# Patient Record
Sex: Male | Born: 1940 | Race: White | Hispanic: No | Smoking: Former smoker
Health system: Southern US, Community
[De-identification: ages and names within clinical notes are randomized; demographics above are authoritative.]

## PROBLEM LIST (undated history)

## (undated) DIAGNOSIS — D369 Benign neoplasm, unspecified site: Secondary | ICD-10-CM

## (undated) DIAGNOSIS — K635 Polyp of colon: Secondary | ICD-10-CM

## (undated) DIAGNOSIS — M1990 Unspecified osteoarthritis, unspecified site: Secondary | ICD-10-CM

## (undated) DIAGNOSIS — C20 Malignant neoplasm of rectum: Secondary | ICD-10-CM

## (undated) DIAGNOSIS — K469 Unspecified abdominal hernia without obstruction or gangrene: Secondary | ICD-10-CM

## (undated) DIAGNOSIS — M25512 Pain in left shoulder: Secondary | ICD-10-CM

## (undated) DIAGNOSIS — J449 Chronic obstructive pulmonary disease, unspecified: Secondary | ICD-10-CM

## (undated) DIAGNOSIS — H269 Unspecified cataract: Secondary | ICD-10-CM

## (undated) DIAGNOSIS — R319 Hematuria, unspecified: Secondary | ICD-10-CM

## (undated) DIAGNOSIS — R251 Tremor, unspecified: Secondary | ICD-10-CM

## (undated) DIAGNOSIS — H3530 Unspecified macular degeneration: Secondary | ICD-10-CM

## (undated) DIAGNOSIS — K5790 Diverticulosis of intestine, part unspecified, without perforation or abscess without bleeding: Secondary | ICD-10-CM

## (undated) DIAGNOSIS — I10 Essential (primary) hypertension: Secondary | ICD-10-CM

## (undated) DIAGNOSIS — Z8619 Personal history of other infectious and parasitic diseases: Secondary | ICD-10-CM

## (undated) DIAGNOSIS — Z8719 Personal history of other diseases of the digestive system: Secondary | ICD-10-CM

## (undated) HISTORY — DX: Chronic obstructive pulmonary disease, unspecified: J44.9

## (undated) HISTORY — PX: HERNIA REPAIR: SHX51

## (undated) HISTORY — DX: Polyp of colon: K63.5

## (undated) HISTORY — DX: Tremor, unspecified: R25.1

## (undated) HISTORY — PX: COLON SURGERY: SHX602

## (undated) HISTORY — PX: EYE SURGERY: SHX253

## (undated) HISTORY — DX: Unspecified osteoarthritis, unspecified site: M19.90

## (undated) HISTORY — DX: Pain in left shoulder: M25.512

## (undated) HISTORY — DX: Personal history of other infectious and parasitic diseases: Z86.19

## (undated) HISTORY — DX: Unspecified macular degeneration: H35.30

## (undated) HISTORY — DX: Unspecified abdominal hernia without obstruction or gangrene: K46.9

## (undated) HISTORY — DX: Unspecified cataract: H26.9

## (undated) HISTORY — PX: OTHER SURGICAL HISTORY: SHX169

## (undated) HISTORY — PX: APPENDECTOMY: SHX54

## (undated) HISTORY — DX: Benign neoplasm, unspecified site: D36.9

## (undated) HISTORY — DX: Malignant neoplasm of rectum: C20

## (undated) HISTORY — DX: Diverticulosis of intestine, part unspecified, without perforation or abscess without bleeding: K57.90

## (undated) HISTORY — PX: TONSILLECTOMY: SUR1361

## (undated) HISTORY — DX: Essential (primary) hypertension: I10

## (undated) HISTORY — DX: Personal history of other diseases of the digestive system: Z87.19

## (undated) HISTORY — DX: Hematuria, unspecified: R31.9

---

## 1992-09-29 DIAGNOSIS — C20 Malignant neoplasm of rectum: Secondary | ICD-10-CM

## 1992-09-29 HISTORY — DX: Malignant neoplasm of rectum: C20

## 2013-12-20 ENCOUNTER — Telehealth: Payer: Self-pay | Admitting: Gastroenterology

## 2013-12-20 NOTE — Telephone Encounter (Signed)
Received 6 pages from [facility], sent to Dr. Deatra Ina on 12/20/13/ss.

## 2014-01-10 ENCOUNTER — Encounter: Payer: Self-pay | Admitting: Gastroenterology

## 2014-01-27 DIAGNOSIS — Z8619 Personal history of other infectious and parasitic diseases: Secondary | ICD-10-CM

## 2014-01-27 HISTORY — DX: Personal history of other infectious and parasitic diseases: Z86.19

## 2014-03-01 ENCOUNTER — Ambulatory Visit: Payer: Self-pay | Admitting: Gastroenterology

## 2014-04-13 ENCOUNTER — Ambulatory Visit: Payer: Self-pay | Admitting: Family Medicine

## 2014-04-18 ENCOUNTER — Telehealth: Payer: Self-pay

## 2014-04-18 NOTE — Telephone Encounter (Signed)
Medication List and allergies:  Reviewed and updated  90 day supply/mail order:  [facility] - [place], [address] Local prescriptions: [id number] [place],  - [place]. AT [place]  Immunization due:  See below  A/P: Personal, family and PSH: Reviewed and updated Tdap- unsure of last vaccine PNA- 2009 Shingles- 2009 CCS- 01/03/13- multiple polyps and diverticular disease; repeat colonoscopy in 1 year--OVERDUE; Dr. Deatra Ina  PSA- not on file    To discuss with provider: Nothing at this time.

## 2014-04-18 NOTE — Telephone Encounter (Signed)
Pt called, call back [phone]

## 2014-04-18 NOTE — Telephone Encounter (Signed)
Left message for call back Non-identifiable  Td-DUE PNA- DUE Z- DUE CCS- 01/03/13- multiple polyps and diverticular disease; repeat colonoscopy in 1 year--OVERDUE; Dr. Deatra Ina PSA- not on file

## 2014-04-19 ENCOUNTER — Ambulatory Visit (INDEPENDENT_AMBULATORY_CARE_PROVIDER_SITE_OTHER): Payer: Medicare HMO | Admitting: Family Medicine

## 2014-04-19 ENCOUNTER — Encounter: Payer: Self-pay | Admitting: Family Medicine

## 2014-04-19 VITALS — BP 128/80 | HR 83 | Temp 98.2°F | Resp 16 | Ht 73.0 in | Wt 226.0 lb

## 2014-04-19 DIAGNOSIS — F32A Depression, unspecified: Secondary | ICD-10-CM

## 2014-04-19 DIAGNOSIS — F329 Major depressive disorder, single episode, unspecified: Secondary | ICD-10-CM | POA: Insufficient documentation

## 2014-04-19 DIAGNOSIS — F3289 Other specified depressive episodes: Secondary | ICD-10-CM

## 2014-04-19 DIAGNOSIS — F419 Anxiety disorder, unspecified: Secondary | ICD-10-CM

## 2014-04-19 MED ORDER — FLUOXETINE HCL 20 MG PO TABS: 20.0000 mg | ORAL_TABLET | Freq: Every day | ORAL | Status: DC

## 2014-04-19 NOTE — Progress Notes (Signed)
Pre visit review using our clinic review tool, if applicable. No additional management support is needed unless otherwise documented below in the visit note. 

## 2014-04-19 NOTE — Progress Notes (Signed)
   Subjective:    Patient ID: Devin [name], male    DOB: 03-12-1941, 73 y.o.   MRN: [id number]  HPI New to establish.  Previous MD- Nancy Fetter Romelle Starcher)  Depression- pt's son died suddenly and wife decided to move to [place].  Pt misses [place] and his close group of friends.  On Lexapro but not taking daily 'i felt weird', Nortriptyline.  Taking hydroxyzine as needed.  Pt was a bell ringer and 'I miss that'.  Admits to being short w/ wife.  Macular degeneration- chronic problem, following w/ ophtho regularly.  ([place])   Review of Systems For ROS see HPI     Objective:   Physical Exam  Vitals reviewed. Constitutional: He is oriented to person, place, and time. He appears well-developed and well-nourished. No distress.  HENT:  Head: Normocephalic and atraumatic.  Neck: Normal range of motion. Neck supple. No thyromegaly present.  Cardiovascular: Normal rate, regular rhythm, normal heart sounds and intact distal pulses.   Pulmonary/Chest: Effort normal and breath sounds normal. No respiratory distress. He has no wheezes. He has no rales.  Musculoskeletal: He exhibits no edema.  Lymphadenopathy:    He has no cervical adenopathy.  Neurological: He is alert and oriented to person, place, and time.  Skin: Skin is warm and dry. No erythema.  Psychiatric: He has a normal mood and affect. His behavior is normal. Thought content normal.          Assessment & Plan:

## 2014-04-19 NOTE — Patient Instructions (Signed)
Follow up in 4-6 weeks to recheck mood STOP the Lexapro (escitalopram) START the Fluoxetine daily in the AM w/ food Please call if you have any side effects, questions or concerns Welcome!  We're glad to have you- it was delightful meeting you! Enjoy the rest of your summer!

## 2014-04-23 NOTE — Assessment & Plan Note (Signed)
New to provider, ongoing for pt.  Recently lost son suddenly and unexpectedly and then left everything familiar to him and moved from [place] to [place].  Pt did not do well on Lexapro.  Will attempt to switch to Prozac and monitor for improvement in sxs.  Will follow closely.

## 2014-05-15 ENCOUNTER — Ambulatory Visit (INDEPENDENT_AMBULATORY_CARE_PROVIDER_SITE_OTHER): Payer: Medicare HMO | Admitting: Gastroenterology

## 2014-05-15 ENCOUNTER — Encounter: Payer: Self-pay | Admitting: Gastroenterology

## 2014-05-15 VITALS — BP 146/98 | HR 84 | Ht 73.0 in | Wt 225.4 lb

## 2014-05-15 DIAGNOSIS — Z860100 Personal history of colon polyps, unspecified: Secondary | ICD-10-CM | POA: Insufficient documentation

## 2014-05-15 DIAGNOSIS — Z85048 Personal history of other malignant neoplasm of rectum, rectosigmoid junction, and anus: Secondary | ICD-10-CM

## 2014-05-15 DIAGNOSIS — K5730 Diverticulosis of large intestine without perforation or abscess without bleeding: Secondary | ICD-10-CM | POA: Insufficient documentation

## 2014-05-15 DIAGNOSIS — Z800 Family history of malignant neoplasm of digestive organs: Secondary | ICD-10-CM | POA: Insufficient documentation

## 2014-05-15 MED ORDER — PEG-KCL-NACL-NASULF-NA ASC-C 100 G PO SOLR: 1.0000 | Freq: Once | ORAL | Status: DC

## 2014-05-15 NOTE — Assessment & Plan Note (Signed)
Last year his colonoscopy described in in adequate prep.  Plan to repeat colonoscopy with prep modification.

## 2014-05-15 NOTE — Progress Notes (Signed)
_                                                                                                                History of Present Illness:  Devin [name] is a pleasant 73 year old white male with history of colon cancer and colon polyps referred for followup colonoscopy.  Colon cancer was resected about 15 years ago.  Colonoscopy last year demonstrated at least 7 polyps.  The 7 mm cecal polyp was adenomatous.  The other polyps were hyperplastic. Prep was described as inadequate and one year followup was recommended.  Diverticulosis was seen as well.  The patient has no GI complaints  Past Medical History  Diagnosis Date  . Tubular adenoma     01/05/2013  . Hyperplastic colon polyp   . Rectal cancer   . Diverticulosis   . Macular degeneration   . Hernia of abdominal cavity   . History of rectal polyps   . Hypertension   . COPD (chronic obstructive pulmonary disease)    Past Surgical History  Procedure Laterality Date  . Mastoid gland removal      left ear  . Appendectomy    . Hernia repair    . Colon surgery     family history includes Aneurysm in his father and son; Breast cancer in his mother; Cancer in his maternal grandmother, mother, and paternal grandmother; Heart attack in his father; Stroke in his paternal grandfather. Current Outpatient Prescriptions  Medication Sig Dispense Refill  . aspirin 81 MG tablet Take 81 mg by mouth daily.      . B Complex-C (B-COMPLEX WITH VITAMIN C) tablet Take 1 tablet by mouth daily.      . calcium carbonate (OS-CAL) 600 MG TABS tablet Take 600 mg by mouth daily with breakfast. + vitamin D      . Cyanocobalamin (VITAMIN B-12) 2000 MCG TBCR Take 1 tablet by mouth daily.      Marland Kitchen FLUoxetine (PROZAC) 20 MG tablet Take 1 tablet (20 mg total) by mouth daily.  30 tablet  3  . hydrOXYzine (ATARAX/VISTARIL) 25 MG tablet Take 25 mg by mouth as needed.      . Lutein 6 MG CAPS Take 1 capsule by mouth daily.      . nortriptyline (PAMELOR) 50 MG  capsule Take 1 capsule by mouth at bedtime.       No current facility-administered medications for this visit.   Allergies as of 05/15/2014  . (No Known Allergies)    reports that he quit smoking about 25 years ago. He has never used smokeless tobacco. He reports that he drinks alcohol. He reports that he does not use illicit drugs.   Review of Systems: Pertinent positive and negative review of systems were noted in the above HPI section. All other review of systems were otherwise negative.  Vital signs were reviewed in today's medical record Physical Exam: General: Well developed , well nourished, no acute distress Skin: anicteric Head: Normocephalic and atraumatic Eyes:  sclerae anicteric, EOMI  Ears: Normal auditory acuity Mouth: No deformity or lesions Neck: Supple, no masses or thyromegaly Lungs: Clear throughout to auscultation Heart: Regular rate and rhythm; no murmurs, rubs or bruits Abdomen: Soft, non tender and non distended. No masses, hepatosplenomegaly or hernias noted. Normal Bowel sounds Rectal:deferred Musculoskeletal: Symmetrical with no gross deformities  Skin: No lesions on visible extremities Pulses:  Normal pulses noted Extremities: No clubbing, cyanosis, edema or deformities noted Neurological: Alert oriented x 4, grossly nonfocal Cervical Nodes:  No significant cervical adenopathy Inguinal Nodes: No significant inguinal adenopathy Psychological:  Alert and cooperative. Normal mood and affect  See Assessment and Plan under Problem List

## 2014-05-15 NOTE — Patient Instructions (Signed)

## 2014-05-18 ENCOUNTER — Encounter: Payer: Self-pay | Admitting: Gastroenterology

## 2014-05-18 ENCOUNTER — Ambulatory Visit (AMBULATORY_SURGERY_CENTER): Payer: Medicare HMO | Admitting: Gastroenterology

## 2014-05-18 VITALS — BP 148/85 | HR 81 | Temp 98.6°F | Resp 16 | Ht 73.0 in | Wt 225.0 lb

## 2014-05-18 DIAGNOSIS — Z85048 Personal history of other malignant neoplasm of rectum, rectosigmoid junction, and anus: Secondary | ICD-10-CM

## 2014-05-18 DIAGNOSIS — K5730 Diverticulosis of large intestine without perforation or abscess without bleeding: Secondary | ICD-10-CM

## 2014-05-18 DIAGNOSIS — D126 Benign neoplasm of colon, unspecified: Secondary | ICD-10-CM

## 2014-05-18 MED ORDER — SODIUM CHLORIDE 0.9 % IV SOLN: 500.0000 mL | INTRAVENOUS | Status: DC

## 2014-05-18 NOTE — Progress Notes (Signed)
A/ox3 pleased with MAC, report to Celia RN 

## 2014-05-18 NOTE — Op Note (Addendum)
[place]  [address]. [place], [address]   COLONOSCOPY PROCEDURE REPORT  PATIENT: Devin [name], Devin [name]  MR#: [id number] BIRTHDATE: 11/23/1940 , 36  yrs. old GENDER: Male ENDOSCOPIST: Inda Castle, MD REFERRED [id number] Wadie Lessen, M.D. PROCEDURE DATE:  05/18/2014 PROCEDURE:   Colonoscopy with snare polypectomy and Submucosal injection, any substance First Screening Colonoscopy - Avg.  risk and is 50 yrs.  old or older - No.  Prior Negative Screening - Now for repeat screening. N/A  History of Adenoma - Now for follow-up colonoscopy & has been > or = to 3 yrs.  N/A History of Adenoma - Now for follow-up colonoscopy & has been > or = to 3 yrs.  No.  It has been less than 3 yrs since last colonoscopy.  Medical reason.  Polyps Removed Today? Yes. ASA CLASS:   Class II INDICATIONS:Patient's personal history of adenomatous colon polyps,  MEDICATIONS: MAC sedation, administered by CRNA and propofol (Diprivan) 300mg  IV  DESCRIPTION OF PROCEDURE:   After the risks benefits and alternatives of the procedure were thoroughly explained, informed consent was obtained.  A digital rectal exam revealed no abnormalities of the rectum.   The LB [id number]  endoscope was introduced through the anus and advanced to the cecum, which was identified by both the appendix and ileocecal valve. No adverse events experienced.   The quality of the prep was excellent using Suprep  The instrument was then slowly withdrawn as the colon was fully examined.      COLON FINDINGS: A sessile polyp measuring 15 mm in size was found in the proximal transverse colon.  A saline Injection was given to lift the mucosal wall.  A polypectomy was performed using snare cautery.  The resection was complete and the polyp tissue was completely retrieved.   A sessile polyp measuring 4 mm in size was found in the distal transverse colon.  A polypectomy was performed with a cold snare.  The resection  was complete and the polyp tissue was completely retrieved.   There was severe diverticulosis noted in the sigmoid with associated muscular hypertrophy and colonic narrowing.   There was moderate scattered diverticulosis noted in the descending colon, transverse colon, ascending colon, and at the cecum.   The colon was otherwise normal.  There was no diverticulosis, inflammation, polyps or cancers unless previously stated.  Retroflexed views revealed no abnormalities. The time to cecum=5 minutes 12 seconds.  Withdrawal time=14 minutes 38 seconds. The scope was withdrawn and the procedure completed. COMPLICATIONS: There were no complications.  ENDOSCOPIC IMPRESSION: 1.   Sessile polyp measuring 15 mm in size was found in the proximal transverse colon; saline was given to lift the mucosal wall.; polypectomy was performed using snare cautery 2.   Sessile polyp measuring 4 mm in size was found in the distal transverse colon; polypectomy was performed with a cold snare 3.   There was severe diverticulosis noted in the sigmoid 4.   There was moderate diverticulosis noted in the descending colon, transverse colon, ascending colon, and at the cecum 5.   The colon was otherwise normal  RECOMMENDATIONS: If the polyp(s) removed today are proven to be adenomatous (pre-cancerous) polyps, you will need a colonoscopy in 3 years. Otherwise you should continue to follow colorectal cancer screening guidelines for "routine risk" patients with a colonoscopy in 10 years.  You will receive a letter within 1-2 weeks with the results of your biopsy as well as final recommendations.  Please call my  office if you have not received a letter after 3 weeks.  eSigned:  Inda Castle, MD 05/24/2014 4:39 PM Revised: 05/24/2014 4:39 PM  cc:   PATIENT NAME:  Misha, Antonini MR#: [id number]

## 2014-05-18 NOTE — Patient Instructions (Signed)
Discharge instructions given with verbal understanding. Handouts on polyps,diverticulosis and hemorrhoids. Resume previous medications. YOU HAD AN ENDOSCOPIC PROCEDURE TODAY AT THE [facility]: Refer to the procedure report that was given to you for any specific questions about what was found during the examination.  If the procedure report does not answer your questions, please call your gastroenterologist to clarify.  If you requested that your care partner not be given the details of your procedure findings, then the procedure report has been included in a sealed envelope for you to review at your convenience later.  YOU SHOULD EXPECT: Some feelings of bloating in the abdomen. Passage of more gas than usual.  Walking can help get rid of the air that was put into your GI tract during the procedure and reduce the bloating. If you had a lower endoscopy (such as a colonoscopy or flexible sigmoidoscopy) you may notice spotting of blood in your stool or on the toilet paper. If you underwent a bowel prep for your procedure, then you may not have a normal bowel movement for a few days.  DIET: Your first meal following the procedure should be a light meal and then it is ok to progress to your normal diet.  A half-sandwich or bowl of soup is an example of a good first meal.  Heavy or fried foods are harder to digest and may make you feel nauseous or bloated.  Likewise meals heavy in dairy and vegetables can cause extra gas to form and this can also increase the bloating.  Drink plenty of fluids but you should avoid alcoholic beverages for 24 hours.  ACTIVITY: Your care partner should take you home directly after the procedure.  You should plan to take it easy, moving slowly for the rest of the day.  You can resume normal activity the day after the procedure however you should NOT DRIVE or use heavy machinery for 24 hours (because of the sedation medicines used during the test).    SYMPTOMS TO REPORT  IMMEDIATELY: A gastroenterologist can be reached at any hour.  During normal business hours, 8:30 AM to 5:00 PM Monday through Friday, call [phone].  After hours and on weekends, please call the GI answering service at [phone] who will take a message and have the physician on call contact you.   Following lower endoscopy (colonoscopy or flexible sigmoidoscopy):  Excessive amounts of blood in the stool  Significant tenderness or worsening of abdominal pains  Swelling of the abdomen that is new, acute  Fever of 100F or higher  FOLLOW UP: If any biopsies were taken you will be contacted by phone or by letter within the next 1-3 weeks.  Call your gastroenterologist if you have not heard about the biopsies in 3 weeks.  Our staff will call the home number listed on your records the next business day following your procedure to check on you and address any questions or concerns that you may have at that time regarding the information given to you following your procedure. This is a courtesy call and so if there is no answer at the home number and we have not heard from you through the emergency physician on call, we will assume that you have returned to your regular daily activities without incident.  SIGNATURES/CONFIDENTIALITY: You and/or your care partner have signed paperwork which will be entered into your electronic medical record.  These signatures attest to the fact that that the information above on your After Visit Summary   has been reviewed and is understood.  Full responsibility of the confidentiality of this discharge information lies with you and/or your care-partner. 

## 2014-05-18 NOTE — Progress Notes (Signed)
Called to room to assist during endoscopic procedure.  Patient ID and intended procedure confirmed with present staff. Received instructions for my participation in the procedure from the performing physician.  

## 2014-05-19 ENCOUNTER — Telehealth: Payer: Self-pay

## 2014-05-19 NOTE — Telephone Encounter (Signed)
  Follow up Call-  Call back number 05/18/2014  Post procedure Call Back phone  # [phone]  Permission to leave phone message Yes     Patient questions:  Do you have a fever, pain , or abdominal swelling? No. Pain Score  0 *  Have you tolerated food without any problems? Yes.    Have you been able to return to your normal activities? Yes.    Do you have any questions about your discharge instructions: Diet   No. Medications  No. Follow up visit  No.  Do you have questions or concerns about your Care? No.  Actions: * If pain score is 4 or above: No action needed, pain <4.  Per the pt. "everything was splendid". maw

## 2014-05-24 ENCOUNTER — Encounter: Payer: Self-pay | Admitting: Gastroenterology

## 2014-05-25 ENCOUNTER — Ambulatory Visit: Payer: Medicare HMO | Admitting: Family Medicine

## 2014-05-25 ENCOUNTER — Encounter: Payer: Self-pay | Admitting: Family Medicine

## 2014-05-25 ENCOUNTER — Ambulatory Visit (INDEPENDENT_AMBULATORY_CARE_PROVIDER_SITE_OTHER): Payer: Medicare HMO | Admitting: Family Medicine

## 2014-05-25 VITALS — BP 140/76 | HR 81 | Temp 98.0°F | Resp 16 | Wt 221.4 lb

## 2014-05-25 DIAGNOSIS — F329 Major depressive disorder, single episode, unspecified: Secondary | ICD-10-CM

## 2014-05-25 DIAGNOSIS — R079 Chest pain, unspecified: Secondary | ICD-10-CM

## 2014-05-25 DIAGNOSIS — F32A Depression, unspecified: Secondary | ICD-10-CM

## 2014-05-25 DIAGNOSIS — R030 Elevated blood-pressure reading, without diagnosis of hypertension: Secondary | ICD-10-CM

## 2014-05-25 DIAGNOSIS — K219 Gastro-esophageal reflux disease without esophagitis: Secondary | ICD-10-CM | POA: Insufficient documentation

## 2014-05-25 DIAGNOSIS — F3289 Other specified depressive episodes: Secondary | ICD-10-CM

## 2014-05-25 DIAGNOSIS — R072 Precordial pain: Secondary | ICD-10-CM

## 2014-05-25 DIAGNOSIS — IMO0001 Reserved for inherently not codable concepts without codable children: Secondary | ICD-10-CM

## 2014-05-25 MED ORDER — OMEPRAZOLE 20 MG PO CPDR: 20.0000 mg | DELAYED_RELEASE_CAPSULE | Freq: Every day | ORAL | Status: DC

## 2014-05-25 MED ORDER — NORTRIPTYLINE HCL 50 MG PO CAPS: 50.0000 mg | ORAL_CAPSULE | Freq: Every day | ORAL | Status: DC

## 2014-05-25 NOTE — Assessment & Plan Note (Signed)
New.  Atypical for cardiac chest pain but due to inverted T waves in V1-3 and no previous to compare, will refer to cards.  Suspect this is due to GERD.  Will start PPI.  Reviewed supportive care and red flags that should prompt return.  Pt expressed understanding and is in agreement w/ plan.

## 2014-05-25 NOTE — Progress Notes (Signed)
   Subjective:    Patient ID: Devin [name], male    DOB: [birth year], 73 y.o.   MRN: [id number]  HPI Depression- pt was started on Prozac at last visit and pt feels this is working to improve mood.  Pt would like to continue.  Pt has been taking Nortriptyline 50mg  for Migraine prophylaxis- pharmacy mistakenly dropped script to 25mg  at last refill.  Elevated BP- pt reports BPs are labile.  Denies SOB, HAs, edema.  CP- pt reports pain improves w/ belching.  Occuring overnight.  Taking Zantac w/ some relief.  Eating Raddishes and seltzer water.  Sleeping w/ head of bed elevated.  No pattern to chest pain.  Pain extends across anterior chest.  Pt doesn't feel this is heart pain- not radiating to neck or shoulder.  'worst kind of indigestion you can have'.   Review of Systems For ROS see HPI     Objective:   Physical Exam  Vitals reviewed. Constitutional: He is oriented to person, place, and time. He appears well-developed and well-nourished. No distress.  HENT:  Head: Normocephalic and atraumatic.  Eyes: Conjunctivae and EOM are normal. Pupils are equal, round, and reactive to light.  Neck: Normal range of motion. Neck supple. No thyromegaly present.  Cardiovascular: Normal rate, regular rhythm, normal heart sounds and intact distal pulses.   No murmur heard. Pulmonary/Chest: Effort normal and breath sounds normal. No respiratory distress.  Abdominal: Soft. Bowel sounds are normal. He exhibits no distension.  Musculoskeletal: He exhibits no edema.  Lymphadenopathy:    He has no cervical adenopathy.  Neurological: He is alert and oriented to person, place, and time. No cranial nerve deficit.  Skin: Skin is warm and dry.  Psychiatric: He has a normal mood and affect. His behavior is normal.          Assessment & Plan:

## 2014-05-25 NOTE — Assessment & Plan Note (Signed)
Improved since starting Prozac.  Will continue at this time.

## 2014-05-25 NOTE — Progress Notes (Signed)
Pre visit review using our clinic review tool, if applicable. No additional management support is needed unless otherwise documented below in the visit note. 

## 2014-05-25 NOTE — Assessment & Plan Note (Signed)
Deteriorated.  Will stop pt's Zantac and start PPI.  Suspect this is cause for pt's CP.  Will monitor closely.

## 2014-05-25 NOTE — Patient Instructions (Signed)
Schedule your complete physical in 6 months We'll call you with your cardiology appt Continue the Prozac daily Start the Omeprazole daily to reduce acid production and decrease indigestion Call with any questions or concerns Happy Labor Day!

## 2014-05-25 NOTE — Assessment & Plan Note (Signed)
New.  Only mildly elevated today.  No need to start meds but will monitor closely for changes.

## 2014-06-21 ENCOUNTER — Encounter: Payer: Self-pay | Admitting: Family Medicine

## 2014-06-21 ENCOUNTER — Ambulatory Visit (INDEPENDENT_AMBULATORY_CARE_PROVIDER_SITE_OTHER): Payer: Medicare HMO | Admitting: Family Medicine

## 2014-06-21 VITALS — BP 130/82 | HR 84 | Temp 98.1°F | Resp 16 | Wt 225.0 lb

## 2014-06-21 DIAGNOSIS — J209 Acute bronchitis, unspecified: Secondary | ICD-10-CM | POA: Insufficient documentation

## 2014-06-21 DIAGNOSIS — J208 Acute bronchitis due to other specified organisms: Secondary | ICD-10-CM

## 2014-06-21 MED ORDER — GUAIFENESIN-CODEINE 100-10 MG/5ML PO SYRP: 10.0000 mL | ORAL_SOLUTION | Freq: Three times a day (TID) | ORAL | Status: DC | PRN

## 2014-06-21 MED ORDER — AMOXICILLIN 875 MG PO TABS: 875.0000 mg | ORAL_TABLET | Freq: Two times a day (BID) | ORAL | Status: DC

## 2014-06-21 MED ORDER — BENZONATATE 200 MG PO CAPS: 200.0000 mg | ORAL_CAPSULE | Freq: Three times a day (TID) | ORAL | Status: DC | PRN

## 2014-06-21 NOTE — Progress Notes (Signed)
   Subjective:    Patient ID: Devin [name], male    DOB: 1941/09/07, 73 y.o.   MRN: [id number]  HPI URI- sxs started 5 days ago w/ 'hacking cough'.  Pt has hx of 'walking PNA'.  No fevers.  + maxillary sinus pain.  No ear pain.  Cough is productive during the day and dry at night.  + sick contacts on cruise.   Review of Systems For ROS see HPI     Objective:   Physical Exam  Vitals reviewed. Constitutional: He appears well-developed and well-nourished. No distress.  HENT:  Head: Normocephalic and atraumatic.  Right Ear: Tympanic membrane normal.  Left Ear: Tympanic membrane normal.  Nose: No mucosal edema or rhinorrhea. Right sinus exhibits no maxillary sinus tenderness and no frontal sinus tenderness. Left sinus exhibits no maxillary sinus tenderness and no frontal sinus tenderness.  Mouth/Throat: Mucous membranes are normal. No oropharyngeal exudate, posterior oropharyngeal edema or posterior oropharyngeal erythema.  Eyes: Conjunctivae and EOM are normal. Pupils are equal, round, and reactive to light.  Neck: Normal range of motion. Neck supple.  Cardiovascular: Normal rate, regular rhythm and normal heart sounds.   Pulmonary/Chest: Effort normal. No respiratory distress. He has no wheezes.  + hacking cough w/ questionable crackles of RLL  Lymphadenopathy:    He has no cervical adenopathy.  Skin: Skin is warm and dry.          Assessment & Plan:

## 2014-06-21 NOTE — Progress Notes (Signed)
Pre visit review using our clinic review tool, if applicable. No additional management support is needed unless otherwise documented below in the visit note. 

## 2014-06-21 NOTE — Assessment & Plan Note (Signed)
New.  Pt's cough is severe and he has faint crackles in RLL.  Due to hx of walking PNA and recent travel, will treat w/ abx.  Cough meds prn.  Reviewed supportive care and red flags that should prompt return.  Pt expressed understanding and is in agreement w/ plan.

## 2014-06-21 NOTE — Patient Instructions (Signed)
Follow up as needed Start the Amoxicillin twice daily x10 days Drink plenty of fluids Use the cough syrup for nights- will cause drowsiness Use the cough pills for daytime- combine w/ Mucinex DM for better relief Call with any questions or concerns Hang in there!

## 2014-07-12 ENCOUNTER — Institutional Professional Consult (permissible substitution): Payer: Medicare HMO | Admitting: Cardiology

## 2014-07-24 ENCOUNTER — Telehealth: Payer: Self-pay | Admitting: Family Medicine

## 2014-07-24 MED ORDER — NORTRIPTYLINE HCL 50 MG PO CAPS: 50.0000 mg | ORAL_CAPSULE | Freq: Every day | ORAL | Status: DC

## 2014-07-24 MED ORDER — FLUOXETINE HCL 20 MG PO TABS: 20.0000 mg | ORAL_TABLET | Freq: Every day | ORAL | Status: DC

## 2014-07-24 MED ORDER — OMEPRAZOLE 20 MG PO CPDR: 20.0000 mg | DELAYED_RELEASE_CAPSULE | Freq: Every day | ORAL | Status: DC

## 2014-07-24 NOTE — Telephone Encounter (Signed)
Caller name: Chayim Relation to pt: Call back number:[phone] Pharmacy: [facility]  Reason for call:  Pt wants the list of his medications with refill dates to be sent to [facility] order.

## 2014-07-24 NOTE — Telephone Encounter (Signed)
meds filled

## 2014-07-27 ENCOUNTER — Ambulatory Visit (INDEPENDENT_AMBULATORY_CARE_PROVIDER_SITE_OTHER): Payer: Medicare HMO | Admitting: Family Medicine

## 2014-07-27 ENCOUNTER — Encounter: Payer: Self-pay | Admitting: Family Medicine

## 2014-07-27 VITALS — BP 138/84 | HR 83 | Temp 98.1°F | Resp 16 | Wt 231.4 lb

## 2014-07-27 DIAGNOSIS — M25561 Pain in right knee: Secondary | ICD-10-CM

## 2014-07-27 DIAGNOSIS — M25562 Pain in left knee: Secondary | ICD-10-CM

## 2014-07-27 DIAGNOSIS — Z79899 Other long term (current) drug therapy: Secondary | ICD-10-CM

## 2014-07-27 DIAGNOSIS — Z23 Encounter for immunization: Secondary | ICD-10-CM

## 2014-07-27 MED ORDER — NORTRIPTYLINE HCL 50 MG PO CAPS
50.0000 mg | ORAL_CAPSULE | Freq: Every day | ORAL | Status: DC
Start: 2014-07-27 — End: 2015-02-08

## 2014-07-27 MED ORDER — OMEPRAZOLE 20 MG PO CPDR: 20.0000 mg | DELAYED_RELEASE_CAPSULE | Freq: Every day | ORAL | Status: DC

## 2014-07-27 MED ORDER — FLUOXETINE HCL 20 MG PO TABS: 20.0000 mg | ORAL_TABLET | Freq: Every day | ORAL | Status: DC

## 2014-07-27 NOTE — Assessment & Plan Note (Signed)
New to provider, ongoing for pt.  Was getting injxns w/ Dr Abner Greenspan in [facility] but would like local ortho office.  Referral placed.

## 2014-07-27 NOTE — Patient Instructions (Signed)
Follow up as scheduled Continue the meds on your list as directed w/ exception of Fluoxetine.  Take Fluoxetine every other day until you run out and then stop Start a multivitamin daily- any multivitamin will do- buy over the counter I sent the [facility] (for reflux[facility] so you can pick this up while waiting on mail order Call with any questions or concerns Hang in there!!!

## 2014-07-27 NOTE — Progress Notes (Signed)
Pre visit review using our clinic review tool, if applicable. No additional management support is needed unless otherwise documented below in the visit note. 

## 2014-07-27 NOTE — Addendum Note (Signed)
Addended by: Kris Hartmann on: 07/27/2014 04:48 PM   Modules accepted: Orders

## 2014-07-27 NOTE — Progress Notes (Signed)
   Subjective:    Patient ID: Devin [name], male    DOB: Jun 01, 1941, 73 y.o.   MRN: [id number]  HPI Med management- pt is very confused by his prescriptions and which one is he supposed to take vs which ones are done and which ones are optional.  Bilateral hip and knee pain- hx of arthritis and bursitis.  Was getting injxns w/ Dr Abner Greenspan but would like somewhere closer.   Review of Systems For ROS see HPI     Objective:   Physical Exam  Vitals reviewed. Constitutional: He is oriented to person, place, and time. He appears well-developed and well-nourished. No distress.  HENT:  Head: Normocephalic and atraumatic.  Neurological: He is alert and oriented to person, place, and time.  Skin: Skin is warm and dry.  Psychiatric: He has a normal mood and affect. His behavior is normal. Judgment and thought content normal.          Assessment & Plan:

## 2014-07-27 NOTE — Assessment & Plan Note (Signed)
New.  Reviewed med list w/ pt and removed all completed or expired meds.  Went through pt's many vitamins and supplements and discussed which were necessary (Ca+Vit D, Lutein, and MVI) and that he could stop all others.  Pt interested in weaning off Prozac- instructions given on how to do this.  Pt appreciative of help.

## 2014-08-09 ENCOUNTER — Ambulatory Visit (INDEPENDENT_AMBULATORY_CARE_PROVIDER_SITE_OTHER): Payer: Medicare HMO | Admitting: Cardiology

## 2014-08-09 ENCOUNTER — Encounter: Payer: Self-pay | Admitting: Cardiology

## 2014-08-09 VITALS — BP 140/90 | HR 91 | Ht 74.0 in | Wt 227.1 lb

## 2014-08-09 DIAGNOSIS — Z8249 Family history of ischemic heart disease and other diseases of the circulatory system: Secondary | ICD-10-CM

## 2014-08-09 DIAGNOSIS — R0989 Other specified symptoms and signs involving the circulatory and respiratory systems: Secondary | ICD-10-CM | POA: Insufficient documentation

## 2014-08-09 DIAGNOSIS — R072 Precordial pain: Secondary | ICD-10-CM

## 2014-08-09 DIAGNOSIS — Z01812 Encounter for preprocedural laboratory examination: Secondary | ICD-10-CM

## 2014-08-09 NOTE — Assessment & Plan Note (Signed)
As outlined under chest pain CTA will include abdominal aorta.

## 2014-08-09 NOTE — Assessment & Plan Note (Signed)
Symptoms are atypical. Plan exercise treadmill for risk stratification. He has an extremely strong family history of thoracic aortic dissection in both his father and son. We will arrange a CTA to evaluate his thoracic aorta. Given abdominal bruit the abdominal aorta will be included.

## 2014-08-09 NOTE — Progress Notes (Signed)
HPI: 73 year old male for evaluation of chest pain. Patient has no prior cardiac history. He does note that his father and son both died of thoracic aortic aneurysms and dissections. The patient has had intermittent chest pain for several years. It is diffuse and described as a sharp pain. No radiation. Typically occurs with stress and "eating poorly". Symptoms are not pleuritic or positional. Some relief with belching. Typically lasts several hours and resolved spontaneously. He does have dyspnea on exertion that he attributes to COPD. No orthopnea, PND, pedal edema. No exertional chest pain.  Current Outpatient Prescriptions  Medication Sig Dispense Refill  . aspirin 81 MG tablet Take 81 mg by mouth daily.    . calcium carbonate (OS-CAL) 600 MG TABS tablet Take 600 mg by mouth daily with breakfast. + vitamin D    . FLUoxetine (PROZAC) 20 MG tablet Take 1 tablet (20 mg total) by mouth daily. 90 tablet 1  . Lutein 6 MG CAPS Take 1 capsule by mouth daily.    . meloxicam (MOBIC) 7.5 MG tablet Take one tablet once or twice daily as needed for joint pain.    . Multiple Vitamins-Minerals (MULTIVITAMIN WITH MINERALS) tablet Take 1 tablet by mouth daily.    . nortriptyline (PAMELOR) 50 MG capsule Take 1 capsule (50 mg total) by mouth at bedtime. For sleep/anxiety 90 capsule 1  . Omega-3 Fatty Acids (FISH OIL) 1000 MG CAPS Take by mouth.    Marland Kitchen omeprazole (PRILOSEC) 20 MG capsule Take 1 capsule (20 mg total) by mouth daily. For reflux 30 capsule 1   No current facility-administered medications for this visit.    No Known Allergies   Past Medical History  Diagnosis Date  . Tubular adenoma     01/05/2013  . Hyperplastic colon polyp   . Rectal cancer   . Diverticulosis   . Macular degeneration   . Hernia of abdominal cavity   . History of rectal polyps   . Hypertension   . COPD (chronic obstructive pulmonary disease)   . H/O [facility] spotted fever 01/27/14    Past Surgical History    Procedure Laterality Date  . Mastoid gland removal      left ear  . Appendectomy    . Hernia repair    . Colon surgery    . Tonsillectomy      History   Social History  . Marital Status: Married    Spouse Name: N/A    Number of Children: 2  . Years of Education: N/A   Occupational History  . Retired    Social History Main Topics  . Smoking status: Former Smoker    Quit date: 04/19/1989  . Smokeless tobacco: Never Used  . Alcohol Use: 8.4 oz/week    14 Glasses of wine per week  . Drug Use: No  . Sexual Activity: Not on file   Other Topics Concern  . Not on file   Social History Narrative    Family History  Problem Relation Age of Onset  . Breast cancer Mother   . Aneurysm Father   . Heart attack Father   . Aneurysm Son   . Stroke Paternal Grandfather   . Cancer Paternal Grandmother   . Cancer Maternal Grandmother   . Cancer Mother     Colon cancer    ROS: no fevers or chills, productive cough, hemoptysis, dysphasia, odynophagia, melena, hematochezia, dysuria, hematuria, rash, seizure activity, orthopnea, PND, pedal edema, claudication. Remaining systems are negative.  Physical Exam:   Blood pressure 140/90, pulse 91, height 6\' 2"  (1.88 m), weight 227 lb 1.9 oz (103.021 kg).  General:  Well developed/well nourished in NAD Skin warm/dry Patient not depressed No peripheral clubbing Back-normal HEENT-normal/normal eyelids Neck supple/normal carotid upstroke bilaterally; no bruits; no JVD; no thyromegaly chest - CTA/ normal expansion CV - RRR/normal S1 and S2; no murmurs, rubs or gallops;  PMI nondisplaced Abdomen -NT/ND, no HSM, no mass, + bowel sounds, no bruit 2+ femoral pulses, positive bruit Ext-no edema, chords, 2+ DP Neuro-grossly nonfocal  ECG 05/25/2014-sinus rhythm, RV conduction delay. No ST changes.  Sinus rhythm at a rate of 91. No ST changes. RV conduction delay.

## 2014-08-09 NOTE — Assessment & Plan Note (Signed)
I have asked the patient to follow his blood pressure at home. Given his family history of thoracic aortic aneurysm goal systolic blood pressure should be less than 461 and diastolic less than 80. He will track this and medications can be initiated by primary care as indicated.

## 2014-08-09 NOTE — Patient Instructions (Addendum)
Your physician recommends that you schedule a follow-up appointment in: AS NEEDED PENDING TEST RESULTS  Your physician has requested that you have an exercise tolerance test. For further information please visit [email or web address]. Please also follow instruction sheet, as given.   Non-Cardiac CT Angiography (CTA), is a special type of CT scan that uses a computer to produce multi-dimensional views of major blood vessels throughout the body. In CT angiography, a contrast material is injected through an IV to help visualize the blood vessels CHEST, ABDOMEN AND PELVIS      Exercise Stress Electrocardiogram An exercise stress electrocardiogram is a test to check how blood flows to your heart. It is done to find areas of poor blood flow. You will need to walk on a treadmill for this test. The electrocardiogram will record your heartbeat when you are at rest and when you are exercising. BEFORE THE PROCEDURE  Do not have drinks with caffeine or foods with caffeine for 24 hours before the test, or as told by your doctor. This includes coffee, tea (even decaf tea), sodas, chocolate, and cocoa.  Follow your doctor's instructions about eating and drinking before the test.  Ask your doctor what medicines you should or should not take before the test. Take your medicines with water unless told by your doctor not to.  If you use an inhaler, bring it with you to the test.  Bring a snack to eat after the test.  Do not  smoke for 4 hours before the test.  Do not put lotions, powders, creams, or oils on your chest before the test.  Wear comfortable shoes and clothing. PROCEDURE  You will have patches put on your chest. Small areas of your chest may need to be shaved. Wires will be connected to the patches.  Your heart rate will be watched while you are resting and while you are exercising.  You will walk on the treadmill. The treadmill will slowly get faster to raise your heart rate.  The test will  take about 1-2 hours. AFTER THE PROCEDURE  Your heart rate and blood pressure will be watched after the test.  You may return to your normal diet, activities, and medicines or as told by your doctor. Document Released: 03/03/2008 Document Revised: 01/30/2014 Document Reviewed: 05/23/2013 [facility] Patient Information 2015 [place], [place]. This information is not intended to replace advice given to you by your health care provider. Make sure you discuss any questions you have with your health care provider.

## 2014-08-16 ENCOUNTER — Other Ambulatory Visit: Payer: Commercial Managed Care - HMO

## 2014-08-28 ENCOUNTER — Telehealth: Payer: Self-pay | Admitting: *Deleted

## 2014-08-28 NOTE — Telephone Encounter (Signed)
Received medical records from [facility]. Records forwarded to Dr. Birdie Riddle. JG//CMA

## 2014-10-02 ENCOUNTER — Ambulatory Visit (INDEPENDENT_AMBULATORY_CARE_PROVIDER_SITE_OTHER): Payer: Medicare HMO | Admitting: Physician Assistant

## 2014-10-02 ENCOUNTER — Encounter: Payer: Self-pay | Admitting: Physician Assistant

## 2014-10-02 VITALS — BP 165/97 | HR 83 | Temp 98.5°F | Resp 16 | Ht 74.0 in | Wt 235.2 lb

## 2014-10-02 DIAGNOSIS — F1010 Alcohol abuse, uncomplicated: Secondary | ICD-10-CM | POA: Insufficient documentation

## 2014-10-02 DIAGNOSIS — F329 Major depressive disorder, single episode, unspecified: Secondary | ICD-10-CM

## 2014-10-02 DIAGNOSIS — I10 Essential (primary) hypertension: Secondary | ICD-10-CM

## 2014-10-02 DIAGNOSIS — F32A Depression, unspecified: Secondary | ICD-10-CM

## 2014-10-02 LAB — BASIC METABOLIC PANEL WITH GFR
BUN: 14 mg/dL (ref 6–23)
CO2: 27 mEq/L (ref 19–32)
Calcium: 9.3 mg/dL (ref 8.4–10.5)
Chloride: 102 mEq/L (ref 96–112)
Creat: 0.8 mg/dL (ref 0.50–1.35)
GFR, Est African American: >89 mL/min
GFR, Est Non African American: 89 mL/min
GLUCOSE: 95 mg/dL (ref 70–99)
POTASSIUM: 4.6 meq/L (ref 3.5–5.3)
SODIUM: 141 meq/L (ref 135–145)

## 2014-10-02 MED ORDER — LISINOPRIL 10 MG PO TABS: 10.0000 mg | ORAL_TABLET | Freq: Every day | ORAL | Status: DC

## 2014-10-02 MED ORDER — FLUOXETINE HCL 20 MG PO TABS: 40.0000 mg | ORAL_TABLET | Freq: Every day | ORAL | Status: DC

## 2014-10-02 NOTE — Patient Instructions (Signed)
Please start taking 40 mg Prozac (2 tablets) daily.  Continue the Pamelor at bedtime.  Attempt to decrease alcohol consumption as it is causing the flare-up of your reflux and increasing your BP.  It can also cause adverse reactions when taken with your current medications.  Please start the lisinopril daily.  Read the DASh diet below.  Follow-up with Dr. Birdie Riddle in 3-4 weeks.  DASH Eating Plan DASH stands for "Dietary Approaches to Stop Hypertension." The DASH eating plan is a healthy eating plan that has been shown to reduce high blood pressure (hypertension). Additional health benefits may include reducing the risk of type 2 diabetes mellitus, heart disease, and stroke. The DASH eating plan may also help with weight loss. WHAT DO I NEED TO KNOW ABOUT THE DASH EATING PLAN? For the DASH eating plan, you will follow these general guidelines:  Choose foods with a percent daily value for sodium of less than 5% (as listed on the food label).  Use salt-free seasonings or herbs instead of table salt or sea salt.  Check with your health care provider or pharmacist before using salt substitutes.  Eat lower-sodium products, often labeled as "lower sodium" or "no salt added."  Eat fresh foods.  Eat more vegetables, fruits, and low-fat dairy products.  Choose whole grains. Look for the word "whole" as the first word in the ingredient list.  Choose fish and skinless chicken or [place] more often than red meat. Limit fish, poultry, and meat to 6 oz (170 g) each day.  Limit sweets, desserts, sugars, and sugary drinks.  Choose heart-healthy fats.  Limit cheese to 1 oz (28 g) per day.  Eat more home-cooked food and less restaurant, buffet, and fast food.  Limit fried foods.  Cook foods using methods other than frying.  Limit canned vegetables. If you do use them, rinse them well to decrease the sodium.  When eating at a restaurant, ask that your food be prepared with less salt, or no salt if  possible. WHAT FOODS CAN I EAT? Seek help from a dietitian for individual calorie needs. Grains Whole grain or whole wheat bread. Brown rice. Whole grain or whole wheat pasta. Quinoa, bulgur, and whole grain cereals. Low-sodium cereals. Corn or whole wheat flour tortillas. Whole grain cornbread. Whole grain crackers. Low-sodium crackers. Vegetables Fresh or frozen vegetables (raw, steamed, roasted, or grilled). Low-sodium or reduced-sodium tomato and vegetable juices. Low-sodium or reduced-sodium tomato sauce and paste. Low-sodium or reduced-sodium canned vegetables.  Fruits All fresh, canned (in natural juice), or frozen fruits. Meat and Other Protein Products Ground beef (85% or leaner), grass-fed beef, or beef trimmed of fat. Skinless chicken or [place]. Ground chicken or [place]. Pork trimmed of fat. All fish and seafood. Eggs. Dried beans, peas, or lentils. Unsalted nuts and seeds. Unsalted canned beans. Dairy Low-fat dairy products, such as skim or 1% milk, 2% or reduced-fat cheeses, low-fat ricotta or cottage cheese, or plain low-fat yogurt. Low-sodium or reduced-sodium cheeses. Fats and Oils Tub margarines without trans fats. Light or reduced-fat mayonnaise and salad dressings (reduced sodium). Avocado. Safflower, olive, or canola oils. Natural peanut or almond butter. Other Unsalted popcorn and pretzels. The items listed above may not be a complete list of recommended foods or beverages. Contact your dietitian for more options. WHAT FOODS ARE NOT RECOMMENDED? Grains White bread. White pasta. White rice. Refined cornbread. Bagels and croissants. Crackers that contain trans fat. Vegetables Creamed or fried vegetables. Vegetables in a cheese sauce. Regular canned vegetables. Regular canned tomato  sauce and paste. Regular tomato and vegetable juices. Fruits Dried fruits. Canned fruit in light or heavy syrup. Fruit juice. Meat and Other Protein Products Fatty cuts of meat. Ribs, chicken  wings, bacon, sausage, bologna, salami, chitterlings, fatback, hot dogs, bratwurst, and packaged luncheon meats. Salted nuts and seeds. Canned beans with salt. Dairy Whole or 2% milk, cream, half-and-half, and cream cheese. Whole-fat or sweetened yogurt. Full-fat cheeses or blue cheese. Nondairy creamers and whipped toppings. Processed cheese, cheese spreads, or cheese curds. Condiments Onion and garlic salt, seasoned salt, table salt, and sea salt. Canned and packaged gravies. Worcestershire sauce. Tartar sauce. Barbecue sauce. Teriyaki sauce. Soy sauce, including reduced sodium. Steak sauce. Fish sauce. Oyster sauce. Cocktail sauce. Horseradish. Ketchup and mustard. Meat flavorings and tenderizers. Bouillon cubes. Hot sauce. Tabasco sauce. Marinades. Taco seasonings. Relishes. Fats and Oils Butter, stick margarine, lard, shortening, ghee, and bacon fat. Coconut, palm kernel, or palm oils. Regular salad dressings. Other Pickles and olives. Salted popcorn and pretzels. The items listed above may not be a complete list of foods and beverages to avoid. Contact your dietitian for more information. WHERE CAN I FIND MORE INFORMATION? National Heart, Lung, and Blood Institute: travelstabloid.com Document Released: 09/04/2011 Document Revised: 01/30/2014 Document Reviewed: 07/20/2013 [facility] Patient Information 2015 [place], [place]. This information is not intended to replace advice given to you by your health care provider. Make sure you discuss any questions you have with your health care provider.

## 2014-10-02 NOTE — Assessment & Plan Note (Signed)
Likely culprit of GERD exacerbation and worsening of hypertension. Will begin to wean down to 2 glasses of wine per night and whiskey only on special occasions.  Patient feels he will be able to successfully do this with the support of his wife.  Has follow-up in 4 weeks.

## 2014-10-02 NOTE — Progress Notes (Signed)
Patient presents to clinic today with his wife c/o markedly elevated BP readings at home and at the gym.  Patient with history of hypertension (mild), previously controlled with diet and exercise.  Is followed by Cardiology as patient has significant family history of aortic dissection.  Cardiology recommended BP be kept < 130/80.  BP in clinic today at 165/97.  Endorses BP readings at home and gym averaging 140-180/90-110.   Denies chest pain, palpitations, lightheadedness or dizziness.  Does endorse intermittent blurring of vision. Patient also with history of GERD and anxiety/depression that he states has worsened over the past few weeks despite taking medications as directed.  Wife endorses that the patient has increased alcohol consumption.  Endorses patient has began drinking 4 glasses of wine and 2-3 glasses of whiskey most nights of the week.  Patient endorses this time of year is hard for him ever since he lost his son a few years ago.  Endorses depressed mood and increased anxiety.  Endorses some anhedonia.  Denies panic attack or SI/HI.  Is currently seeing a friend who is a Social worker.  Past Medical History  Diagnosis Date  . Tubular adenoma     01/05/2013  . Hyperplastic colon polyp   . Rectal cancer   . Diverticulosis   . Macular degeneration   . Hernia of abdominal cavity   . History of rectal polyps   . Hypertension   . COPD (chronic obstructive pulmonary disease)   . H/O [facility] spotted fever 01/27/14    Current Outpatient Prescriptions on File Prior to Visit  Medication Sig Dispense Refill  . aspirin 81 MG tablet Take 81 mg by mouth daily.    . calcium carbonate (OS-CAL) 600 MG TABS tablet Take 600 mg by mouth daily with breakfast. + vitamin D    . Lutein 6 MG CAPS Take 1 capsule by mouth daily.    . meloxicam (MOBIC) 7.5 MG tablet Take one tablet once or twice daily as needed for joint pain.    . Multiple Vitamins-Minerals (MULTIVITAMIN WITH MINERALS) tablet Take 1  tablet by mouth daily.    . nortriptyline (PAMELOR) 50 MG capsule Take 1 capsule (50 mg total) by mouth at bedtime. For sleep/anxiety 90 capsule 1  . Omega-3 Fatty Acids (FISH OIL) 1000 MG CAPS Take by mouth.    Marland Kitchen omeprazole (PRILOSEC) 20 MG capsule Take 1 capsule (20 mg total) by mouth daily. For reflux 30 capsule 1   No current facility-administered medications on file prior to visit.    No Known Allergies  Family History  Problem Relation Age of Onset  . Breast cancer Mother   . Aneurysm Father   . Heart attack Father   . Aneurysm Son   . Stroke Paternal Grandfather   . Cancer Paternal Grandmother   . Cancer Maternal Grandmother   . Cancer Mother     Colon cancer    History   Social History  . Marital Status: Married    Spouse Name: N/A    Number of Children: 2  . Years of Education: N/A   Occupational History  . Retired    Social History Main Topics  . Smoking status: Former Smoker    Quit date: 04/19/1989  . Smokeless tobacco: Never Used  . Alcohol Use: 8.4 oz/week    14 Glasses of wine per week  . Drug Use: No  . Sexual Activity: None   Other Topics Concern  . None   Social History Narrative  Review of Systems - See HPI.  All other ROS are negative.  BP 165/97 mmHg  Pulse 83  Temp(Src) 98.5 F (36.9 C) (Oral)  Resp 16  Ht 6\' 2"  (1.88 m)  Wt 235 lb 4 oz (106.709 kg)  BMI 30.19 kg/m2  SpO2 96%  Physical Exam  Constitutional: He is oriented to person, place, and time and well-developed, well-nourished, and in no distress.  HENT:  Head: Normocephalic and atraumatic.  Eyes: Conjunctivae are normal. Pupils are equal, round, and reactive to light.  Neck: Neck supple.  Cardiovascular: Normal rate, regular rhythm, normal heart sounds and intact distal pulses.   Pulmonary/Chest: Breath sounds normal. No respiratory distress. He has no wheezes. He has no rales. He exhibits no tenderness.  Neurological: He is alert and oriented to person, place, and  time.  Skin: Skin is warm and dry. No rash noted.  Psychiatric: Affect normal.  Vitals reviewed.  Assessment/Plan: Essential hypertension Will begin Lisinopril 10 mg daily.  DASH diet encouraged. Handout given. Patient also encouraged to begin weaning off of significant alcohol intake as this affects vasculature and therefore BP.  Follow-up in 3-4 weeks with PCP for reassessment.  Depression Increase Prozac to 40 mg daily. Continue Pamelor. Will not give benzodiazepines due to patient's alcohol use.  Iterated to wife it is inappropriate to give her Klonopin to patient for use as it is not prescribed to him and has adverse effects when taken with alcohol. Follow-up 3-4 weeks.  Alcohol use disorder, mild, abuse Likely culprit of GERD exacerbation and worsening of hypertension. Will begin to wean down to 2 glasses of wine per night and whiskey only on special occasions.  Patient feels he will be able to successfully do this with the support of his wife.  Has follow-up in 4 weeks.

## 2014-10-02 NOTE — Assessment & Plan Note (Signed)
Increase Prozac to 40 mg daily. Continue Pamelor. Will not give benzodiazepines due to patient's alcohol use.  Iterated to wife it is inappropriate to give her Klonopin to patient for use as it is not prescribed to him and has adverse effects when taken with alcohol. Follow-up 3-4 weeks.

## 2014-10-02 NOTE — Assessment & Plan Note (Signed)
Will begin Lisinopril 10 mg daily.  DASH diet encouraged. Handout given. Patient also encouraged to begin weaning off of significant alcohol intake as this affects vasculature and therefore BP.  Follow-up in 3-4 weeks with PCP for reassessment.

## 2014-10-02 NOTE — Progress Notes (Signed)
Pre visit review using our clinic review tool, if applicable. No additional management support is needed unless otherwise documented below in the visit note/SLS  

## 2014-10-05 ENCOUNTER — Ambulatory Visit (INDEPENDENT_AMBULATORY_CARE_PROVIDER_SITE_OTHER): Payer: Medicare HMO | Admitting: Physician Assistant

## 2014-10-05 ENCOUNTER — Ambulatory Visit (INDEPENDENT_AMBULATORY_CARE_PROVIDER_SITE_OTHER)
Admission: RE | Admit: 2014-10-05 | Discharge: 2014-10-05 | Disposition: A | Discharge status: Home / Self Care | Payer: Medicare HMO | Source: Ambulatory Visit | Attending: Cardiology | Admitting: Cardiology

## 2014-10-05 DIAGNOSIS — Z8249 Family history of ischemic heart disease and other diseases of the circulatory system: Secondary | ICD-10-CM

## 2014-10-05 DIAGNOSIS — R072 Precordial pain: Secondary | ICD-10-CM

## 2014-10-05 MED ORDER — IOHEXOL 350 MG/ML SOLN
100.0000 mL | Freq: Once | INTRAVENOUS | Status: AC | PRN
  Administered 2014-10-05: 100 mL via INTRAVENOUS

## 2014-10-05 NOTE — Progress Notes (Signed)
Exercise Treadmill Test  Pre-Exercise Testing Evaluation Rhythm: normal sinus  Rate: 74 bpm     Test  Exercise Tolerance Test Ordering MD: Kirk Ruths, MD  Interpreting MD: Richardson Dopp, PA-C  Unique Test No: 1  Treadmill:  1  Indication for ETT: chest pain - rule out ischemia/family hx  Contraindication to ETT: No   Stress Modality: exercise - treadmill  Cardiac Imaging Performed: non   Protocol: standard Bruce - maximal  Max BP:  188/85  Max MPHR (bpm):  148 85% MPR (bpm):  126  MPHR obtained (bpm):  131 % MPHR obtained:  88  Reached 85% MPHR (min:sec):  4:25 Total Exercise Time (min-sec):  6:00  Workload in METS:  7.0 Borg Scale: 14  Reason ETT Terminated:  desired heart rate attained    ST Segment Analysis At Rest: normal ST segments - no evidence of significant ST depression With Exercise: non-specific ST changes  Other Information Arrhythmia:  No Angina during ETT:  absent (0) Quality of ETT:  diagnostic  ETT Interpretation:  normal - no evidence of ischemia by ST analysis  Comments: Fair exercise capacity. No chest pain. Normal BP response to exercise. Non-specific ST changes at peak exercise.  No changes to suggest ischemia.   Recommendations: FU with Dr. Kirk Ruths as planned. Signed,  Richardson Dopp, PA-C   10/05/2014 9:54 AM

## 2014-10-11 ENCOUNTER — Encounter: Payer: Self-pay | Admitting: Family Medicine

## 2014-10-11 ENCOUNTER — Ambulatory Visit (INDEPENDENT_AMBULATORY_CARE_PROVIDER_SITE_OTHER): Payer: Medicare HMO | Admitting: Family Medicine

## 2014-10-11 VITALS — BP 136/90 | HR 78 | Temp 97.9°F | Resp 16 | Wt 229.1 lb

## 2014-10-11 DIAGNOSIS — F329 Major depressive disorder, single episode, unspecified: Secondary | ICD-10-CM

## 2014-10-11 DIAGNOSIS — I10 Essential (primary) hypertension: Secondary | ICD-10-CM

## 2014-10-11 DIAGNOSIS — F32A Depression, unspecified: Secondary | ICD-10-CM

## 2014-10-11 DIAGNOSIS — F1010 Alcohol abuse, uncomplicated: Secondary | ICD-10-CM

## 2014-10-11 LAB — BASIC METABOLIC PANEL
BUN: 16 mg/dL (ref 6–23)
CALCIUM: 9.5 mg/dL (ref 8.4–10.5)
CO2: 25 mEq/L (ref 19–32)
CREATININE: 0.77 mg/dL (ref 0.40–1.50)
Chloride: 103 mEq/L (ref 96–112)
GFR: 105.35 mL/min (ref >60.00)
Glucose, Bld: 100 mg/dL — ABNORMAL HIGH (ref 70–99)
Potassium: 4.1 mEq/L (ref 3.5–5.1)
Sodium: 134 mEq/L — ABNORMAL LOW (ref 135–145)

## 2014-10-11 NOTE — Progress Notes (Signed)
   Subjective:    Patient ID: Devin [name], male    DOB: 1941/07/01, 74 y.o.   MRN: [id number]  HPI HTN- pt was started on Lisinopril at last visit due to elevated BP.  BP is better controlled today.  No CP, SOB, HAs, visual changes, edema.  Depression- pt's Prozac was increased to 40mg  daily at last visit.  'In 2008 I lost my job, in 2009 I lost my house, and then Devin [name] (son) died'  'i'm angry as well as down'.  Pt reports feeling better since increasing meds.  Pt is heading to [place] next week for 3 weeks.  Alcohol abuse- pt disclosed at last visit that he was drinking heavily and nightly.  Pt has cut back considerably since last visit.   Review of Systems For ROS see HPI     Objective:   Physical Exam  Constitutional: He is oriented to person, place, and time. He appears well-developed and well-nourished. No distress.  HENT:  Head: Normocephalic and atraumatic.  Eyes: Conjunctivae and EOM are normal. Pupils are equal, round, and reactive to light.  Neck: Normal range of motion. Neck supple. No thyromegaly present.  Cardiovascular: Normal rate, regular rhythm, normal heart sounds and intact distal pulses.   No murmur heard. Pulmonary/Chest: Effort normal and breath sounds normal. No respiratory distress.  Abdominal: Soft. Bowel sounds are normal. He exhibits no distension.  Musculoskeletal: He exhibits no edema.  Lymphadenopathy:    He has no cervical adenopathy.  Neurological: He is alert and oriented to person, place, and time. No cranial nerve deficit.  Skin: Skin is warm and dry.  Psychiatric: He has a normal mood and affect. His behavior is normal.  Vitals reviewed.         Assessment & Plan:

## 2014-10-11 NOTE — Assessment & Plan Note (Signed)
New to provider but pt disclosed his heavy drinking at last visit w/ PA.  Pt reports he has cut back dramatically and is feeling better about this.  Applauded his efforts.  Will continue to follow.

## 2014-10-11 NOTE — Progress Notes (Signed)
Pre visit review using our clinic review tool, if applicable. No additional management support is needed unless otherwise documented below in the visit note. 

## 2014-10-11 NOTE — Assessment & Plan Note (Signed)
Improved since increasing Prozac to 40mg  and decreasing his ETOH intake.  Pt has vacation upcoming where he will spend 3 weeks w/ wife and friends in [place].  This will be good for him and he is excited about.  Will continue to follow.

## 2014-10-11 NOTE — Patient Instructions (Signed)
Follow up in 3 months to recheck BP and mood Continue the Lisinopril daily Continue the Prozac at 40mg  daily I'm proud of you for cutting back on alcohol! We'll notify you of your lab results and make any changes if needed Call with any questions or concerns Happy New Year!!

## 2014-10-11 NOTE — Assessment & Plan Note (Signed)
dx'd at last visit and started on Lisinopril.  BP has improved.  Pt is asymptomatic.  Check BMP to ensure normal Cr and K+ since starting ACE.

## 2014-10-16 ENCOUNTER — Ambulatory Visit: Payer: Commercial Managed Care - HMO | Admitting: Family Medicine

## 2014-11-10 ENCOUNTER — Encounter: Payer: Self-pay | Admitting: Internal Medicine

## 2014-11-10 ENCOUNTER — Ambulatory Visit (INDEPENDENT_AMBULATORY_CARE_PROVIDER_SITE_OTHER): Payer: Medicare HMO | Admitting: Internal Medicine

## 2014-11-10 VITALS — BP 124/88 | HR 84 | Temp 97.7°F | Ht 74.0 in | Wt 230.1 lb

## 2014-11-10 DIAGNOSIS — J310 Chronic rhinitis: Secondary | ICD-10-CM

## 2014-11-10 DIAGNOSIS — J209 Acute bronchitis, unspecified: Secondary | ICD-10-CM

## 2014-11-10 MED ORDER — AMOXICILLIN 500 MG PO CAPS: 500.0000 mg | ORAL_CAPSULE | Freq: Three times a day (TID) | ORAL | Status: DC

## 2014-11-10 MED ORDER — HYDROCODONE-HOMATROPINE 5-1.5 MG/5ML PO SYRP: 5.0000 mL | ORAL_SOLUTION | Freq: Four times a day (QID) | ORAL | Status: DC | PRN

## 2014-11-10 NOTE — Progress Notes (Signed)
Pre visit review using our clinic review tool, if applicable. No additional management support is needed unless otherwise documented below in the visit note. 

## 2014-11-10 NOTE — Patient Instructions (Signed)

## 2014-11-10 NOTE — Progress Notes (Signed)
   Subjective:    Patient ID: Devin [name], male    DOB: March 10, 1941, 73 y.o.   MRN: [id number]  HPI His symptoms began  2 days ago as paroxysmal cough lasting four-5 minutes productive of yellow/gray sputum. He describes it as the consistency of Jell-O. The cough is worse with activity.  He describes itchy watery eyes. He also has associated shortness of breath. His head is congested but discharge has been clear. He has developed some sore throat today.  He has no history of asthma. He quit smoking 1994  Review of Systems He denies frontal headache, facial pain, nasal purulence, or otic pain. 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  The cough is not associated with wheezing.  He denies fever, chills, or sweats.  Intrinsic symptoms do not include sneezing    Objective:   Physical Exam  Pertinent or positive findings include: The left tympanic membrane is scarred and distorted. The nasal septum is deviated to the right;it is markedly  Erythematous.  General appearance:Adequately nourished; no acute distress or increased work of breathing is present.  No  lymphadenopathy about the head, neck, or axilla noted.   Eyes: No conjunctival inflammation or lid edema is present. There is no scleral icterus.  Ears:  External ear exam shows no significant lesions or deformities.    Nose:  External nasal examination shows no deformity or inflammation.    Oral exam: Dental hygiene is good; lips and gums are healthy appearing.There is no oropharyngeal erythema or exudate noted.   Neck:  No deformities, thyromegaly, masses, or tenderness noted.   Supple with full range of motion without pain.   Heart:  Normal rate and regular rhythm. S1 and S2 normal without gallop, murmur, click, rub or other extra sounds.   Lungs:Chest clear to auscultation; no wheezes, rhonchi,rales ,or rubs present.  Extremities:  No cyanosis, edema, or clubbing  noted    Skin: Warm  & dry w/o jaundice or tenting.       Assessment & Plan:  #1 acute bronchitis w/o bronchospasm #2 rhinitis Plan: See orders and recommendations

## 2014-11-27 ENCOUNTER — Ambulatory Visit (INDEPENDENT_AMBULATORY_CARE_PROVIDER_SITE_OTHER): Payer: Medicare HMO | Admitting: Family Medicine

## 2014-11-27 ENCOUNTER — Encounter: Payer: Self-pay | Admitting: Family Medicine

## 2014-11-27 VITALS — BP 122/84 | HR 92 | Temp 98.1°F | Resp 17 | Ht 74.0 in | Wt 230.1 lb

## 2014-11-27 DIAGNOSIS — F32A Depression, unspecified: Secondary | ICD-10-CM

## 2014-11-27 DIAGNOSIS — F329 Major depressive disorder, single episode, unspecified: Secondary | ICD-10-CM

## 2014-11-27 DIAGNOSIS — I10 Essential (primary) hypertension: Secondary | ICD-10-CM | POA: Diagnosis not present

## 2014-11-27 DIAGNOSIS — J209 Acute bronchitis, unspecified: Secondary | ICD-10-CM | POA: Diagnosis not present

## 2014-11-27 DIAGNOSIS — Z0000 Encounter for general adult medical examination without abnormal findings: Secondary | ICD-10-CM | POA: Diagnosis not present

## 2014-11-27 LAB — HEPATIC FUNCTION PANEL
ALBUMIN: 4.3 g/dL (ref 3.5–5.2)
ALK PHOS: 70 U/L (ref 39–117)
ALT: 28 U/L (ref 0–53)
AST: 23 U/L (ref 0–37)
Bilirubin, Direct: 0 mg/dL (ref 0.0–0.3)
Total Bilirubin: 0.4 mg/dL (ref 0.2–1.2)
Total Protein: 7.2 g/dL (ref 6.0–8.3)

## 2014-11-27 LAB — LIPID PANEL
CHOL/HDL RATIO: 3
Cholesterol: 197 mg/dL (ref 0–200)
HDL: 59.6 mg/dL (ref >39.00)
LDL CALC: 113 mg/dL — AB (ref 0–99)
NONHDL: 137.4
Triglycerides: 124 mg/dL (ref 0.0–149.0)
VLDL: 24.8 mg/dL (ref 0.0–40.0)

## 2014-11-27 LAB — BASIC METABOLIC PANEL
BUN: 21 mg/dL (ref 6–23)
CALCIUM: 9.6 mg/dL (ref 8.4–10.5)
CO2: 26 meq/L (ref 19–32)
CREATININE: 0.76 mg/dL (ref 0.40–1.50)
Chloride: 103 mEq/L (ref 96–112)
GFR: 106.92 mL/min (ref >60.00)
Glucose, Bld: 99 mg/dL (ref 70–99)
Potassium: 4.1 mEq/L (ref 3.5–5.1)
Sodium: 137 mEq/L (ref 135–145)

## 2014-11-27 LAB — CBC WITH DIFFERENTIAL/PLATELET
Basophils Absolute: 0 10*3/uL (ref 0.0–0.1)
Basophils Relative: 0.5 % (ref 0.0–3.0)
EOS PCT: 3.8 % (ref 0.0–5.0)
Eosinophils Absolute: 0.2 10*3/uL (ref 0.0–0.7)
HCT: 41.9 % (ref 39.0–52.0)
HEMOGLOBIN: 14.5 g/dL (ref 13.0–17.0)
Lymphocytes Relative: 34.7 % (ref 12.0–46.0)
Lymphs Abs: 2.2 10*3/uL (ref 0.7–4.0)
MCHC: 34.5 g/dL (ref 30.0–36.0)
MCV: 90.2 fl (ref 78.0–100.0)
Monocytes Absolute: 0.6 10*3/uL (ref 0.1–1.0)
Monocytes Relative: 9 % (ref 3.0–12.0)
NEUTROS PCT: 52 % (ref 43.0–77.0)
Neutro Abs: 3.4 10*3/uL (ref 1.4–7.7)
Platelets: 165 10*3/uL (ref 150.0–400.0)
RBC: 4.65 Mil/uL (ref 4.22–5.81)
RDW: 12.9 % (ref 11.5–15.5)
WBC: 6.4 10*3/uL (ref 4.0–10.5)

## 2014-11-27 LAB — TSH: TSH: 1.67 u[IU]/mL (ref 0.35–4.50)

## 2014-11-27 MED ORDER — ALBUTEROL SULFATE HFA 108 (90 BASE) MCG/ACT IN AERS: 2.0000 | INHALATION_SPRAY | Freq: Four times a day (QID) | RESPIRATORY_TRACT | Status: DC | PRN

## 2014-11-27 MED ORDER — ALBUTEROL SULFATE (2.5 MG/3ML) 0.083% IN NEBU
2.5000 mg | INHALATION_SOLUTION | Freq: Once | RESPIRATORY_TRACT | Status: AC
Start: 2014-11-27 — End: 2014-11-27
  Administered 2014-11-27: 2.5 mg via RESPIRATORY_TRACT

## 2014-11-27 MED ORDER — GUAIFENESIN-CODEINE 100-10 MG/5ML PO SYRP: 10.0000 mL | ORAL_SOLUTION | Freq: Three times a day (TID) | ORAL | Status: DC | PRN

## 2014-11-27 NOTE — Progress Notes (Signed)
   Subjective:    Patient ID: Devin [name], male    DOB: Feb 21, 1941, 74 y.o.   MRN: [id number]  HPI Here today for CPE.  Risk Factors: HTN- chronic problem, well controlled on Lisinopril.  Denies CP, SOB, HAs, visual changes, edema. Bronchitis- pt seen by Hopp on 2/12.  Was treated w/ Amox and Hydrocodone cough syrup.  Pt reports hx of similar.  No recent fevers.  Continues to have wet, productive cough.  Unable to lie down due to cough- 'i have to sleep sitting up'.  Denies current sinus pain/pressure.  No HAs.  L ear ache.  No N/V  Physical Activity: exercising regularly w/ exception of recently due to URI Fall Risk: low Depression: chronic problem, on Prozac.  Pt was self medicating w/ ETOH earlier this year- stopped. Hearing: normal to conversational tones, whispered voice at 6 ft ADL's: independent Cognitive: normal linear thought process, memory and attention intact Home Safety: safe at home Height, Weight, BMI, Visual Acuity: see vitals, vision corrected to 20/20 w/ glasses Counseling: UTD on colonoscopy, seeing urology (Puchinsky)  UTD all vaccines. Labs Ordered: See A&P Care Plan: See A&P    Review of Systems Patient reports no vision/hearing changes, anorexia, fever ,adenopathy, persistant/recurrent hoarseness, swallowing issues, chest pain, palpitations, edema, persistant/recurrent cough, hemoptysis, dyspnea (rest,exertional, paroxysmal nocturnal), gastrointestinal  bleeding (melena, rectal bleeding), abdominal pain, excessive heart burn, GU symptoms (dysuria, hematuria, voiding/incontinence issues) syncope, focal weakness, memory loss, numbness & tingling, skin/hair/nail changes, depression, anxiety, abnormal bruising/bleeding, musculoskeletal symptoms/signs.   Reviewed meds, allergies, problem list, and PMH in chart     Objective:   Physical Exam General Appearance:    Alert, cooperative, no distress, appears stated age  Head:    Normocephalic, without obvious abnormality,  atraumatic  Eyes:    PERRL, conjunctiva/corneas clear, EOM's intact, fundi    benign, both eyes       Ears:    Normal TM's and external ear canals, both ears  Nose:   Nares normal, septum midline, mucosa normal, no drainage   or sinus tenderness  Throat:   Lips, mucosa, and tongue normal; teeth and gums normal  Neck:   Supple, symmetrical, trachea midline, no adenopathy;       thyroid:  No enlargement/tenderness/nodules  Back:     Symmetric, no curvature, ROM normal, no CVA tenderness  Lungs:     Clear to auscultation bilaterally, respirations unlabored  Chest wall:    No tenderness or deformity  Heart:    Regular rate and rhythm, S1 and S2 normal, no murmur, rub   or gallop  Abdomen:     Soft, non-tender, bowel sounds active all four quadrants,    no masses, no organomegaly  Genitalia:    Deferred to urology  Rectal:    Extremities:   Extremities normal, atraumatic, no cyanosis or edema  Pulses:   2+ and symmetric all extremities  Skin:   Skin color, texture, turgor normal, no rashes or lesions  Lymph nodes:   Cervical, supraclavicular, and axillary nodes normal  Neurologic:   CNII-XII intact. Normal strength, sensation and reflexes      throughout          Assessment & Plan:

## 2014-11-27 NOTE — Patient Instructions (Signed)
Follow up in 6 months to recheck BP We'll notify you of your lab results and make any changes if needed Continue the Mucinex DM for daytime cough- use the prescription syrup for nights/weekends (will cause drowsiness) Use the inhaler-2 puffs every 4-6 hrs as needed for cough, shortness of breath, wheezing Drink plenty of fluids REST! Once you are feeling better, resume exercise Call with any questions or concerns Hang in there!!!

## 2014-11-27 NOTE — Progress Notes (Signed)
Pre visit review using our clinic review tool, if applicable. No additional management support is needed unless otherwise documented below in the visit note. 

## 2014-11-29 DIAGNOSIS — M1711 Unilateral primary osteoarthritis, right knee: Secondary | ICD-10-CM | POA: Diagnosis not present

## 2014-11-30 ENCOUNTER — Telehealth: Payer: Self-pay | Admitting: *Deleted

## 2014-11-30 NOTE — Telephone Encounter (Signed)
Received medical record request via fax from [facility] for lab reports from 11/27/2014. Requested records faxed. [id number]//[id number]

## 2014-12-13 NOTE — Assessment & Plan Note (Signed)
Improving since pt stopped self medicating w/ alcohol.  Continue prozac.  No changes at this time.  Will follow.

## 2014-12-13 NOTE — Assessment & Plan Note (Signed)
Pt's PE WNL.  UTD on colonoscopy, urology (Dr Burnell Blanks), all vaccines.  Written screening schedule updated and given to pt.  Check labs.  Anticipatory guidance provided.

## 2014-12-13 NOTE — Assessment & Plan Note (Signed)
New to provider, ongoing for pt.  tx'd w/ abx.  Cough and air movement improved s/p neb tx in office.  Prescription for inhaler given.  Start cough meds prn.  Reviewed supportive care and red flags that should prompt return.  Pt expressed understanding and is in agreement w/ plan.

## 2014-12-13 NOTE — Assessment & Plan Note (Signed)
Chronic problem.  Well controlled.  Asymptomatic.  Check labs to assess BMP and risk stratify.  No anticipated med changes.

## 2014-12-19 ENCOUNTER — Other Ambulatory Visit: Payer: Self-pay | Admitting: Family Medicine

## 2014-12-19 MED ORDER — ASPIRIN 81 MG PO TABS: 81.0000 mg | ORAL_TABLET | Freq: Every day | ORAL | Status: DC

## 2014-12-19 MED ORDER — MULTI-VITAMIN/MINERALS PO TABS: 1.0000 | ORAL_TABLET | Freq: Every day | ORAL | Status: DC

## 2014-12-19 MED ORDER — CALCIUM CARBONATE 600 MG PO TABS: 600.0000 mg | ORAL_TABLET | Freq: Every day | ORAL | Status: DC

## 2014-12-19 MED ORDER — FISH OIL 1000 MG PO CAPS: 1000.0000 mg | ORAL_CAPSULE | Freq: Every day | ORAL | Status: DC

## 2014-12-19 NOTE — Telephone Encounter (Signed)
Caller name:[name], Devin Relation to NO:IBBC Call back number:[phone] Pharmacy:[facility]  Reason for call: pt states [facility] informed him to contact his pcp and request that they fax over a copy of his current med list so that all of his prescriptions can be sent from the [place] to his home.

## 2014-12-19 NOTE — Telephone Encounter (Signed)
Spoke with patient to verify medication list, updated and faxed to [facility]

## 2015-01-08 ENCOUNTER — Ambulatory Visit: Payer: Commercial Managed Care - HMO | Admitting: Family Medicine

## 2015-01-22 DIAGNOSIS — H35051 Retinal neovascularization, unspecified, right eye: Secondary | ICD-10-CM | POA: Diagnosis not present

## 2015-01-22 DIAGNOSIS — H3532 Exudative age-related macular degeneration: Secondary | ICD-10-CM | POA: Diagnosis not present

## 2015-01-25 ENCOUNTER — Telehealth: Payer: Self-pay | Admitting: Family Medicine

## 2015-01-25 DIAGNOSIS — Z125 Encounter for screening for malignant neoplasm of prostate: Secondary | ICD-10-CM

## 2015-01-25 NOTE — Telephone Encounter (Signed)
Referral placed.

## 2015-01-25 NOTE — Telephone Encounter (Signed)
Caller name: [place] Relation to pt: self Call back number:  [phone] Pharmacy:  Reason for call:   Needs referral to proctologist. Has an appt on 02/06/15. This is for his yearly.  Dr. Orland Mustard  Has [employer]

## 2015-01-25 NOTE — Telephone Encounter (Signed)
auth # [id number], pt aware

## 2015-01-30 ENCOUNTER — Telehealth: Payer: Self-pay | Admitting: Family Medicine

## 2015-01-30 DIAGNOSIS — I10 Essential (primary) hypertension: Secondary | ICD-10-CM

## 2015-01-30 MED ORDER — LISINOPRIL 10 MG PO TABS: 10.0000 mg | ORAL_TABLET | Freq: Every day | ORAL | Status: DC

## 2015-01-30 NOTE — Telephone Encounter (Signed)
Caller name: [place] Relation to pt: self Call back number: [phone] Pharmacy: [place]  Reason for call:   Requesting lisinopril refill

## 2015-01-30 NOTE — Telephone Encounter (Signed)
Med filled.  

## 2015-02-06 DIAGNOSIS — N401 Enlarged prostate with lower urinary tract symptoms: Secondary | ICD-10-CM | POA: Diagnosis not present

## 2015-02-06 DIAGNOSIS — N138 Other obstructive and reflux uropathy: Secondary | ICD-10-CM | POA: Diagnosis not present

## 2015-02-06 DIAGNOSIS — R358 Other polyuria: Secondary | ICD-10-CM | POA: Diagnosis not present

## 2015-02-06 LAB — HEPATIC FUNCTION PANEL
ALT: 29 U/L (ref 10–40)
AST: 26 U/L (ref 14–40)
Alkaline Phosphatase: 67 U/L (ref 25–125)
Bilirubin, Total: 0.2 mg/dL

## 2015-02-06 LAB — BASIC METABOLIC PANEL
BUN: 13 mg/dL (ref 4–21)
Creatinine: 0.8 mg/dL (ref <=1.3)
GLUCOSE: 95 mg/dL
POTASSIUM: 4.4 mmol/L (ref 3.4–5.3)
SODIUM: 140 mmol/L (ref 137–147)

## 2015-02-06 LAB — PSA: PSA: 3.4

## 2015-02-08 ENCOUNTER — Other Ambulatory Visit: Payer: Self-pay | Admitting: Family Medicine

## 2015-02-08 NOTE — Telephone Encounter (Signed)
Med filled.  

## 2015-02-13 ENCOUNTER — Encounter: Payer: Self-pay | Admitting: Family Medicine

## 2015-02-13 ENCOUNTER — Encounter: Payer: Self-pay | Admitting: General Practice

## 2015-02-14 NOTE — Telephone Encounter (Signed)
Please review pt's MyChart message and resubmit his claim/charges for Feb 29th if possible.  Thanks!

## 2015-02-23 ENCOUNTER — Encounter: Payer: Self-pay | Admitting: Family Medicine

## 2015-02-23 ENCOUNTER — Telehealth: Payer: Self-pay | Admitting: Family Medicine

## 2015-02-23 ENCOUNTER — Ambulatory Visit (INDEPENDENT_AMBULATORY_CARE_PROVIDER_SITE_OTHER): Payer: Commercial Managed Care - HMO | Admitting: Family Medicine

## 2015-02-23 VITALS — BP 130/82 | HR 94 | Temp 98.0°F | Resp 16 | Wt 232.4 lb

## 2015-02-23 DIAGNOSIS — B369 Superficial mycosis, unspecified: Secondary | ICD-10-CM

## 2015-02-23 MED ORDER — CLOTRIMAZOLE-BETAMETHASONE 1-0.05 % EX CREA: 1.0000 "application " | TOPICAL_CREAM | Freq: Two times a day (BID) | CUTANEOUS | Status: DC

## 2015-02-23 MED ORDER — NYSTATIN-TRIAMCINOLONE 100000-0.1 UNIT/GM-% EX OINT: 1.0000 "application " | TOPICAL_OINTMENT | Freq: Two times a day (BID) | CUTANEOUS | Status: DC

## 2015-02-23 MED ORDER — FLUCONAZOLE 100 MG PO TABS: 100.0000 mg | ORAL_TABLET | Freq: Every day | ORAL | Status: DC

## 2015-02-23 NOTE — Assessment & Plan Note (Signed)
New.  Severe.  No relief w/ previous Clotrimazole/Hydrocortisone cream.  No evidence of superimposed infection.  Will start topical Mycolog twice daily but due to severity will also treat w/ oral antifungal medication.  Reviewed supportive care and red flags that should prompt return.  Pt expressed understanding and is in agreement w/ plan.

## 2015-02-23 NOTE — Telephone Encounter (Signed)
Ok to change

## 2015-02-23 NOTE — Progress Notes (Signed)
Pre visit review using our clinic review tool, if applicable. No additional management support is needed unless otherwise documented below in the visit note. 

## 2015-02-23 NOTE — Telephone Encounter (Signed)
Ok to change prescription to the Lotrisone cream

## 2015-02-23 NOTE — Progress Notes (Signed)
   Subjective:    Patient ID: Devin [name], male    DOB: 10-14-1940, 74 y.o.   MRN: [id number]  HPI Fungal dermatitis- 'this jock itch thing flared up'.  Using Clotrimazole/Hydrocortisone cream prescribed in [place] w/o relief.  Using OTC antifungal foot powder w/o relief.  Pt reports it is now painful to exercise.  Pt reports strong, foul odor.   Review of Systems For ROS see HPI     Objective:   Physical Exam  Constitutional: He appears well-developed and well-nourished. No distress.  Skin: Skin is warm and dry. There is erythema (beefy red, excoriated, w/ satellite lesions in the groin bilaterally).  Psychiatric: He has a normal mood and affect. His behavior is normal. Thought content normal.  Vitals reviewed.         Assessment & Plan:

## 2015-02-23 NOTE — Telephone Encounter (Signed)
Caller name:  [id number] [place], [address] [phone] (Phone) [phone] (Fax)       Reason for call:  As per pt nystatin-triamcinolone ointment (MYCOLOG) is to expensive insurance suggest clotrimazole betamethasone cream. Pharmacy requesting RX.

## 2015-02-23 NOTE — Telephone Encounter (Signed)
Med filled and chart updated.

## 2015-02-23 NOTE — Patient Instructions (Signed)
Follow up as needed Start the Mycolog cream twice daily on the affected areas Start the Fluconazole daily x7 days to improve the rash Try and keep area clean and dry Call with any questions or concerns Hang in there!!!

## 2015-03-02 DIAGNOSIS — N401 Enlarged prostate with lower urinary tract symptoms: Secondary | ICD-10-CM | POA: Diagnosis not present

## 2015-03-02 DIAGNOSIS — N138 Other obstructive and reflux uropathy: Secondary | ICD-10-CM | POA: Diagnosis not present

## 2015-03-14 ENCOUNTER — Encounter: Payer: Self-pay | Admitting: Family Medicine

## 2015-03-15 DIAGNOSIS — H524 Presbyopia: Secondary | ICD-10-CM | POA: Diagnosis not present

## 2015-03-15 DIAGNOSIS — H5210 Myopia, unspecified eye: Secondary | ICD-10-CM | POA: Diagnosis not present

## 2015-03-19 DIAGNOSIS — H3532 Exudative age-related macular degeneration: Secondary | ICD-10-CM | POA: Diagnosis not present

## 2015-04-23 ENCOUNTER — Telehealth: Payer: Self-pay | Admitting: Family Medicine

## 2015-04-23 DIAGNOSIS — N401 Enlarged prostate with lower urinary tract symptoms: Secondary | ICD-10-CM | POA: Diagnosis not present

## 2015-04-23 DIAGNOSIS — R339 Retention of urine, unspecified: Secondary | ICD-10-CM | POA: Diagnosis not present

## 2015-04-23 DIAGNOSIS — N138 Other obstructive and reflux uropathy: Secondary | ICD-10-CM | POA: Diagnosis not present

## 2015-04-23 NOTE — Telephone Encounter (Signed)
Caller name: Tyrick Dunagan Relationship to patient: self Can be reached: [phone]   Reason for call: Pt has appt with Dr. Harlow Asa, proctologist, on 05/04/15. He states he needs [place] for [employer] Please f/u as needed.

## 2015-04-25 ENCOUNTER — Other Ambulatory Visit: Payer: Self-pay | Admitting: Family Medicine

## 2015-05-04 DIAGNOSIS — N138 Other obstructive and reflux uropathy: Secondary | ICD-10-CM | POA: Diagnosis not present

## 2015-05-04 DIAGNOSIS — N401 Enlarged prostate with lower urinary tract symptoms: Secondary | ICD-10-CM | POA: Diagnosis not present

## 2015-05-07 NOTE — Telephone Encounter (Signed)
Pt had insurance auth placed on 01/25/15, referral is still good through 07/24/15. Auth # [id number]

## 2015-05-17 DIAGNOSIS — N138 Other obstructive and reflux uropathy: Secondary | ICD-10-CM | POA: Diagnosis not present

## 2015-05-17 DIAGNOSIS — N401 Enlarged prostate with lower urinary tract symptoms: Secondary | ICD-10-CM | POA: Diagnosis not present

## 2015-05-21 DIAGNOSIS — N401 Enlarged prostate with lower urinary tract symptoms: Secondary | ICD-10-CM | POA: Diagnosis not present

## 2015-05-23 DIAGNOSIS — N411 Chronic prostatitis: Secondary | ICD-10-CM | POA: Diagnosis not present

## 2015-05-23 DIAGNOSIS — N410 Acute prostatitis: Secondary | ICD-10-CM | POA: Diagnosis not present

## 2015-05-23 DIAGNOSIS — Z79899 Other long term (current) drug therapy: Secondary | ICD-10-CM | POA: Diagnosis not present

## 2015-05-23 DIAGNOSIS — N134 Hydroureter: Secondary | ICD-10-CM | POA: Diagnosis not present

## 2015-05-23 DIAGNOSIS — N401 Enlarged prostate with lower urinary tract symptoms: Secondary | ICD-10-CM | POA: Diagnosis not present

## 2015-05-23 DIAGNOSIS — N1330 Unspecified hydronephrosis: Secondary | ICD-10-CM | POA: Diagnosis not present

## 2015-05-23 DIAGNOSIS — N138 Other obstructive and reflux uropathy: Secondary | ICD-10-CM | POA: Diagnosis not present

## 2015-05-23 DIAGNOSIS — K219 Gastro-esophageal reflux disease without esophagitis: Secondary | ICD-10-CM | POA: Diagnosis not present

## 2015-05-23 DIAGNOSIS — N400 Enlarged prostate without lower urinary tract symptoms: Secondary | ICD-10-CM | POA: Diagnosis not present

## 2015-05-24 DIAGNOSIS — N410 Acute prostatitis: Secondary | ICD-10-CM | POA: Diagnosis not present

## 2015-05-24 DIAGNOSIS — N1330 Unspecified hydronephrosis: Secondary | ICD-10-CM | POA: Diagnosis not present

## 2015-05-24 DIAGNOSIS — N138 Other obstructive and reflux uropathy: Secondary | ICD-10-CM | POA: Diagnosis not present

## 2015-05-24 DIAGNOSIS — N411 Chronic prostatitis: Secondary | ICD-10-CM | POA: Diagnosis not present

## 2015-05-24 DIAGNOSIS — Z79899 Other long term (current) drug therapy: Secondary | ICD-10-CM | POA: Diagnosis not present

## 2015-05-24 DIAGNOSIS — K219 Gastro-esophageal reflux disease without esophagitis: Secondary | ICD-10-CM | POA: Diagnosis not present

## 2015-05-24 DIAGNOSIS — N401 Enlarged prostate with lower urinary tract symptoms: Secondary | ICD-10-CM | POA: Diagnosis not present

## 2015-05-29 DIAGNOSIS — N138 Other obstructive and reflux uropathy: Secondary | ICD-10-CM | POA: Diagnosis not present

## 2015-05-29 DIAGNOSIS — N401 Enlarged prostate with lower urinary tract symptoms: Secondary | ICD-10-CM | POA: Diagnosis not present

## 2015-05-30 ENCOUNTER — Ambulatory Visit (INDEPENDENT_AMBULATORY_CARE_PROVIDER_SITE_OTHER): Payer: Commercial Managed Care - HMO | Admitting: Family Medicine

## 2015-05-30 ENCOUNTER — Encounter: Payer: Self-pay | Admitting: Family Medicine

## 2015-05-30 ENCOUNTER — Telehealth: Payer: Self-pay | Admitting: Family Medicine

## 2015-05-30 VITALS — BP 126/82 | HR 93 | Temp 98.1°F | Resp 16 | Ht 74.0 in | Wt 230.0 lb

## 2015-05-30 DIAGNOSIS — I10 Essential (primary) hypertension: Secondary | ICD-10-CM | POA: Diagnosis not present

## 2015-05-30 DIAGNOSIS — N401 Enlarged prostate with lower urinary tract symptoms: Secondary | ICD-10-CM

## 2015-05-30 DIAGNOSIS — M25561 Pain in right knee: Secondary | ICD-10-CM

## 2015-05-30 DIAGNOSIS — M25562 Pain in left knee: Secondary | ICD-10-CM | POA: Diagnosis not present

## 2015-05-30 DIAGNOSIS — Z23 Encounter for immunization: Secondary | ICD-10-CM

## 2015-05-30 DIAGNOSIS — N138 Other obstructive and reflux uropathy: Secondary | ICD-10-CM | POA: Insufficient documentation

## 2015-05-30 NOTE — Assessment & Plan Note (Signed)
Improved since starting Mobic.  Pt is very happy w/ results.

## 2015-05-30 NOTE — Progress Notes (Signed)
   Subjective:    Patient ID: Azaan Leask, male    DOB: [birth year], 74 y.o.   MRN: [id number]  HPI HTN- chronic problem, on Lisinopril daily.  Well controlled.  No CP, SOB, HAs, visual changes, edema.  Fewer HAs than previous.  Arthritis- pt reports sxs are much improved on Mobic.  'i've got 80% of my mobility back'.   Review of Systems For ROS see HPI     Objective:   Physical Exam  Constitutional: He is oriented to person, place, and time. He appears well-developed and well-nourished. No distress.  HENT:  Head: Normocephalic and atraumatic.  Eyes: Conjunctivae and EOM are normal. Pupils are equal, round, and reactive to light.  Neck: Normal range of motion. Neck supple. No thyromegaly present.  Cardiovascular: Normal rate, regular rhythm, normal heart sounds and intact distal pulses.   No murmur heard. Pulmonary/Chest: Effort normal and breath sounds normal. No respiratory distress.  Abdominal: Soft. Bowel sounds are normal. He exhibits no distension.  Musculoskeletal: He exhibits no edema.  Lymphadenopathy:    He has no cervical adenopathy.  Neurological: He is alert and oriented to person, place, and time. No cranial nerve deficit.  Skin: Skin is warm and dry.  Psychiatric: He has a normal mood and affect. His behavior is normal.  Vitals reviewed.         Assessment & Plan:

## 2015-05-30 NOTE — Progress Notes (Signed)
Pre visit review using our clinic review tool, if applicable. No additional management support is needed unless otherwise documented below in the visit note. 

## 2015-05-30 NOTE — Patient Instructions (Signed)
Schedule your complete physical in 6 months We'll notify you of your lab results and make any changes if needed Keep up the good work!  You look great! Call with any questions or concerns Have an AMAZING trip!!!

## 2015-05-30 NOTE — Assessment & Plan Note (Signed)
Chronic problem.  Well controlled.  Asymptomatic.  Check labs.  No anticipated med changes.  Will follow. 

## 2015-05-30 NOTE — Telephone Encounter (Signed)
Pt would like to transfer  from [place] to you.

## 2015-05-30 NOTE — Telephone Encounter (Signed)
Dr. Larose Kells is not accepting new Medicare or replacement Medicare Pts. Thank you.

## 2015-05-31 HISTORY — PX: PROSTATE SURGERY: SHX751

## 2015-06-15 ENCOUNTER — Ambulatory Visit: Payer: Commercial Managed Care - HMO | Admitting: Family Medicine

## 2015-06-19 ENCOUNTER — Ambulatory Visit (INDEPENDENT_AMBULATORY_CARE_PROVIDER_SITE_OTHER): Payer: Commercial Managed Care - HMO | Admitting: Family Medicine

## 2015-06-19 ENCOUNTER — Encounter: Payer: Self-pay | Admitting: Family Medicine

## 2015-06-19 VITALS — BP 126/80 | HR 76 | Temp 98.0°F | Resp 17 | Ht 74.0 in | Wt 229.2 lb

## 2015-06-19 DIAGNOSIS — J309 Allergic rhinitis, unspecified: Secondary | ICD-10-CM | POA: Insufficient documentation

## 2015-06-19 DIAGNOSIS — J301 Allergic rhinitis due to pollen: Secondary | ICD-10-CM | POA: Diagnosis not present

## 2015-06-19 MED ORDER — FLUTICASONE PROPIONATE 50 MCG/ACT NA SUSP: 2.0000 | Freq: Every day | NASAL | Status: DC

## 2015-06-19 MED ORDER — CLOTRIMAZOLE-BETAMETHASONE 1-0.05 % EX CREA: 1.0000 "application " | TOPICAL_CREAM | Freq: Two times a day (BID) | CUTANEOUS | Status: DC

## 2015-06-19 NOTE — Progress Notes (Signed)
   Subjective:    Patient ID: Devin [name], male    DOB: 1941-04-06, 74 y.o.   MRN: [id number]  HPI Cough- 'i've got this dry cough at night.  i can't shake it'.  Improves w/ elevation of head.  + PND.  Not currently on allergy medication, denies previous allergies but notes an increase in congestion since relocating to this area.  No fevers/chills.  No known sick contacts.     Review of Systems For ROS see HPI     Objective:   Physical Exam  Constitutional: He appears well-developed and well-nourished. No distress.  HENT:  Head: Normocephalic and atraumatic.  No TTP over sinuses + turbinate edema + PND TMs normal bilaterally  Eyes: Conjunctivae and EOM are normal. Pupils are equal, round, and reactive to light.  Neck: Normal range of motion. Neck supple.  Cardiovascular: Normal rate, regular rhythm and normal heart sounds.   Pulmonary/Chest: Effort normal and breath sounds normal. No respiratory distress. He has no wheezes.  Lymphadenopathy:    He has no cervical adenopathy.  Skin: Skin is warm and dry.  Vitals reviewed.         Assessment & Plan:

## 2015-06-19 NOTE — Progress Notes (Signed)
Pre visit review using our clinic review tool, if applicable. No additional management support is needed unless otherwise documented below in the visit note. 

## 2015-06-19 NOTE — Assessment & Plan Note (Signed)
New.  Pt's sxs and nocturnal cough consistent w/ untreated allergic rhinitis and PND.  Start daily antihistamine and nasal steroid.  Reviewed supportive care and red flags that should prompt return.  Pt expressed understanding and is in agreement w/ plan.

## 2015-06-19 NOTE — Patient Instructions (Signed)
Follow up as scheduled Start daily Claritin or Zyrtec to decrease your post-nasal drip which is causing your cough Drink plenty of fluids Start the Flonase-2 sprays each nostril daily Use Mucinex DM- available over the counter- to decrease cough/congestion Call with any questions or concerns Have a great trip!!!

## 2015-06-20 ENCOUNTER — Telehealth: Payer: Self-pay | Admitting: Family Medicine

## 2015-06-20 MED ORDER — CLOTRIMAZOLE-BETAMETHASONE 1-0.05 % EX CREA: 1.0000 "application " | TOPICAL_CREAM | Freq: Two times a day (BID) | CUTANEOUS | Status: DC

## 2015-06-20 MED ORDER — FLUTICASONE PROPIONATE 50 MCG/ACT NA SUSP: 2.0000 | Freq: Every day | NASAL | Status: DC

## 2015-06-20 NOTE — Telephone Encounter (Signed)
Medication filled to pharmacy as requested.   

## 2015-06-20 NOTE — Telephone Encounter (Signed)
Pt asked that we send refills for 3 month supply of Lotrisone and flonase to [facility] as soon as possible. He picked up the flonase at the pharmacy but took back the cream bc it was expensive there.

## 2015-06-21 DIAGNOSIS — H3532 Exudative age-related macular degeneration: Secondary | ICD-10-CM | POA: Diagnosis not present

## 2015-07-06 NOTE — Telephone Encounter (Signed)
LVM informing pt. Request a call back if need be.

## 2015-08-09 ENCOUNTER — Other Ambulatory Visit: Payer: Self-pay | Admitting: Family Medicine

## 2015-08-09 NOTE — Telephone Encounter (Signed)
Medication filled to pharmacy as requested.   

## 2015-08-14 DIAGNOSIS — H353211 Exudative age-related macular degeneration, right eye, with active choroidal neovascularization: Secondary | ICD-10-CM | POA: Diagnosis not present

## 2015-09-12 ENCOUNTER — Telehealth: Payer: Self-pay | Admitting: Family Medicine

## 2015-09-12 DIAGNOSIS — D229 Melanocytic nevi, unspecified: Secondary | ICD-10-CM

## 2015-09-12 NOTE — Telephone Encounter (Signed)
Pt would like referral to dermatologist in [facility]. He said that he has a mole on his neck that he would like removed because it is difficult to shave. He has been to [facility] in the past.

## 2015-09-12 NOTE — Telephone Encounter (Signed)
Referral placed.

## 2015-10-09 DIAGNOSIS — H353211 Exudative age-related macular degeneration, right eye, with active choroidal neovascularization: Secondary | ICD-10-CM | POA: Diagnosis not present

## 2015-10-11 DIAGNOSIS — N401 Enlarged prostate with lower urinary tract symptoms: Secondary | ICD-10-CM | POA: Diagnosis not present

## 2015-10-11 DIAGNOSIS — L821 Other seborrheic keratosis: Secondary | ICD-10-CM | POA: Diagnosis not present

## 2015-10-11 DIAGNOSIS — N138 Other obstructive and reflux uropathy: Secondary | ICD-10-CM | POA: Diagnosis not present

## 2015-10-11 DIAGNOSIS — L570 Actinic keratosis: Secondary | ICD-10-CM | POA: Diagnosis not present

## 2015-11-26 ENCOUNTER — Telehealth: Payer: Self-pay | Admitting: *Deleted

## 2015-11-26 ENCOUNTER — Encounter: Payer: Self-pay | Admitting: *Deleted

## 2015-11-26 NOTE — Telephone Encounter (Signed)
Pre-Visit Call completed with patient and chart updated.   Pre-Visit Info documented in Specialty Comments under [employer].    

## 2015-11-27 ENCOUNTER — Encounter: Payer: Self-pay | Admitting: Internal Medicine

## 2015-11-27 ENCOUNTER — Ambulatory Visit (INDEPENDENT_AMBULATORY_CARE_PROVIDER_SITE_OTHER): Payer: Commercial Managed Care - HMO | Admitting: Internal Medicine

## 2015-11-27 VITALS — BP 124/68 | HR 67 | Temp 98.2°F | Ht 74.0 in | Wt 229.2 lb

## 2015-11-27 DIAGNOSIS — I10 Essential (primary) hypertension: Secondary | ICD-10-CM

## 2015-11-27 DIAGNOSIS — F329 Major depressive disorder, single episode, unspecified: Secondary | ICD-10-CM | POA: Diagnosis not present

## 2015-11-27 DIAGNOSIS — M159 Polyosteoarthritis, unspecified: Secondary | ICD-10-CM

## 2015-11-27 DIAGNOSIS — M150 Primary generalized (osteo)arthritis: Secondary | ICD-10-CM

## 2015-11-27 DIAGNOSIS — Z09 Encounter for follow-up examination after completed treatment for conditions other than malignant neoplasm: Secondary | ICD-10-CM

## 2015-11-27 DIAGNOSIS — N138 Other obstructive and reflux uropathy: Secondary | ICD-10-CM

## 2015-11-27 DIAGNOSIS — N401 Enlarged prostate with lower urinary tract symptoms: Secondary | ICD-10-CM

## 2015-11-27 DIAGNOSIS — F32A Depression, unspecified: Secondary | ICD-10-CM

## 2015-11-27 LAB — COMPLETE METABOLIC PANEL WITH GFR
ALT: 20 U/L (ref 9–46)
AST: 21 U/L (ref 10–35)
Albumin: 4.1 g/dL (ref 3.6–5.1)
Alkaline Phosphatase: 70 U/L (ref 40–115)
BUN: 19 mg/dL (ref 7–25)
CALCIUM: 8.9 mg/dL (ref 8.6–10.3)
CHLORIDE: 106 mmol/L (ref 98–110)
CO2: 24 mmol/L (ref 20–31)
CREATININE: 0.79 mg/dL (ref 0.70–1.18)
GFR, Est African American: >89 mL/min (ref >=60)
GFR, Est Non African American: 89 mL/min (ref >=60)
Glucose, Bld: 95 mg/dL (ref 65–99)
POTASSIUM: 4.3 mmol/L (ref 3.5–5.3)
Sodium: 139 mmol/L (ref 135–146)
Total Bilirubin: 0.3 mg/dL (ref 0.2–1.2)
Total Protein: 6.5 g/dL (ref 6.1–8.1)

## 2015-11-27 LAB — CBC WITH DIFFERENTIAL/PLATELET
BASOS PCT: 0.4 % (ref 0.0–3.0)
Basophils Absolute: 0 10*3/uL (ref 0.0–0.1)
EOS ABS: 0.1 10*3/uL (ref 0.0–0.7)
EOS PCT: 1.7 % (ref 0.0–5.0)
HEMATOCRIT: 39.6 % (ref 39.0–52.0)
HEMOGLOBIN: 13.2 g/dL (ref 13.0–17.0)
LYMPHS PCT: 27.2 % (ref 12.0–46.0)
Lymphs Abs: 1.7 10*3/uL (ref 0.7–4.0)
MCHC: 33.4 g/dL (ref 30.0–36.0)
MCV: 91.5 fl (ref 78.0–100.0)
Monocytes Absolute: 0.6 10*3/uL (ref 0.1–1.0)
Monocytes Relative: 9.4 % (ref 3.0–12.0)
NEUTROS ABS: 3.8 10*3/uL (ref 1.4–7.7)
Neutrophils Relative %: 61.3 % (ref 43.0–77.0)
PLATELETS: 152 10*3/uL (ref 150.0–400.0)
RBC: 4.33 Mil/uL (ref 4.22–5.81)
RDW: 14.4 % (ref 11.5–15.5)
WBC: 6.3 10*3/uL (ref 4.0–10.5)

## 2015-11-27 NOTE — Patient Instructions (Signed)
GO TO THE LAB : Get the blood work    GO TO THE FRONT DESK Schedule a routine office visit or check up to be done in  4 to 6 months  Please be fasting

## 2015-11-27 NOTE — Progress Notes (Signed)
Pre visit review using our clinic review tool, if applicable. No additional management support is needed unless otherwise documented below in the visit note. 

## 2015-11-27 NOTE — Progress Notes (Signed)
Subjective:    Patient ID: Devin [name], male    DOB: [birth year], 75 y.o.   MRN: [id number]  DOS:  11/27/2015 Type of visit - description : New patient, to get established Interval history: HTN: Good compliance with medication, he check his BP from time to time and is within normal DJD: Taking meloxicam with good results. Depression, insomnia: Good compliance with Prozac, trying to "wean off" nortriptyline. Sleeping well, on average 6 hours at night BPH: Had a procedure last year, doing well, urology would like a copy of his labs   Review of Systems Denies nausea, vomiting, blood in the stools. Occasional cough mostly associated with postnasal dripping. No sputum production or wheezing  Past Medical History  Diagnosis Date  . Tubular adenoma     01/05/2013  . Hyperplastic colon polyp   . Rectal cancer ([place])   . Diverticulosis   . Macular degeneration   . Hernia of abdominal cavity   . History of rectal polyps   . Hypertension   . COPD (chronic obstructive pulmonary disease) ([place])   . H/O [facility] spotted fever 01/27/14  . Hematuria     Past Surgical History  Procedure Laterality Date  . Mastoid gland removal      left ear  . Appendectomy    . Hernia repair    . Colon surgery      rectal cancer ~ 1999  . Tonsillectomy    . Prostate surgery  05/2015    Social History   Social History  . Marital Status: Married    Spouse Name: N/A  . Number of Children: 2  . Years of Education: N/A   Occupational History  . RetiredSocial research officer, government    Social History Main Topics  . Smoking status: Former Smoker    Quit date: 04/19/1989  . Smokeless tobacco: Never Used  . Alcohol Use: 8.4 oz/week    14 Glasses of wine per week     Comment: most days  , amounts varies , sometimes > 2 servings   . Drug Use: No  . Sexual Activity: Not on file   Other Topics Concern  . Not on file   Social History Narrative   Household-- pt, wife   Lost a son ~ 2011   Moved from [place] ~  2011        Medication List       This list is accurate as of: 11/27/15 11:59 PM.  Always use your most recent med list.               aspirin 81 MG tablet  Take 1 tablet (81 mg total) by mouth daily.     calcium carbonate 600 MG Tabs tablet  Commonly known as:  OS-CAL  Take 1 tablet (600 mg total) by mouth daily with breakfast. + vitamin D     clotrimazole-betamethasone cream  Commonly known as:  LOTRISONE  Apply 1 application topically 2 (two) times daily.     FLUoxetine 20 MG tablet  Commonly known as:  PROZAC  Take 1 tablet (20 mg total) by mouth daily.     fluticasone 50 MCG/ACT nasal spray  Commonly known as:  FLONASE  Place 2 sprays into both nostrils daily.     lisinopril 10 MG tablet  Commonly known as:  PRINIVIL,ZESTRIL  TAKE 1 TABLET (10 MG TOTAL) BY MOUTH DAILY.     meloxicam 7.5 MG tablet  Commonly known as:  MOBIC  Take one tablet once  or twice daily as needed for joint pain.     multivitamin with minerals tablet  Take 1 tablet by mouth daily.     nortriptyline 50 MG capsule  Commonly known as:  PAMELOR  TAKE 1 CAPSULE AT BEDTIME     omeprazole 20 MG capsule  Commonly known as:  PRILOSEC  TAKE 1 CAPSULE EVERY DAY           Objective:   Physical Exam BP 124/68 mmHg  Pulse 67  Temp(Src) 98.2 F (36.8 C) (Oral)  Ht 6\' 2"  (1.88 m)  Wt 229 lb 4 oz (103.987 kg)  BMI 29.42 kg/m2  SpO2 97% General:   Well developed, well nourished . NAD.  HEENT:  Normocephalic . Face symmetric, atraumatic Lungs:  CTA B Normal respiratory effort, no intercostal retractions, no accessory muscle use. Heart: RRR,  no murmur.  No pretibial edema bilaterally  Skin: Not pale. Not jaundice Neurologic:  alert & oriented X3.  Speech normal, gait appropriate for age and unassisted Psych--  Cognition and judgment appear intact.  Cooperative with normal attention span and concentration.  Behavior appropriate. No anxious or depressed appearing.        Assessment & Plan:   Assessment HTN COPD  (?) Depression-insomnia, lost a son ~2011 DJD BPH --s/p surgery 2016 , Dr Hazle Nordmann H/o etoh, mild  Rectal cancer-- s/p surgery ~ 1999, no chemo-XRT FH CAD, F MI age 28  PLAN HTN: Continue lisinopril, check a CMP and CBC Depression insomnia: Continue with SSRIs, sxs controlled , sleeping okay and trying to wean off nortriptyline  at night. Reassess on RTC DJD: Taking meloxicam every day, recommend that is okay if he needs it every day however he could skip NSAIDs if sx not severe. Also he can take Tylenol as needed. BPH: We'll send lab results to Dr. Hazle Nordmann EtOH: We discusses that two etoh servings qd are medically safe. RTC 4-6 months

## 2015-11-28 DIAGNOSIS — Z09 Encounter for follow-up examination after completed treatment for conditions other than malignant neoplasm: Secondary | ICD-10-CM | POA: Insufficient documentation

## 2015-11-28 NOTE — Assessment & Plan Note (Signed)
HTN: Continue lisinopril, check a CMP and CBC Depression insomnia: Continue with SSRIs, sxs controlled , sleeping okay and trying to wean off nortriptyline  at night. Reassess on RTC DJD: Taking meloxicam every day, recommend that is okay if he needs it every day however he could skip NSAIDs if sx not severe. Also he can take Tylenol as needed. BPH: We'll send lab results to Dr. Hazle Nordmann EtOH: We discusses that two etoh servings qd are medically safe. RTC 4-6 months

## 2015-12-04 DIAGNOSIS — H353211 Exudative age-related macular degeneration, right eye, with active choroidal neovascularization: Secondary | ICD-10-CM | POA: Diagnosis not present

## 2016-01-28 DIAGNOSIS — H353211 Exudative age-related macular degeneration, right eye, with active choroidal neovascularization: Secondary | ICD-10-CM | POA: Diagnosis not present

## 2016-02-07 DIAGNOSIS — H2512 Age-related nuclear cataract, left eye: Secondary | ICD-10-CM | POA: Diagnosis not present

## 2016-02-07 DIAGNOSIS — H2513 Age-related nuclear cataract, bilateral: Secondary | ICD-10-CM | POA: Diagnosis not present

## 2016-02-07 DIAGNOSIS — H35363 Drusen (degenerative) of macula, bilateral: Secondary | ICD-10-CM | POA: Diagnosis not present

## 2016-02-07 DIAGNOSIS — H353232 Exudative age-related macular degeneration, bilateral, with inactive choroidal neovascularization: Secondary | ICD-10-CM | POA: Diagnosis not present

## 2016-03-12 DIAGNOSIS — H25812 Combined forms of age-related cataract, left eye: Secondary | ICD-10-CM | POA: Diagnosis not present

## 2016-03-12 DIAGNOSIS — H2512 Age-related nuclear cataract, left eye: Secondary | ICD-10-CM | POA: Diagnosis not present

## 2016-03-18 DIAGNOSIS — H25011 Cortical age-related cataract, right eye: Secondary | ICD-10-CM | POA: Diagnosis not present

## 2016-03-18 DIAGNOSIS — H2511 Age-related nuclear cataract, right eye: Secondary | ICD-10-CM | POA: Diagnosis not present

## 2016-03-26 DIAGNOSIS — H353211 Exudative age-related macular degeneration, right eye, with active choroidal neovascularization: Secondary | ICD-10-CM | POA: Diagnosis not present

## 2016-04-02 DIAGNOSIS — H268 Other specified cataract: Secondary | ICD-10-CM | POA: Diagnosis not present

## 2016-04-02 DIAGNOSIS — H2511 Age-related nuclear cataract, right eye: Secondary | ICD-10-CM | POA: Diagnosis not present

## 2016-04-10 DIAGNOSIS — N401 Enlarged prostate with lower urinary tract symptoms: Secondary | ICD-10-CM | POA: Diagnosis not present

## 2016-04-10 DIAGNOSIS — R358 Other polyuria: Secondary | ICD-10-CM | POA: Diagnosis not present

## 2016-04-10 DIAGNOSIS — N138 Other obstructive and reflux uropathy: Secondary | ICD-10-CM | POA: Diagnosis not present

## 2016-04-13 ENCOUNTER — Other Ambulatory Visit: Payer: Self-pay | Admitting: Family Medicine

## 2016-04-24 DIAGNOSIS — N1330 Unspecified hydronephrosis: Secondary | ICD-10-CM | POA: Diagnosis not present

## 2016-04-24 DIAGNOSIS — K5730 Diverticulosis of large intestine without perforation or abscess without bleeding: Secondary | ICD-10-CM | POA: Diagnosis not present

## 2016-04-24 DIAGNOSIS — N320 Bladder-neck obstruction: Secondary | ICD-10-CM | POA: Diagnosis not present

## 2016-04-24 DIAGNOSIS — R1084 Generalized abdominal pain: Secondary | ICD-10-CM | POA: Diagnosis not present

## 2016-04-29 ENCOUNTER — Ambulatory Visit: Payer: Commercial Managed Care - HMO | Admitting: Internal Medicine

## 2016-05-06 DIAGNOSIS — N320 Bladder-neck obstruction: Secondary | ICD-10-CM | POA: Diagnosis not present

## 2016-05-12 DIAGNOSIS — Z01818 Encounter for other preprocedural examination: Secondary | ICD-10-CM | POA: Diagnosis not present

## 2016-05-12 DIAGNOSIS — N320 Bladder-neck obstruction: Secondary | ICD-10-CM | POA: Diagnosis not present

## 2016-05-14 DIAGNOSIS — R109 Unspecified abdominal pain: Secondary | ICD-10-CM | POA: Diagnosis not present

## 2016-05-14 DIAGNOSIS — J449 Chronic obstructive pulmonary disease, unspecified: Secondary | ICD-10-CM | POA: Diagnosis not present

## 2016-05-14 DIAGNOSIS — K219 Gastro-esophageal reflux disease without esophagitis: Secondary | ICD-10-CM | POA: Diagnosis not present

## 2016-05-14 DIAGNOSIS — I10 Essential (primary) hypertension: Secondary | ICD-10-CM | POA: Diagnosis not present

## 2016-05-14 DIAGNOSIS — N323 Diverticulum of bladder: Secondary | ICD-10-CM | POA: Diagnosis not present

## 2016-05-14 DIAGNOSIS — Z79899 Other long term (current) drug therapy: Secondary | ICD-10-CM | POA: Diagnosis not present

## 2016-05-14 DIAGNOSIS — Z87891 Personal history of nicotine dependence: Secondary | ICD-10-CM | POA: Diagnosis not present

## 2016-05-14 DIAGNOSIS — N135 Crossing vessel and stricture of ureter without hydronephrosis: Secondary | ICD-10-CM | POA: Diagnosis not present

## 2016-05-14 DIAGNOSIS — Z466 Encounter for fitting and adjustment of urinary device: Secondary | ICD-10-CM | POA: Diagnosis not present

## 2016-05-14 DIAGNOSIS — N320 Bladder-neck obstruction: Secondary | ICD-10-CM | POA: Diagnosis not present

## 2016-05-14 DIAGNOSIS — Z7982 Long term (current) use of aspirin: Secondary | ICD-10-CM | POA: Diagnosis not present

## 2016-05-14 DIAGNOSIS — N3289 Other specified disorders of bladder: Secondary | ICD-10-CM | POA: Diagnosis not present

## 2016-05-15 DIAGNOSIS — Z87891 Personal history of nicotine dependence: Secondary | ICD-10-CM | POA: Diagnosis not present

## 2016-05-15 DIAGNOSIS — N135 Crossing vessel and stricture of ureter without hydronephrosis: Secondary | ICD-10-CM | POA: Diagnosis not present

## 2016-05-15 DIAGNOSIS — I10 Essential (primary) hypertension: Secondary | ICD-10-CM | POA: Diagnosis not present

## 2016-05-15 DIAGNOSIS — Z79899 Other long term (current) drug therapy: Secondary | ICD-10-CM | POA: Diagnosis not present

## 2016-05-15 DIAGNOSIS — Z7982 Long term (current) use of aspirin: Secondary | ICD-10-CM | POA: Diagnosis not present

## 2016-05-15 DIAGNOSIS — N320 Bladder-neck obstruction: Secondary | ICD-10-CM | POA: Diagnosis not present

## 2016-05-15 DIAGNOSIS — K219 Gastro-esophageal reflux disease without esophagitis: Secondary | ICD-10-CM | POA: Diagnosis not present

## 2016-05-15 DIAGNOSIS — N323 Diverticulum of bladder: Secondary | ICD-10-CM | POA: Diagnosis not present

## 2016-05-15 DIAGNOSIS — J449 Chronic obstructive pulmonary disease, unspecified: Secondary | ICD-10-CM | POA: Diagnosis not present

## 2016-05-17 DIAGNOSIS — Z87891 Personal history of nicotine dependence: Secondary | ICD-10-CM | POA: Diagnosis not present

## 2016-05-17 DIAGNOSIS — R34 Anuria and oliguria: Secondary | ICD-10-CM | POA: Diagnosis not present

## 2016-05-17 DIAGNOSIS — R339 Retention of urine, unspecified: Secondary | ICD-10-CM | POA: Diagnosis not present

## 2016-05-17 DIAGNOSIS — Z09 Encounter for follow-up examination after completed treatment for conditions other than malignant neoplasm: Secondary | ICD-10-CM | POA: Diagnosis not present

## 2016-05-19 DIAGNOSIS — R339 Retention of urine, unspecified: Secondary | ICD-10-CM | POA: Diagnosis not present

## 2016-05-26 DIAGNOSIS — H353211 Exudative age-related macular degeneration, right eye, with active choroidal neovascularization: Secondary | ICD-10-CM | POA: Diagnosis not present

## 2016-05-29 DIAGNOSIS — T191XXA Foreign body in bladder, initial encounter: Secondary | ICD-10-CM | POA: Diagnosis not present

## 2016-05-29 DIAGNOSIS — R310 Gross hematuria: Secondary | ICD-10-CM | POA: Diagnosis not present

## 2016-06-13 ENCOUNTER — Ambulatory Visit (INDEPENDENT_AMBULATORY_CARE_PROVIDER_SITE_OTHER): Payer: Commercial Managed Care - HMO | Admitting: Family Medicine

## 2016-06-13 ENCOUNTER — Telehealth: Payer: Self-pay | Admitting: Internal Medicine

## 2016-06-13 ENCOUNTER — Ambulatory Visit (HOSPITAL_BASED_OUTPATIENT_CLINIC_OR_DEPARTMENT_OTHER)
Admission: RE | Admit: 2016-06-13 | Discharge: 2016-06-13 | Disposition: A | Discharge status: Home / Self Care | Payer: Commercial Managed Care - HMO | Source: Ambulatory Visit | Attending: Family Medicine | Admitting: Family Medicine

## 2016-06-13 ENCOUNTER — Ambulatory Visit: Payer: Self-pay | Admitting: Internal Medicine

## 2016-06-13 ENCOUNTER — Encounter: Payer: Self-pay | Admitting: Family Medicine

## 2016-06-13 VITALS — BP 126/60 | HR 89 | Temp 97.9°F | Ht 74.0 in | Wt 225.4 lb

## 2016-06-13 DIAGNOSIS — M899 Disorder of bone, unspecified: Secondary | ICD-10-CM | POA: Diagnosis not present

## 2016-06-13 DIAGNOSIS — M25512 Pain in left shoulder: Secondary | ICD-10-CM | POA: Diagnosis not present

## 2016-06-13 DIAGNOSIS — M7062 Trochanteric bursitis, left hip: Secondary | ICD-10-CM

## 2016-06-13 DIAGNOSIS — M7502 Adhesive capsulitis of left shoulder: Secondary | ICD-10-CM

## 2016-06-13 DIAGNOSIS — Z23 Encounter for immunization: Secondary | ICD-10-CM | POA: Diagnosis not present

## 2016-06-13 DIAGNOSIS — M19012 Primary osteoarthritis, left shoulder: Secondary | ICD-10-CM | POA: Diagnosis not present

## 2016-06-13 MED ORDER — LIDOCAINE HCL 1 % IJ SOLN
2.0000 mL | Freq: Once | INTRAMUSCULAR | Status: AC
  Administered 2016-06-13: 2 mL

## 2016-06-13 MED ORDER — METHYLPREDNISOLONE ACETATE 40 MG/ML IJ SUSP
40.0000 mg | Freq: Once | INTRAMUSCULAR | Status: AC
Start: 2016-06-13 — End: 2016-06-13
  Administered 2016-06-13: 40 mg via INTRAMUSCULAR

## 2016-06-13 NOTE — Progress Notes (Signed)
Musculoskeletal Exam  Patient: Devin [name] DOB: [birth year]  DOS: 06/13/2016  SUBJECTIVE:  Chief Complaint:   Chief Complaint  Patient presents with  . Arthritis    all over    [name] is a 75 y.o.  male for evaluation and treatment of pain.   Onset:  2 years ago. Sudden.  Location: Shoulder and neck region in addition to his left outer hip Character:  sharp  Progression of issue:  Significantly worsened - he is unable to garden and do daily activities that he enjoys due to the pain Associated symptoms: Decreased range of motion in the shoulder Treatment: to date has been prescription NSAIDS. Helped at first, but not any longer.   Neurovascular symptoms: no  ROS: Musculoskeletal/Extremities: +Shoulder and hip pain Neurologic: no numbness, tingling no weakness   Past Medical History:  Diagnosis Date  . COPD (chronic obstructive pulmonary disease) ([place])   . Diverticulosis   . H/O [facility] spotted fever 01/27/14  . Hematuria   . Hernia of abdominal cavity   . History of rectal polyps   . Hyperplastic colon polyp   . Hypertension   . Macular degeneration   . Rectal cancer ([place])   . Tubular adenoma    01/05/2013   Past Surgical History:  Procedure Laterality Date  . APPENDECTOMY    . COLON SURGERY     rectal cancer ~ 1999  . HERNIA REPAIR    . mastoid gland removal     left ear  . PROSTATE SURGERY  05/2015  . TONSILLECTOMY     Family History  Problem Relation Age of Onset  . Breast cancer Mother   . Aneurysm Father   . Heart attack Father     76  . Aneurysm Son   . Stroke Paternal Grandfather   . Cancer Paternal Grandmother   . Cancer Maternal Grandmother   . Colon cancer Mother     Colon cancer  . Prostate cancer Neg Hx    Current Outpatient Prescriptions  Medication Sig Dispense Refill  . aspirin 81 MG tablet Take 1 tablet (81 mg total) by mouth daily. 30 tablet   . calcium carbonate (OS-CAL) 600 MG TABS tablet Take 1 tablet (600 mg total) by  mouth daily with breakfast. + vitamin D 60 tablet   . clotrimazole-betamethasone (LOTRISONE) cream Apply 1 application topically 2 (two) times daily. 90 g 1  . fluticasone (FLONASE) 50 MCG/ACT nasal spray Place 2 sprays into both nostrils daily. (Patient taking differently: Place 2 sprays into both nostrils daily as needed. ) 48 g 1  . lisinopril (PRINIVIL,ZESTRIL) 10 MG tablet Take 1 tablet (10 mg total) by mouth daily. 90 tablet 0  . meloxicam (MOBIC) 7.5 MG tablet Take one tablet once or twice daily as needed for joint pain.    . Multiple Vitamins-Minerals (MULTIVITAMIN WITH MINERALS) tablet Take 1 tablet by mouth daily.    . nortriptyline (PAMELOR) 50 MG capsule Take 1 capsule (50 mg total) by mouth at bedtime. 90 capsule 0  . omeprazole (PRILOSEC) 20 MG capsule TAKE 1 CAPSULE EVERY DAY 90 capsule 1   No Known Allergies Social History   Social History  . Marital status: Married  . Number of children: 2   Occupational History  . RetiredSocial research officer, government    Social History Main Topics  . Smoking status: Former Smoker    Quit date: 04/19/1989  . Smokeless tobacco: Never Used  . Alcohol use 8.4 oz/week    14  Glasses of wine per week     Comment: most days  , amounts varies , sometimes > 2 servings   . Drug use: No   Social History Narrative   Household-- pt, wife   Lost a son ~ 2011   Moved from [place] ~ 2011    Objective:  VITAL SIGNS: BP 126/60 (BP Location: Left Arm, Patient Position: Sitting, Cuff Size: Large)   Pulse 89   Temp 97.9 F (36.6 C) (Oral)   Ht 6\' 2"  (1.88 m)   Wt 225 lb 6.4 oz (102.2 kg)   SpO2 95%   BMI 28.94 kg/m  Constitutional: Well formed, well developed. No acute distress. HENT: Normocephalic, atraumatic.  Moist mucous membranes.  Eyes:  EOM grossly intact. Conjunctiva normal.  Neck:  Full range of motion.   Cardiovascular: RRR, Brisk capillary refill Thorax & Lungs:  Normal effort, no accessory muscle use Extremities: No clubbing. No cyanosis. No  edema.  Skin: Warm. Dry. No erythema. No rash.  Musculoskeletal: Shoulder.   Normal active range of motion: no, decreased.   Normal passive range of motion: no Tenderness to palpation: no Deformity: no Ecchymosis: no Tests positive: +empty can, Neer's, Hawkins,  Tests negative: Obriens, crossover, liftoff  Left hip Normal range of motion Tender to palpation over the trochanter Negative logroll, Stinchfield, Patrick's, and FADDIR Neurologic: Normal sensory function. No focal deficits noted. DTR's equal and symmetry in LE's. No clonus. Psychiatric: Normal mood. Age appropriate judgment and insight. Alert & oriented x 3.    Procedure note; bursa injection Informed consent obtained. The areas of interest were demarcated with an otoscope speculum tip. There were then cleaned with alcohol. The area was injected with 40 mg of Depomedrol and 2 mL of 1% lidocaine. The area was then bandaged. There were no complications noted. The patient tolerated the procedure well.   Assessment:  Left shoulder pain - Plan: DG Shoulder Left, Ambulatory referral to Physical Therapy  Greater trochanteric bursitis, left - Plan: PR DRAIN/INJECT LARGE JOINT/BURSA, methylPREDNISolone acetate (DEPO-MEDROL) injection 40 mg, lidocaine (XYLOCAINE) 1 % (with pres) injection 2 mL  Adhesive capsulitis, left - Plan: Ambulatory referral to Physical Therapy  Encounter for immunization - Plan: DG Shoulder Left, Ambulatory referral to Physical Therapy, Flu Vaccine QUAD 36+ mos IM  Plan: Orders as above. The x-ray shows mild degenerative changes of the glenohumeral joint. I do not appreciate significant impingement or loss of subacromial space. I await the final read. Gen. stretching handout was provided for shoulder. As this has been a chronic issue, I do not feel we need to rush him to orthopedic surgery or obtain an MRI given his equivocal examination. If he is not improving, we could inject or subsequently get the  MRI. For his bursitis, we did inject. I hope it will last at least 3 months. If he fails to have any improvement, will consider physical therapy for gluteus medius tendinopathy. Follow-up as needed. The patient voiced understanding and agreement to the plan.  Allen, DO 06/13/16  5:11 PM

## 2016-06-13 NOTE — Progress Notes (Signed)
Pre visit review using our clinic review tool, if applicable. No additional management support is needed unless otherwise documented below in the visit note. 

## 2016-06-13 NOTE — Patient Instructions (Addendum)
Shoulder Range of Motion Exercises Shoulder range of motion (ROM) exercises are designed to keep the shoulder moving freely. They are often recommended for people who have shoulder pain. MOVEMENT EXERCISE When you are able, do this exercise 5-6 days per week, or as told by your health care provider. Work toward doing 2 sets of 10 swings. Pendulum Exercise How To Do This Exercise Lying Down 1. Lie face-down on a bed with your abdomen close to the side of the bed. 2. Let your arm hang over the side of the bed. 3. Relax your shoulder, arm, and hand. 4. Slowly and gently swing your arm forward and back. Do not use your neck muscles to swing your arm. They should be relaxed. If you are struggling to swing your arm, have someone gently swing it for you. When you do this exercise for the first time, swing your arm at a 15 degree angle for 15 seconds, or swing your arm 10 times. As pain lessens over time, increase the angle of the swing to 30-45 degrees. 5. Repeat steps 1-4 with the other arm. How To Do This Exercise While Standing 1. Stand next to a sturdy chair or table and hold on to it with your hand.  Bend forward at the waist.  Bend your knees slightly.  Relax your other arm and let it hang limp.  Relax the shoulder blade of the arm that is hanging and let it drop.  While keeping your shoulder relaxed, use body motion to swing your arm in small circles. The first time you do this exercise, swing your arm for about 30 seconds or 10 times. When you do it next time, swing your arm for a little longer.  Stand up tall and relax.  Repeat steps 1-7, this time changing the direction of the circles. 2. Repeat steps 1-8 with the other arm. STRETCHING EXERCISES Do these exercises 3-4 times per day on 5-6 days per week or as told by your health care provider. Work toward holding the stretch for 20 seconds. Stretching Exercise 1 1. Lift your arm straight out in front of you. 2. Bend your arm 90  degrees at the elbow (right angle) so your forearm goes across your body and looks like the letter "L." 3. Use your other arm to gently pull the elbow forward and across your body. 4. Repeat steps 1-3 with the other arm. Stretching Exercise 2 You will need a towel or rope for this exercise. 1. Bend one arm behind your back with the palm facing outward. 2. Hold a towel with your other hand. 3. Reach the arm that holds the towel above your head, and bend that arm at the elbow. Your wrist should be behind your neck. 4. Use your free hand to grab the free end of the towel. 5. With the higher hand, gently pull the towel up behind you. 6. With the lower hand, pull the towel down behind you. 7. Repeat steps 1-6 with the other arm. STRENGTHENING EXERCISES Do each of these exercises at four different times of day (sessions) every day or as told by your health care provider. To begin with, repeat each exercise 5 times (repetitions). Work toward doing 3 sets of 12 repetitions or as told by your health care provider. Strengthening Exercise 1 You will need a light weight for this activity. As you grow stronger, you may use a heavier weight. 1. Standing with a weight in your hand, lift your arm straight out to the side   until it is at the same height as your shoulder. 2. Bend your arm at 90 degrees so that your fingers are pointing to the ceiling. 3. Slowly raise your hand until your arm is straight up in the air. 4. Repeat steps 1-3 with the other arm. Strengthening Exercise 2 You will need a light weight for this activity. As you grow stronger, you may use a heavier weight. 1. Standing with a weight in your hand, gradually move your straight arm in an arc, starting at your side, then out in front of you, then straight up over your head. 2. Gradually move your other arm in an arc, starting at your side, then out in front of you, then straight up over your head. 3. Repeat steps 1-2 with the other  arm. Strengthening Exercise 3 You will need an elastic band for this activity. As you grow stronger, gradually increase the size of the bands or increase the number of bands that you use at one time. 1. While standing, hold an elastic band in one hand and raise that arm up in the air. 2. With your other hand, pull down the band until that hand is by your side. 3. Repeat steps 1-2 with the other arm.   This information is not intended to replace advice given to you by your health care provider. Make sure you discuss any questions you have with your health care provider.   Document Released: 06/14/2003 Document Revised: 01/30/2015 Document Reviewed: 09/11/2014 Elsevier Interactive Patient Education 2016 Elsevier Inc.  

## 2016-06-13 NOTE — Telephone Encounter (Signed)
Caller name:Rosemond,Jo Relation to pt:  Spouse  Call back number:[phone]   Reason for call:   Wife called stating patient came home very upset due to the fact patient had a 1:15pm appointment today and patient arrived at 1:27pm. As per Kennyth Lose skpe was sent back to PCP CMA inquiring if patient can still be seen, CMA responded back "no, patient would have to [facility]". Wife stated she doesn't believe CMA asked provider due to the fact CMA responded to quickly. Spouse stated PCP would have seen the patient due to the circumstances of appointment. 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 Larose Kells knows [place] and would have seen the patient. Kennyth Lose offered patient another appointment with another provider, patient denied and stated it was not fair because he was caught in traffic. Patient states I am never late to any of my appointments and stated he's considering finding another physician.

## 2016-06-14 ENCOUNTER — Other Ambulatory Visit: Payer: Self-pay | Admitting: Internal Medicine

## 2016-06-16 ENCOUNTER — Telehealth: Payer: Self-pay | Admitting: Internal Medicine

## 2016-06-16 MED ORDER — MELOXICAM 7.5 MG PO TABS: 15.0000 mg | ORAL_TABLET | Freq: Every day | ORAL | 1 refills | Status: DC

## 2016-06-16 MED ORDER — LISINOPRIL 10 MG PO TABS: 10.0000 mg | ORAL_TABLET | Freq: Every day | ORAL | 0 refills | Status: DC

## 2016-06-16 NOTE — Telephone Encounter (Signed)
Pt seen by Dr. Wendling.  

## 2016-06-16 NOTE — Telephone Encounter (Signed)
Pt seen by Dr. Nani Ravens on 06/13/16.

## 2016-06-16 NOTE — Telephone Encounter (Signed)
180 called in.

## 2016-06-16 NOTE — Telephone Encounter (Signed)
Please advise.//AB/CMA 

## 2016-06-16 NOTE — Telephone Encounter (Signed)
Relation to [id number] Call back Leland: Rippey   Reason for call:  Patient requesting 180 tablets of meloxicam (MOBIC) 7.5 MG tablet due to direction change. Please advise patient directly.

## 2016-06-17 ENCOUNTER — Other Ambulatory Visit: Payer: Self-pay | Admitting: Family Medicine

## 2016-06-18 ENCOUNTER — Telehealth: Payer: Self-pay | Admitting: *Deleted

## 2016-06-18 MED ORDER — MELOXICAM 7.5 MG PO TABS: 15.0000 mg | ORAL_TABLET | Freq: Every day | ORAL | 1 refills | Status: DC

## 2016-06-18 NOTE — Telephone Encounter (Signed)
Rx sent to the pharmacy by e-script.//AB/CMA 

## 2016-06-23 ENCOUNTER — Ambulatory Visit: Payer: Commercial Managed Care - HMO | Admitting: Physical Therapy

## 2016-08-29 DIAGNOSIS — N138 Other obstructive and reflux uropathy: Secondary | ICD-10-CM | POA: Diagnosis not present

## 2016-08-29 DIAGNOSIS — N401 Enlarged prostate with lower urinary tract symptoms: Secondary | ICD-10-CM | POA: Diagnosis not present

## 2016-09-05 ENCOUNTER — Telehealth: Payer: Self-pay | Admitting: Internal Medicine

## 2016-09-05 NOTE — Telephone Encounter (Signed)
11/27/14 PR PPPS, SUBSEQ VISIT [id number] scheduled with Angel for Monday 09/08/16 at 12 noon.

## 2016-09-05 NOTE — Progress Notes (Signed)
Pre visit review using our clinic review tool, if applicable. No additional management support is needed unless otherwise documented below in the visit note. 

## 2016-09-05 NOTE — Progress Notes (Addendum)
Subjective:   Devin [name] is a 75 y.o. male who presents for Medicare Annual/Subsequent preventive examination.  Review of Systems:  No ROS.  Medicare Wellness Visit.  Cardiac Risk Factors include: advanced age (>31men, >21 women);hypertension;male gender Sleep patterns: Sleeps 6 hrs per night. Feels rested. 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 Living environment; residence and Firearm Safety: Lives at home with wife. 2 story home. Feels safe. Seat Belt Safety/Bike Helmet:   Counseling:   Eye Exam- Follows with Dr.Weaver as needed. Post cataract sx. Dental- Dr.Fielden every 6 months.  Male:   CCS- Last 05/18/14: Polyps found were removed. There was severe diverticulosis noted in the sigmoid, descending colon, transverse colon, ascending colon, and at the cecum. The colon was otherwise normal. Dr.Kaplan recommended 3 yr f/u. PSA-   Lab Results  Component Value Date   PSA 3.4 02/06/2015        Objective:    Vitals: BP 128/68 (BP Location: Right Arm, Patient Position: Sitting, Cuff Size: Large)   Pulse 76   Ht 6\' 2"  (1.88 m)   Wt 226 lb (102.5 kg)   SpO2 97%   BMI 29.02 kg/m   Body mass index is 29.02 kg/m.  Tobacco History  Smoking Status  . Former Smoker  . Quit date: 04/19/1989  Smokeless Tobacco  . Never Used     Counseling given: Not Answered   Past Medical History:  Diagnosis Date  . COPD (chronic obstructive pulmonary disease) ([place])   . Diverticulosis   . H/O [facility] spotted fever 01/27/14  . Hematuria   . Hernia of abdominal cavity   . History of rectal polyps   . Hyperplastic colon polyp   . Hypertension   . Macular degeneration   . Rectal cancer ([place])   . Tubular adenoma    01/05/2013   Past Surgical History:  Procedure Laterality Date  . APPENDECTOMY    . COLON SURGERY     rectal cancer ~ 1999  . EYE SURGERY     cataract sx  . HERNIA REPAIR    . mastoid gland removal     left ear  . PROSTATE  SURGERY  05/2015  . TONSILLECTOMY     Family History  Problem Relation Age of Onset  . Breast cancer Mother   . Colon cancer Mother     Colon cancer  . Aneurysm Father   . Heart attack Father     78  . Aneurysm Son   . Stroke Paternal Grandfather   . Cancer Paternal Grandmother   . Cancer Maternal Grandmother   . Prostate cancer Neg Hx    History  Sexual Activity  . Sexual activity: Not on file    Outpatient Encounter Prescriptions as of 09/08/2016  Medication Sig  . aspirin 81 MG tablet Take 1 tablet (81 mg total) by mouth daily.  . Bilberry, Vaccinium myrtillus, (BILBERRY FRUIT PO) Take 1 tablet by mouth.  . calcium carbonate (OS-CAL) 600 MG TABS tablet Take 1 tablet (600 mg total) by mouth daily with breakfast. + vitamin D  . clotrimazole-betamethasone (LOTRISONE) cream Apply 1 application topically 2 (two) times daily.  . fluticasone (FLONASE) 50 MCG/ACT nasal spray Place 2 sprays into both nostrils daily. (Patient taking differently: Place 2 sprays into both nostrils daily as needed. )  . lisinopril (PRINIVIL,ZESTRIL) 10 MG tablet Take 1 tablet (10 mg total) by mouth daily.  . meloxicam (MOBIC) 7.5 MG tablet Take 2 tablets (15  mg total) by mouth daily. Take one tablet once or twice daily as needed for joint pain.  . Multiple Vitamins-Minerals (MULTIVITAMIN WITH MINERALS) tablet Take 1 tablet by mouth daily.  . multivitamin-lutein (OCUVITE-LUTEIN) CAPS capsule Take 1 capsule by mouth daily.  . nortriptyline (PAMELOR) 50 MG capsule Take 1 capsule (50 mg total) by mouth at bedtime.  Marland Kitchen omeprazole (PRILOSEC) 20 MG capsule Take 1 capsule (20 mg total) by mouth daily.   No facility-administered encounter medications on file as of 09/08/2016.     Activities of Daily Living In your present state of health, do you have any difficulty performing the following activities: 09/08/2016 11/27/2015  Hearing? Y N  Vision? N N  Difficulty concentrating or making decisions? N N  Walking or  climbing stairs? N N  Dressing or bathing? N N  Doing errands, shopping? N N  Preparing Food and eating ? N -  Using the Toilet? N -  In the past six months, have you accidently leaked urine? N -  Do you have problems with loss of bowel control? N -  Managing your Medications? N -  Managing your Finances? N -  Housekeeping or managing your Housekeeping? N -  Some recent data might be hidden    Patient Care Team: Colon Branch, MD as PCP - General (Internal Medicine) Milana Na, MD as Consulting Physician (General Surgery) Nevada Crane, MD as Consulting Physician (Internal Medicine) Hurman Horn, MD as Consulting Physician (Ophthalmology)   Assessment:    Physical assessment deferred to PCP.  Exercise Activities and Dietary recommendations   Diet (meal preparation, eat out, water intake, caffeinated beverages, dairy products, fruits and vegetables): in general, a "healthy" diet  , well balanced, low fat/ cholesterol, low salt. Drinks at least 5 glasses of water per day.     Goals    . Maintain healty lifestyle      Fall Risk Fall Risk  09/08/2016 11/27/2015 11/27/2014 04/19/2014  Falls in the past year? Yes No No No  Number falls in past yr: 1 - - -  Injury with Fall? No - - -   Depression Screen PHQ 2/9 Scores 09/08/2016 11/27/2015 11/27/2014 04/19/2014  PHQ - 2 Score 0 0 0 0    Cognitive Function MMSE - Mini Mental State Exam 09/08/2016  Orientation to time 5  Orientation to Place 5  Registration 3  Attention/ Calculation 5  Recall 3  Language- name 2 objects 2  Language- repeat 1  Language- follow 3 step command 3  Language- read & follow direction 1  Write a sentence 1  Copy design 1  Total score 30        Immunization History  Administered Date(s) Administered  . Influenza,inj,Quad PF,36+ Mos 07/27/2014, 05/30/2015, 06/13/2016  . Pneumococcal Conjugate-13 07/27/2014  . Pneumococcal-Unspecified 09/30/2007  . Tdap 05/26/2007  . Zoster 09/30/2007     Screening Tests Health Maintenance  Topic Date Due  . COLONOSCOPY  05/18/2017  . TETANUS/TDAP  05/25/2017  . INFLUENZA VACCINE  Completed  . ZOSTAVAX  Completed  . PNA vac Low Risk Adult  Completed      Plan:     Bring a copy of your advance directives to your next office visit. Continue to eat heart healthy diet (full of fruits, vegetables, whole grains, lean protein, water--limit salt, fat, and sugar intake) and increase physical activity as tolerated. Happy Holidays!!  During the course of the visit the patient was educated and counseled about the  following appropriate screening and preventive services:   Vaccines to include Pneumoccal, Influenza, Hepatitis B, Td, Zostavax, HCV  Cardiovascular Disease  Colorectal cancer screening  Diabetes screening  Prostate Cancer Screening  Glaucoma screening  Nutrition counseling   Patient Instructions (the written plan) was given to the patient.    Naaman Plummer [place], [place]  09/08/2016   Kathlene November, MD

## 2016-09-08 ENCOUNTER — Encounter: Payer: Self-pay | Admitting: *Deleted

## 2016-09-08 ENCOUNTER — Ambulatory Visit (INDEPENDENT_AMBULATORY_CARE_PROVIDER_SITE_OTHER): Payer: Commercial Managed Care - HMO | Admitting: *Deleted

## 2016-09-08 VITALS — BP 128/68 | HR 76 | Ht 74.0 in | Wt 226.0 lb

## 2016-09-08 DIAGNOSIS — Z0000 Encounter for general adult medical examination without abnormal findings: Secondary | ICD-10-CM | POA: Diagnosis not present

## 2016-09-08 NOTE — Patient Instructions (Signed)
Bring a copy of your advance directives to your next office visit. Continue to eat heart healthy diet (full of fruits, vegetables, whole grains, lean protein, water--limit salt, fat, and sugar intake) and increase physical activity as tolerated. Happy Holidays!!

## 2016-10-07 DIAGNOSIS — H353211 Exudative age-related macular degeneration, right eye, with active choroidal neovascularization: Secondary | ICD-10-CM | POA: Diagnosis not present

## 2016-10-07 DIAGNOSIS — H353121 Nonexudative age-related macular degeneration, left eye, early dry stage: Secondary | ICD-10-CM | POA: Diagnosis not present

## 2016-10-13 ENCOUNTER — Encounter: Payer: Self-pay | Admitting: Internal Medicine

## 2016-10-13 ENCOUNTER — Ambulatory Visit (INDEPENDENT_AMBULATORY_CARE_PROVIDER_SITE_OTHER): Payer: Medicare HMO | Admitting: Internal Medicine

## 2016-10-13 VITALS — BP 118/78 | HR 76 | Temp 97.8°F | Resp 14 | Ht 74.0 in | Wt 226.4 lb

## 2016-10-13 DIAGNOSIS — J40 Bronchitis, not specified as acute or chronic: Secondary | ICD-10-CM

## 2016-10-13 MED ORDER — DOXYCYCLINE HYCLATE 100 MG PO TABS: 100.0000 mg | ORAL_TABLET | Freq: Two times a day (BID) | ORAL | 0 refills | Status: DC

## 2016-10-13 NOTE — Progress Notes (Signed)
Subjective:    Patient ID: Devin [name], male    DOB: 01-16-1941, 76 y.o.   MRN: [id number]  DOS:  10/13/2016 Type of visit - description : acute Interval history: Symptoms started approximately 10 days ago with sinus congestion, subsequently developed chest congestion and cough with gray sputum. No hemoptysis. Taking Mucinex with mild relief.  Review of Systems Denies fever chills No nausea or vomiting No myalgias No chest pain, some shortness of breath with episodes of cough. No wheezing.  Past Medical History:  Diagnosis Date  . COPD (chronic obstructive pulmonary disease) ([place])   . Diverticulosis   . H/O [facility] spotted fever 01/27/14  . Hematuria   . Hernia of abdominal cavity   . History of rectal polyps   . Hyperplastic colon polyp   . Hypertension   . Macular degeneration   . Rectal cancer ([place])   . Tubular adenoma    01/05/2013    Past Surgical History:  Procedure Laterality Date  . APPENDECTOMY    . COLON SURGERY     rectal cancer ~ 1999  . EYE SURGERY     cataract sx  . HERNIA REPAIR    . mastoid gland removal     left ear  . PROSTATE SURGERY  05/2015  . TONSILLECTOMY      Social History   Social History  . Marital status: Married    Spouse name: N/A  . Number of children: 2  . Years of education: N/A   Occupational History  . RetiredSocial research officer, government    Social History Main Topics  . Smoking status: Former Smoker    Quit date: 04/19/1989  . Smokeless tobacco: Never Used  . Alcohol use 8.4 oz/week    14 Glasses of wine per week     Comment: most days  , amounts varies , sometimes > 2 servings   . Drug use: No  . Sexual activity: Not on file   Other Topics Concern  . Not on file   Social History Narrative   Household-- pt, wife   Lost a son ~ 2011   Moved from [place] ~ 2011      Allergies as of 10/13/2016   No Known Allergies     Medication List       Accurate as of 10/13/16 11:59 PM. Always use your most recent med list.            aspirin 81 MG tablet Take 1 tablet (81 mg total) by mouth daily.   BILBERRY FRUIT PO Take 1 tablet by mouth.   calcium carbonate 600 MG Tabs tablet Commonly known as:  OS-CAL Take 1 tablet (600 mg total) by mouth daily with breakfast. + vitamin D   clotrimazole-betamethasone cream Commonly known as:  LOTRISONE Apply 1 application topically 2 (two) times daily.   doxycycline 100 MG tablet Commonly known as:  VIBRA-TABS Take 1 tablet (100 mg total) by mouth 2 (two) times daily.   fluticasone 50 MCG/ACT nasal spray Commonly known as:  FLONASE Place 2 sprays into both nostrils daily.   lisinopril 10 MG tablet Commonly known as:  PRINIVIL,ZESTRIL Take 1 tablet (10 mg total) by mouth daily.   meloxicam 7.5 MG tablet Commonly known as:  MOBIC Take 2 tablets (15 mg total) by mouth daily. Take one tablet once or twice daily as needed for joint pain.   multivitamin-lutein Caps capsule Take 1 capsule by mouth daily.   nortriptyline 50 MG capsule Commonly known as:  PAMELOR Take 1 capsule (50 mg total) by mouth at bedtime.   omeprazole 20 MG capsule Commonly known as:  PRILOSEC Take 1 capsule (20 mg total) by mouth daily.          Objective:   Physical Exam BP 118/78 (BP Location: Left Arm, Patient Position: Sitting, Cuff Size: Normal)   Pulse 76   Temp 97.8 F (36.6 C) (Oral)   Resp 14   Ht 6\' 2"  (1.88 m)   Wt 226 lb 6 oz (102.7 kg)   SpO2 94%   BMI 29.06 kg/m  General:   Well developed, well nourished . NAD.  HEENT:  Normocephalic . Face symmetric, atraumatic. TMs normal, nose is slightly congested, sinuses no TTP. Throat symmetric and normal rate Lungs:  Decrease breath sounds but otherwise clear Normal respiratory effort, no intercostal retractions, no accessory muscle use. Heart: RRR,  no murmur.  No pretibial edema bilaterally  Skin: Not pale. Not jaundice Neurologic:  alert & oriented X3.  Speech normal, gait appropriate for age and  unassisted Psych--  Cognition and judgment appear intact.  Cooperative with normal attention span and concentration.  Behavior appropriate. No anxious or depressed appearing.      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 Will treat with doxycycline and supportive therapy. Due for a CPX, encouraged to schedule a visit.

## 2016-10-13 NOTE — Patient Instructions (Addendum)
Please schedule an appointment to see me , physical exam , fasting, within a months [employer], ok to put two 15-min slots together )  ----------------------------------- Rest, fluids , tylenol  For cough:  Take Mucinex DM twice a day as needed until better  For nasal congestion: Use OTC Nasocort or Flonase : 2 nasal sprays on each side of the nose in the morning until you feel better  Avoid decongestants such as  Pseudoephedrine or phenylephrine     Take the antibiotic as prescribed  (doxycycline)  Call if not gradually better over the next  10 days  Call anytime if the symptoms are severe

## 2016-10-13 NOTE — Progress Notes (Signed)
Pre visit review using our clinic review tool, if applicable. No additional management support is needed unless otherwise documented below in the visit note. 

## 2016-10-14 ENCOUNTER — Encounter: Payer: Self-pay | Admitting: Internal Medicine

## 2016-10-14 ENCOUNTER — Ambulatory Visit (INDEPENDENT_AMBULATORY_CARE_PROVIDER_SITE_OTHER): Payer: Medicare HMO | Admitting: Internal Medicine

## 2016-10-14 VITALS — BP 124/78 | HR 66 | Temp 97.7°F | Resp 14 | Ht 74.0 in | Wt 226.4 lb

## 2016-10-14 DIAGNOSIS — F32A Depression, unspecified: Secondary | ICD-10-CM

## 2016-10-14 DIAGNOSIS — G4700 Insomnia, unspecified: Secondary | ICD-10-CM

## 2016-10-14 DIAGNOSIS — E559 Vitamin D deficiency, unspecified: Secondary | ICD-10-CM | POA: Diagnosis not present

## 2016-10-14 DIAGNOSIS — M150 Primary generalized (osteo)arthritis: Secondary | ICD-10-CM

## 2016-10-14 DIAGNOSIS — F329 Major depressive disorder, single episode, unspecified: Secondary | ICD-10-CM

## 2016-10-14 DIAGNOSIS — I10 Essential (primary) hypertension: Secondary | ICD-10-CM | POA: Diagnosis not present

## 2016-10-14 DIAGNOSIS — Z0000 Encounter for general adult medical examination without abnormal findings: Secondary | ICD-10-CM

## 2016-10-14 DIAGNOSIS — M1990 Unspecified osteoarthritis, unspecified site: Secondary | ICD-10-CM | POA: Insufficient documentation

## 2016-10-14 DIAGNOSIS — M159 Polyosteoarthritis, unspecified: Secondary | ICD-10-CM

## 2016-10-14 LAB — CBC WITH DIFFERENTIAL/PLATELET
Basophils Absolute: 0 10*3/uL (ref 0.0–0.1)
Basophils Relative: 0.3 % (ref 0.0–3.0)
EOS ABS: 0.2 10*3/uL (ref 0.0–0.7)
EOS PCT: 3.2 % (ref 0.0–5.0)
HCT: 43 % (ref 39.0–52.0)
HEMOGLOBIN: 14.4 g/dL (ref 13.0–17.0)
LYMPHS ABS: 2.2 10*3/uL (ref 0.7–4.0)
Lymphocytes Relative: 31.5 % (ref 12.0–46.0)
MCHC: 33.6 g/dL (ref 30.0–36.0)
MCV: 90.5 fl (ref 78.0–100.0)
MONO ABS: 0.6 10*3/uL (ref 0.1–1.0)
Monocytes Relative: 8.6 % (ref 3.0–12.0)
NEUTROS PCT: 56.4 % (ref 43.0–77.0)
Neutro Abs: 3.9 10*3/uL (ref 1.4–7.7)
Platelets: 175 10*3/uL (ref 150.0–400.0)
RBC: 4.75 Mil/uL (ref 4.22–5.81)
RDW: 12.9 % (ref 11.5–15.5)
WBC: 6.9 10*3/uL (ref 4.0–10.5)

## 2016-10-14 LAB — BASIC METABOLIC PANEL
BUN: 15 mg/dL (ref 6–23)
CHLORIDE: 103 meq/L (ref 96–112)
CO2: 30 meq/L (ref 19–32)
Calcium: 9.4 mg/dL (ref 8.4–10.5)
Creatinine, Ser: 0.82 mg/dL (ref 0.40–1.50)
GFR: 97.43 mL/min (ref >60.00)
GLUCOSE: 104 mg/dL — AB (ref 70–99)
POTASSIUM: 4.3 meq/L (ref 3.5–5.1)
SODIUM: 138 meq/L (ref 135–145)

## 2016-10-14 LAB — LIPID PANEL
CHOL/HDL RATIO: 3
Cholesterol: 148 mg/dL (ref 0–200)
HDL: 52.2 mg/dL (ref >39.00)
LDL CALC: 74 mg/dL (ref 0–99)
NONHDL: 96.16
Triglycerides: 112 mg/dL (ref 0.0–149.0)
VLDL: 22.4 mg/dL (ref 0.0–40.0)

## 2016-10-14 LAB — TSH: TSH: 3.6 u[IU]/mL (ref 0.35–4.50)

## 2016-10-14 MED ORDER — DULOXETINE HCL 30 MG PO CPEP
30.0000 mg | ORAL_CAPSULE | Freq: Every day | ORAL | 1 refills | Status: DC
Start: 2016-10-14 — End: 2016-11-25

## 2016-10-14 NOTE — Progress Notes (Signed)
Subjective:    Patient ID: Devin [name], male    DOB: 1941-07-16, 76 y.o.   MRN: [id number]  DOS:  10/14/2016 Type of visit - description : cpx Interval history: Multiple other issues were discussed   Review of Systems  Constitutional: No fever. No chills. No unexplained wt changes. No unusual sweats  HEENT: No dental problems, no ear discharge, no facial swelling, no voice changes. No eye discharge, no eye  redness , no  intolerance to light   Respiratory: No wheezing , no  difficulty breathing. No cough , no mucus production  Cardiovascular: No CP, occasionally has bilateral leg swelling , no  Palpitations  GI: no nausea, no vomiting, no diarrhea , no  abdominal pain.  No blood in the stools. No dysphagia, no odynophagia    Endocrine: No polyphagia, no polyuria , no polydipsia  GU: No dysuria, gross hematuria, difficulty urinating. No urinary urgency, no frequency.  Musculoskeletal: Multiple joints hurt without swelling or redness. Particularly left shoulder and left hip.  Skin: No change in the color of the skin, palor , no  Rash  Allergic, immunologic: No environmental allergies , no  food allergies  Neurological: No dizziness no  syncope. No headaches. No diplopia, no slurred, no slurred speech, no motor deficits, no facial  Numbness  Hematological: No enlarged lymph nodes, no easy bruising , no unusual bleedings  Psychiatry: No suicidal ideas, no hallucinations, no beavior problems, no confusion.  Ongoing depression, sadness. No suicidal ideas Past Medical History:  Diagnosis Date  . COPD (chronic obstructive pulmonary disease) ([place])   . Diverticulosis   . H/O [facility] spotted fever 01/27/14  . Hematuria   . Hernia of abdominal cavity   . History of rectal polyps   . Hyperplastic colon polyp   . Hypertension   . Macular degeneration   . Rectal cancer ([place])   . Tubular adenoma    01/05/2013    Past Surgical History:  Procedure Laterality Date  .  APPENDECTOMY    . COLON SURGERY     rectal cancer ~ 1999  . EYE SURGERY     cataract sx  . HERNIA REPAIR    . mastoid gland removal     left ear  . PROSTATE SURGERY  05/2015  . TONSILLECTOMY      Social History   Social History  . Marital status: Married    Spouse name: Denice Paradise  . Number of children: 2  . Years of education: N/A   Occupational History  . RetiredSocial research officer, government    Social History Main Topics  . Smoking status: Former Smoker    Quit date: 04/19/1989  . Smokeless tobacco: Never Used  . Alcohol use 8.4 oz/week    14 Glasses of wine per week     Comment: most days  , amounts varies , sometimes > 2 servings   . Drug use: No  . Sexual activity: Not on file   Other Topics Concern  . Not on file   Social History Narrative   Household-- pt, wife (she has a post polio syndrome)   Lost oldest son ~ 2011   Moved from [place] ~ 2011   Youngest son lives in [place] reason why he moved here       Family History  Problem Relation Age of Onset  . Breast cancer Mother   . Colon cancer Mother     Colon cancer  . Aneurysm Father   . Heart attack Father  31  . Aneurysm Son   . Stroke Paternal Grandfather   . Cancer Paternal Grandmother   . Cancer Maternal Grandmother   . Prostate cancer Neg Hx      Allergies as of 10/14/2016   No Known Allergies     Medication List       Accurate as of 10/14/16 10:36 AM. Always use your most recent med list.          aspirin 81 MG tablet Take 1 tablet (81 mg total) by mouth daily.   BILBERRY FRUIT PO Take 1 tablet by mouth.   calcium carbonate 600 MG Tabs tablet Commonly known as:  OS-CAL Take 1 tablet (600 mg total) by mouth daily with breakfast. + vitamin D   clotrimazole-betamethasone cream Commonly known as:  LOTRISONE Apply 1 application topically 2 (two) times daily.   doxycycline 100 MG tablet Commonly known as:  VIBRA-TABS Take 1 tablet (100 mg total) by mouth 2 (two) times daily.   fluticasone 50  MCG/ACT nasal spray Commonly known as:  FLONASE Place 2 sprays into both nostrils daily.   lisinopril 10 MG tablet Commonly known as:  PRINIVIL,ZESTRIL Take 1 tablet (10 mg total) by mouth daily.   meloxicam 7.5 MG tablet Commonly known as:  MOBIC Take 2 tablets (15 mg total) by mouth daily. Take one tablet once or twice daily as needed for joint pain.   multivitamin-lutein Caps capsule Take 1 capsule by mouth daily.   nortriptyline 50 MG capsule Commonly known as:  PAMELOR Take 1 capsule (50 mg total) by mouth at bedtime.   omeprazole 20 MG capsule Commonly known as:  PRILOSEC Take 1 capsule (20 mg total) by mouth daily.          Objective:   Physical Exam BP 124/78 (BP Location: Left Arm, Patient Position: Sitting, Cuff Size: Normal)   Pulse 66   Temp 97.7 F (36.5 C) (Oral)   Resp 14   Ht 6\' 2"  (1.88 m)   Wt 226 lb 6 oz (102.7 kg)   SpO2 96%   BMI 29.06 kg/m   General:   Well developed, well nourished . NAD.  Neck: No  thyromegaly  HEENT:  Normocephalic . Face symmetric, atraumatic Lungs:  CTA B Normal respiratory effort, no intercostal retractions, no accessory muscle use. Heart: RRR,  no murmur.  No pretibial edema bilaterally  Abdomen:  Not distended, soft, non-tender. No rebound or rigidity.   Skin: Exposed areas without rash. Not pale. Not jaundice Neurologic:  alert & oriented X3.  Speech normal, gait appropriate for age and unassisted Strength symmetric and appropriate for age.  Psych: Cognition and judgment appear intact.  Cooperative with normal attention span and concentration.  Behavior appropriate. Tearful at times     Assessment & Plan:   Assessment HTN Depression-insomnia, lost a son ~2011 DJD- hip injection 2017  BPH --s/p surgery x2 2016 , Dr Hazle Nordmann H/o etoh, mild  Rectal cancer-- s/p surgery ~ 1999, no chemo-XRT FH CAD, F MI age 83 Former light smoker , No symptoms  PLAN HTN: Well-controlled on lisinopril, checking  labs Depression and insomnia: Insomnia is not an issue, continued to feel depressed and sad, no suicidal ideas.  Multiple triggers including her her wife's health, he is not as close w/ his son as he thought he would be. He is counseled extensively today, previously on fluoxetine which help but make him feel flat. I recommend formal counseling and medication. He agreed to try Cymbalta 30 mg.  suicidalty discussed . RTC 6 weeks DJD: Had a hip injection last year pain is better. For multiple joints aches and pains takes meloxicam daily. Recommend to limit its use, try Tylenol.  RTC 6 months  Today, in addition to the physical exam, I spent more than  20  min with the patient: >50% of the time counseling regards  depression, discussing pros and cons of medication and formal counseling, performing listening therapy and providing counseling myself.

## 2016-10-14 NOTE — Assessment & Plan Note (Signed)
HTN: Well-controlled on lisinopril, checking labs Depression and insomnia: Insomnia is not an issue, continued to feel depressed and sad, no suicidal ideas.  Multiple triggers including her her wife's health, he is not as close w/ his son as he thought he would be. He is counseled extensively today, previously on fluoxetine which help but make him feel flat. I recommend formal counseling and medication. He agreed to try Cymbalta 30 mg. suicidalty discussed . RTC 6 weeks DJD: Had a hip injection last year pain is better. For multiple joints aches and pains takes meloxicam daily. Recommend to limit its use, try Tylenol.  RTC 6 months

## 2016-10-14 NOTE — Assessment & Plan Note (Addendum)
Td 2008, pnm 23- 2009, pnm 13- 2015, zostavax 2009 Had a flu shot CCS: Cscope 04-2014, + polyps, 3 years Dr Deatra Ina Prostate ca screening -- sees urology, no records  Diet and exercise discussed Labs: BMP, FLP, CBC, TSH, vitamin D

## 2016-10-14 NOTE — Patient Instructions (Signed)
GO TO THE LAB : Get the blood work     GO TO THE FRONT DESK Schedule your next appointment for a  checkup in 6 weeks   Take Cymbalta 30 mg one tablet daily  See a counselor  For arthritis: Take Tylenol 500 mg 2 tablets twice a day as needed Okay to take meloxicam, try not to take daily, watch for side effects such as stomach pain, nausea, change in the color of the stools

## 2016-10-14 NOTE — Progress Notes (Signed)
Pre visit review using our clinic review tool, if applicable. No additional management support is needed unless otherwise documented below in the visit note. 

## 2016-10-14 NOTE — Assessment & Plan Note (Signed)
Bronchitis: Sx c/w bronchitis, in no distress. Will treat with doxycycline and supportive therapy. Due for a CPX, encouraged to schedule a visit.

## 2016-10-15 ENCOUNTER — Ambulatory Visit: Payer: Self-pay | Admitting: Internal Medicine

## 2016-10-18 LAB — VITAMIN D 1,25 DIHYDROXY
Vitamin D 1, 25 (OH)2 Total: 61 pg/mL (ref 18–72)
Vitamin D2 1, 25 (OH)2: <8 pg/mL
Vitamin D3 1, 25 (OH)2: 61 pg/mL

## 2016-10-29 DIAGNOSIS — H353211 Exudative age-related macular degeneration, right eye, with active choroidal neovascularization: Secondary | ICD-10-CM | POA: Diagnosis not present

## 2016-10-31 DIAGNOSIS — H353122 Nonexudative age-related macular degeneration, left eye, intermediate dry stage: Secondary | ICD-10-CM | POA: Diagnosis not present

## 2016-10-31 DIAGNOSIS — Z961 Presence of intraocular lens: Secondary | ICD-10-CM | POA: Diagnosis not present

## 2016-10-31 DIAGNOSIS — H26493 Other secondary cataract, bilateral: Secondary | ICD-10-CM | POA: Diagnosis not present

## 2016-10-31 DIAGNOSIS — H353212 Exudative age-related macular degeneration, right eye, with inactive choroidal neovascularization: Secondary | ICD-10-CM | POA: Diagnosis not present

## 2016-11-01 ENCOUNTER — Other Ambulatory Visit: Payer: Self-pay | Admitting: Internal Medicine

## 2016-11-03 MED ORDER — LISINOPRIL 10 MG PO TABS: 10.0000 mg | ORAL_TABLET | Freq: Every day | ORAL | 2 refills | Status: DC

## 2016-11-10 DIAGNOSIS — H26491 Other secondary cataract, right eye: Secondary | ICD-10-CM | POA: Diagnosis not present

## 2016-11-17 DIAGNOSIS — H353211 Exudative age-related macular degeneration, right eye, with active choroidal neovascularization: Secondary | ICD-10-CM | POA: Diagnosis not present

## 2016-11-24 DIAGNOSIS — H26492 Other secondary cataract, left eye: Secondary | ICD-10-CM | POA: Diagnosis not present

## 2016-11-25 ENCOUNTER — Encounter: Payer: Self-pay | Admitting: Internal Medicine

## 2016-11-25 ENCOUNTER — Ambulatory Visit (INDEPENDENT_AMBULATORY_CARE_PROVIDER_SITE_OTHER): Payer: Medicare HMO | Admitting: Internal Medicine

## 2016-11-25 VITALS — BP 120/72 | HR 81 | Temp 98.2°F | Resp 14 | Ht 74.0 in | Wt 227.1 lb

## 2016-11-25 DIAGNOSIS — F329 Major depressive disorder, single episode, unspecified: Secondary | ICD-10-CM

## 2016-11-25 DIAGNOSIS — F32A Depression, unspecified: Secondary | ICD-10-CM

## 2016-11-25 DIAGNOSIS — M150 Primary generalized (osteo)arthritis: Secondary | ICD-10-CM

## 2016-11-25 DIAGNOSIS — M159 Polyosteoarthritis, unspecified: Secondary | ICD-10-CM

## 2016-11-25 DIAGNOSIS — S20212A Contusion of left front wall of thorax, initial encounter: Secondary | ICD-10-CM

## 2016-11-25 DIAGNOSIS — K219 Gastro-esophageal reflux disease without esophagitis: Secondary | ICD-10-CM | POA: Diagnosis not present

## 2016-11-25 NOTE — Assessment & Plan Note (Signed)
Depression, insomnia: Tried Cymbalta for 4 weeks, no s/e but reports that he didn't feel it was helping. He has done a lot of thinking about his depression and his  life, plans to make some changes, at this point does not like to retry Cymbalta or any other medication. (Explained that there are other options and medications may take 6 weeks to work). He is not interested on counseling either but I asked him to keep that as an option. GERD: Has breakthrough symptoms at night, recommend to change the timing of PPIs to before meals and let me know if not improving Chest wall contusion: States he is doing okay, does not like x-rays done. Will continue Tylenol DJD: At the last visit was recommended Tylenol as a way to decrease the use of meloxicam, is working well for him RTC 6 months

## 2016-11-25 NOTE — Patient Instructions (Addendum)
   GO TO THE FRONT DESK Schedule your next appointment for a  Check up in 6 months  

## 2016-11-25 NOTE — Progress Notes (Signed)
Pre visit review using our clinic review tool, if applicable. No additional management support is needed unless otherwise documented below in the visit note. 

## 2016-11-25 NOTE — Progress Notes (Signed)
Subjective:    Patient ID: Devin [name], male    DOB: November 22, 1940, 76 y.o.   MRN: [id number]  DOS:  11/25/2016 Type of visit - description : Follow-up Interval history: Since  last visit, he try Cymbalta for 4 weeks, no side effects, he felt that it was not helping at all. At the same time, he has been doing a lot of thinking about his depression and does not like to go back on medication,  thinks he now can handle it  Also  history of GERD, taking omeprazole after breakfast, having breakthrough symptoms at night. Also, had an accident in his yard, landed on the left side, having pain at the chest wall with deep breathing or coughing. No neck or head injury  Review of Systems Denies suicidal ideas No difficulty breathing No dysphagia or odynophagia Past Medical History:  Diagnosis Date  . COPD (chronic obstructive pulmonary disease) ([place])   . Diverticulosis   . H/O [facility] spotted fever 01/27/14  . Hematuria   . Hernia of abdominal cavity   . History of rectal polyps   . Hyperplastic colon polyp   . Hypertension   . Macular degeneration   . Rectal cancer ([place])   . Tubular adenoma    01/05/2013    Past Surgical History:  Procedure Laterality Date  . APPENDECTOMY    . COLON SURGERY     rectal cancer ~ 1999  . EYE SURGERY     cataract sx  . HERNIA REPAIR    . mastoid gland removal     left ear  . PROSTATE SURGERY  05/2015  . TONSILLECTOMY      Social History   Social History  . Marital status: Married    Spouse name: Denice Paradise  . Number of children: 2  . Years of education: N/A   Occupational History  . RetiredSocial research officer, government    Social History Main Topics  . Smoking status: Former Smoker    Quit date: 04/19/1989  . Smokeless tobacco: Never Used  . Alcohol use 8.4 oz/week    14 Glasses of wine per week     Comment: most days  , amounts varies , sometimes > 2 servings   . Drug use: No  . Sexual activity: Not on file   Other Topics Concern  . Not on file    Social History Narrative   Household-- pt, wife (she has a post polio syndrome)   Lost oldest son ~ 2011   Moved from [place] ~ 2011   Youngest son lives in [place] reason why he moved here        Allergies as of 11/25/2016   No Known Allergies     Medication List       Accurate as of 11/25/16  6:18 PM. Always use your most recent med list.          aspirin 81 MG tablet Take 1 tablet (81 mg total) by mouth daily.   BILBERRY FRUIT PO Take 1 tablet by mouth.   calcium carbonate 600 MG Tabs tablet Commonly known as:  OS-CAL Take 1 tablet (600 mg total) by mouth daily with breakfast. + vitamin D   clotrimazole-betamethasone cream Commonly known as:  LOTRISONE Apply 1 application topically 2 (two) times daily.   fluticasone 50 MCG/ACT nasal spray Commonly known as:  FLONASE Place 2 sprays into both nostrils daily.   lisinopril 10 MG tablet Commonly known as:  PRINIVIL,ZESTRIL Take 1 tablet (10 mg  total) by mouth daily.   meloxicam 7.5 MG tablet Commonly known as:  MOBIC Take 2 tablets (15 mg total) by mouth daily. Take one tablet once or twice daily as needed for joint pain.   multivitamin-lutein Caps capsule Take 1 capsule by mouth daily.   nortriptyline 50 MG capsule Commonly known as:  PAMELOR Take 1 capsule (50 mg total) by mouth at bedtime.   omeprazole 20 MG capsule Commonly known as:  PRILOSEC Take 1 capsule (20 mg total) by mouth daily.          Objective:   Physical Exam BP 120/72 (BP Location: Left Arm, Patient Position: Sitting, Cuff Size: Normal)   Pulse 81   Temp 98.2 F (36.8 C) (Oral)   Resp 14   Ht 6\' 2"  (1.88 m)   Wt 227 lb 2 oz (103 kg)   SpO2 94%   BMI 29.16 kg/m  General:   Well developed, well nourished . NAD.  HEENT:  Normocephalic . Face symmetric, atraumatic Lungs:  CTA B Normal respiratory effort, no intercostal retractions, no accessory muscle use. Heart: RRR,  no murmur.  No pretibial edema bilaterally  Chest  wall: Early TTP at the left lateral side. No deformities or crepitus Skin: Not pale. Not jaundice Neurologic:  alert & oriented X3.  Speech normal, gait appropriate for age and unassisted Psych--  Cognition and judgment appear intact.  Cooperative with normal attention span and concentration.  Behavior appropriate. Seems better emotionally     Assessment & Plan:    Assessment HTN Depression-insomnia, lost a son ~2011 GERD sx onset ~2016 DJD- hip injection 2017  BPH --s/p surgery x2 2016 , Dr Hazle Nordmann H/o etoh, mild  Rectal cancer-- s/p surgery ~ 1999, no chemo-XRT FH CAD, F MI age 28 Former light smoker , No symptoms  PLAN Depression, insomnia: Tried Cymbalta for 4 weeks, no s/e but reports that he didn't feel it was helping. He has done a lot of thinking about his depression and his  life, plans to make some changes, at this point does not like to retry Cymbalta or any other medication. (Explained that there are other options and medications may take 6 weeks to work). He is not interested on counseling either but I asked him to keep that as an option. GERD: Has breakthrough symptoms at night, recommend to change the timing of PPIs to before meals and let me know if not improving Chest wall contusion: States he is doing okay, does not like x-rays done. Will continue Tylenol DJD: At the last visit was recommended Tylenol as a way to decrease the use of meloxicam, is working well for him RTC 6 months   Today, I spent more than 25  min with the patient: >50% of the time counseling regards counseling regards depression, insomnia, listening to his concerns.

## 2016-11-27 ENCOUNTER — Telehealth: Payer: Self-pay | Admitting: Internal Medicine

## 2016-11-27 NOTE — Telephone Encounter (Signed)
Refills sent on 11/03/2016 #90 and 2RF to [facility] mail order.

## 2016-11-27 NOTE — Telephone Encounter (Signed)
Self.    Refill request for Lisinopril    Pharmacy: [facility] - [place], [place]

## 2016-12-09 ENCOUNTER — Other Ambulatory Visit: Payer: Self-pay | Admitting: Internal Medicine

## 2017-01-12 DIAGNOSIS — H353211 Exudative age-related macular degeneration, right eye, with active choroidal neovascularization: Secondary | ICD-10-CM | POA: Diagnosis not present

## 2017-01-27 DIAGNOSIS — N138 Other obstructive and reflux uropathy: Secondary | ICD-10-CM | POA: Diagnosis not present

## 2017-01-27 DIAGNOSIS — N401 Enlarged prostate with lower urinary tract symptoms: Secondary | ICD-10-CM | POA: Diagnosis not present

## 2017-01-27 DIAGNOSIS — R358 Other polyuria: Secondary | ICD-10-CM | POA: Diagnosis not present

## 2017-03-11 DIAGNOSIS — H353211 Exudative age-related macular degeneration, right eye, with active choroidal neovascularization: Secondary | ICD-10-CM | POA: Diagnosis not present

## 2017-03-19 ENCOUNTER — Encounter: Payer: Self-pay | Admitting: Gastroenterology

## 2017-03-23 ENCOUNTER — Ambulatory Visit (INDEPENDENT_AMBULATORY_CARE_PROVIDER_SITE_OTHER): Payer: Medicare HMO | Admitting: Family Medicine

## 2017-03-23 ENCOUNTER — Encounter: Payer: Self-pay | Admitting: Family Medicine

## 2017-03-23 VITALS — BP 110/68 | HR 86 | Temp 97.8°F | Ht 74.0 in | Wt 215.0 lb

## 2017-03-23 DIAGNOSIS — G4700 Insomnia, unspecified: Secondary | ICD-10-CM | POA: Diagnosis not present

## 2017-03-23 DIAGNOSIS — M7062 Trochanteric bursitis, left hip: Secondary | ICD-10-CM

## 2017-03-23 DIAGNOSIS — G8929 Other chronic pain: Secondary | ICD-10-CM

## 2017-03-23 DIAGNOSIS — M25512 Pain in left shoulder: Secondary | ICD-10-CM | POA: Diagnosis not present

## 2017-03-23 MED ORDER — TRIAMCINOLONE ACETONIDE 40 MG/ML IJ SUSP
40.0000 mg | Freq: Once | INTRAMUSCULAR | Status: AC
  Administered 2017-03-23: 40 mg

## 2017-03-23 MED ORDER — NORTRIPTYLINE HCL 50 MG PO CAPS: 50.0000 mg | ORAL_CAPSULE | Freq: Every day | ORAL | 1 refills | Status: DC

## 2017-03-23 NOTE — Addendum Note (Signed)
Addended by: Harl Bowie on: 03/23/2017 12:32 PM   Modules accepted: Orders

## 2017-03-23 NOTE — Patient Instructions (Signed)
Call in 4 weeks if you are not improving. We will have to do physical therapy.  EXERCISES  RANGE OF MOTION (ROM) AND STRETCHING EXERCISES These exercises may help you when beginning to rehabilitate your injury. Your symptoms may resolve with or without further involvement from your physician, physical therapist or athletic trainer. While completing these exercises, remember:   Restoring tissue flexibility helps normal motion to return to the joints. This allows healthier, less painful movement and activity.  An effective stretch should be held for at least 30 seconds.  A stretch should never be painful. You should only feel a gentle lengthening or release in the stretched tissue.  ROM - Pendulum  Bend at the waist so that your right / left arm falls away from your body. Support yourself with your opposite hand on a solid surface, such as a table or a countertop.  Your right / left arm should be perpendicular to the ground. If it is not perpendicular, you need to lean over farther. Relax the muscles in your right / left arm and shoulder as much as possible.  Gently sway your hips and trunk so they move your right / left arm without any use of your right / left shoulder muscles.  Progress your movements so that your right / left arm moves side to side, then forward and backward, and finally, both clockwise and counterclockwise.  Complete 10-15 repetitions in each direction. Many people use this exercise to relieve discomfort in their shoulder as well as to gain range of motion. Repeat 2-3 times. Complete this exercise once per day.  STRETCH - Flexion, Standing  Stand with good posture. With an underhand grip on your right / left hand and an overhand grip on the opposite hand, grasp a broomstick or cane so that your hands are a little more than shoulder-width apart.  Keeping your right / left elbow straight and shoulder muscles relaxed, push the stick with your opposite hand to raise your  right / left arm in front of your body and then overhead. Raise your arm until you feel a stretch in your right / left shoulder, but before you have increased shoulder pain.  Try to avoid shrugging your right / left shoulder as your arm rises by keeping your shoulder blade tucked down and toward your mid-back spine. Hold 15-20 seconds.  Slowly return to the starting position. Repeat 2-3 times. Complete this exercise once per day.  STRETCH - Internal Rotation  Place your right / left hand behind your back, palm-up.  Throw a towel or belt over your opposite shoulder. Grasp the towel/belt with your right / left hand.  While keeping an upright posture, gently pull up on the towel/belt until you feel a stretch in the front of your right / left shoulder.  Avoid shrugging your right / left shoulder as your arm rises by keeping your shoulder blade tucked down and toward your mid-back spine.  Hold 15-20. Release the stretch by lowering your opposite hand. Repeat 2-3 times. Complete this exercise once per day.  STRETCH - External Rotation and Abduction  Stagger your stance through a doorframe. It does not matter which foot is forward.  As instructed by your physician, physical therapist or athletic trainer, place your hands: ? And forearms above your head and on the door frame. ? And forearms at head-height and on the door frame. ? At elbow-height and on the door frame.  Keeping your head and chest upright and your stomach muscles tight  to prevent over-extending your low-back, slowly shift your weight onto your front foot until you feel a stretch across your chest and/or in the front of your shoulders.  Hold 15-20 seconds. Shift your weight to your back foot to release the stretch. Repeat 2-3 times. Complete this stretch once per day.   STRENGTHENING EXERCISES  These exercises may help you when beginning to rehabilitate your injury. They may resolve your symptoms with or without further  involvement from your physician, physical therapist or athletic trainer. While completing these exercises, remember:   Muscles can gain both the endurance and the strength needed for everyday activities through controlled exercises.  Complete these exercises as instructed by your physician, physical therapist or athletic trainer. Progress the resistance and repetitions only as guided.  You may experience muscle soreness or fatigue, but the pain or discomfort you are trying to eliminate should never worsen during these exercises. If this pain does worsen, stop and make certain you are following the directions exactly. If the pain is still present after adjustments, discontinue the exercise until you can discuss the trouble with your clinician.  If advised by your physician, during your recovery, avoid activity or exercises which involve actions that place your right / left hand or elbow above your head or behind your back or head. These positions stress the tissues which are trying to heal.  STRENGTH - Scapular Depression and Adduction  With good posture, sit on a firm chair. Supported your arms in front of you with pillows, arm rests or a table top. Have your elbows in line with the sides of your body.  Gently draw your shoulder blades down and toward your mid-back spine. Gradually increase the tension without tensing the muscles along the top of your shoulders and the back of your neck.  Hold for 5 seconds. Slowly release the tension and relax your muscles completely before completing the next repetition.  After you have practiced this exercise, remove the arm support and complete it in standing as well as sitting. Repeat 2 times. Complete this exercise every other day.   STRENGTH - External Rotators  Secure a rubber exercise band/tubing to a fixed object so that it is at the same height as your right / left elbow when you are standing or sitting on a firm surface.  Stand or sit so that the  secured exercise band/tubing is at your side that is not injured.  Bend your elbow 90 degrees. Place a folded towel or small pillow under your right / left arm so that your elbow is a few inches away from your side.  Keeping the tension on the exercise band/tubing, pull it away from your body, as if pivoting on your elbow. Be sure to keep your body steady so that the movement is only coming from your shoulder rotating.  Hold 5 seconds. Release the tension in a controlled manner as you return to the starting position. Repeat 2 times. Complete this exercise every other day.   STRENGTH - Supraspinatus  Stand or sit with good posture. Grasp a 2-3 lb weight or an exercise band/tubing so that your hand is "thumbs-up," like when you shake hands.  Slowly lift your right / left hand from your thigh into the air, traveling about 30 degrees from straight out at your side. Lift your hand to shoulder height or as far as you can without increasing any shoulder pain. Initially, many people do not lift their hands above shoulder height.  Avoid shrugging your  right / left shoulder as your arm rises by keeping your shoulder blade tucked down and toward your mid-back spine.  Hold for 4-5 seconds. Control the descent of your hand as you slowly return to your starting position. Repeat 2 times. Complete this exercise every other day.   STRENGTH - Shoulder Extensors  Secure a rubber exercise band/tubing so that it is at the height of your shoulders when you are either standing or sitting on a firm arm-less chair.  With a thumbs-up grip, grasp an end of the band/tubing in each hand. Straighten your elbows and lift your hands straight in front of you at shoulder height. Step back away from the secured end of band/tubing until it becomes tense.  Squeezing your shoulder blades together, pull your hands down to the sides of your thighs. Do not allow your hands to go behind you.  Hold for 5 seconds. Slowly ease the  tension on the band/tubing as you reverse the directions and return to the starting position. Repeat 2 times. Complete this exercise every other day.   STRENGTH - Scapular Retractors  Secure a rubber exercise band/tubing so that it is at the height of your shoulders when you are either standing or sitting on a firm arm-less chair.  With a palm-down grip, grasp an end of the band/tubing in each hand. Straighten your elbows and lift your hands straight in front of you at shoulder height. Step back away from the secured end of band/tubing until it becomes tense.  Squeezing your shoulder blades together, draw your elbows back as you bend them. Keep your upper arm lifted away from your body throughout the exercise.  Hold 5 seconds. Slowly ease the tension on the band/tubing as you reverse the directions and return to the starting position. Repeat 2 times. Complete this exercise every other day.  STRENGTH - Scapular Depressors  Find a sturdy chair without wheels, such as a from a dining room table.  Keeping your feet on the floor, lift your bottom from the seat and lock your elbows.  Keeping your elbows straight, allow gravity to pull your body weight down. Your shoulders will rise toward your ears.  Raise your body against gravity by drawing your shoulder blades down your back, shortening the distance between your shoulders and ears. Although your feet should always maintain contact with the floor, your feet should progressively support less body weight as you get stronger.  Hold 5 seconds. In a controlled and slow manner, lower your body weight to begin the next repetition. Repeat 2 times. Complete this exercise every other day.   This information is not intended to replace advice given to you by your health care provider. Make sure you discuss any questions you have with your health care provider.  Document Released: 07/30/2005 Document Revised: 10/06/2014 Document Reviewed:  12/28/2008 Elsevier Interactive Patient Education [employer].
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
## 2017-03-26 ENCOUNTER — Telehealth: Payer: Self-pay

## 2017-03-26 NOTE — Telephone Encounter (Signed)
PA initiated via Covermymeds; KEY: [id number]. Received real-time PA approval.   Questionnaire submitted. PA Case [id number] Status: Approved. Authorization and Notifications Pending

## 2017-03-27 ENCOUNTER — Encounter: Payer: Self-pay | Admitting: Internal Medicine

## 2017-04-17 ENCOUNTER — Encounter: Payer: Self-pay | Admitting: Internal Medicine

## 2017-04-17 ENCOUNTER — Ambulatory Visit (INDEPENDENT_AMBULATORY_CARE_PROVIDER_SITE_OTHER): Payer: Medicare HMO | Admitting: Internal Medicine

## 2017-04-17 VITALS — BP 102/62 | HR 86 | Temp 97.7°F | Resp 14 | Ht 74.0 in | Wt 212.1 lb

## 2017-04-17 DIAGNOSIS — I10 Essential (primary) hypertension: Secondary | ICD-10-CM

## 2017-04-17 DIAGNOSIS — L089 Local infection of the skin and subcutaneous tissue, unspecified: Secondary | ICD-10-CM | POA: Diagnosis not present

## 2017-04-17 MED ORDER — CEPHALEXIN 500 MG PO CAPS: 500.0000 mg | ORAL_CAPSULE | Freq: Four times a day (QID) | ORAL | 0 refills | Status: DC

## 2017-04-17 NOTE — Patient Instructions (Signed)
Take the antibiotic called Keflex for 5 days  Place the finger in warm water twice a day  Call if not improving, call if it gets worse  Decrease lisinopril to half tablet a day.  Check the  blood pressure 2 or 3 times a  week Be sure your blood pressure is between 110/65 and  140/85.  if it is consistently higher or lower, let me know

## 2017-04-17 NOTE — Progress Notes (Signed)
Subjective:    Patient ID: Devin [name], male    DOB: [birth year], 76 y.o.   MRN: [id number]  DOS:  04/17/2017 Type of visit - description : acute Interval history: One-week history of swelling and pain at the medial left finger. Has not seen any discharge. He works in his yard a lot during the summer but denies any cut or injury.  Also, lost 15 pounds in the last 6 months by eating healthier and stay more active. BP is noted to be low today, not ambulatory BPs. He admits to feel dizzy most mornings when he gets up. Symptoms quickly pass.  Wt Readings from Last 3 Encounters:  04/17/17 212 lb 2 oz (96.2 kg)  03/23/17 215 lb (97.5 kg)  11/25/16 227 lb 2 oz (103 kg)     Review of Systems Denies any fever chills No blisters in the finger  Past Medical History:  Diagnosis Date  . COPD (chronic obstructive pulmonary disease) ([place])   . Diverticulosis   . H/O [facility] spotted fever 01/27/14  . Hematuria   . Hernia of abdominal cavity   . History of rectal polyps   . Hyperplastic colon polyp   . Hypertension   . Macular degeneration   . Rectal cancer ([place])   . Tubular adenoma    01/05/2013    Past Surgical History:  Procedure Laterality Date  . APPENDECTOMY    . COLON SURGERY     rectal cancer ~ 1999  . EYE SURGERY     cataract sx  . HERNIA REPAIR    . mastoid gland removal     left ear  . PROSTATE SURGERY  05/2015  . TONSILLECTOMY      Social History   Social History  . Marital status: Married    Spouse name: Denice Paradise  . Number of children: 2  . Years of education: N/A   Occupational History  . RetiredSocial research officer, government    Social History Main Topics  . Smoking status: Former Smoker    Quit date: 04/19/1989  . Smokeless tobacco: Never Used  . Alcohol use 8.4 oz/week    14 Glasses of wine per week     Comment: most days  , amounts varies , sometimes > 2 servings   . Drug use: No  . Sexual activity: Not on file   Other Topics Concern  . Not on file   Social  History Narrative   Household-- pt, wife (she has a post polio syndrome)   Lost oldest son ~ 2011   Moved from [place] ~ 2011   Youngest son lives in [place] reason why he moved here        Allergies as of 04/17/2017   No Known Allergies     Medication List       Accurate as of 04/17/17  5:38 PM. Always use your most recent med list.          aspirin 81 MG tablet Take 1 tablet (81 mg total) by mouth daily.   calcium carbonate 600 MG Tabs tablet Commonly known as:  OS-CAL Take 1 tablet (600 mg total) by mouth daily with breakfast. + vitamin D   cephALEXin 500 MG capsule Commonly known as:  KEFLEX Take 1 capsule (500 mg total) by mouth 4 (four) times daily.   clotrimazole-betamethasone cream Commonly known as:  LOTRISONE Apply 1 application topically 2 (two) times daily.   fluticasone 50 MCG/ACT nasal spray Commonly known as:  Hoffman  2 sprays into both nostrils daily.   lisinopril 10 MG tablet Commonly known as:  PRINIVIL,ZESTRIL Take 5 mg by mouth daily.   nortriptyline 50 MG capsule Commonly known as:  PAMELOR Take 1 capsule (50 mg total) by mouth at bedtime.   omeprazole 20 MG capsule Commonly known as:  PRILOSEC Take 1 capsule (20 mg total) by mouth daily.   OVER THE COUNTER MEDICATION Arthritis Pain Reliever Acetaminophen 650mg -Take 2 tablets by mouth daily as needed.   SYSTANE OP Apply to eye. Instill 1 drop in each eye as needed.          Objective:   Physical Exam BP 102/62 (BP Location: Left Arm, Patient Position: Sitting, Cuff Size: Small)   Pulse 86   Temp 97.7 F (36.5 C) (Oral)   Resp 14   Ht 6\' 2"  (1.88 m)   Wt 212 lb 2 oz (96.2 kg)   SpO2 95%   BMI 27.24 kg/m  General:   Well developed, well nourished . NAD.  HEENT:  Normocephalic . Face symmetric, atraumatic Skin: See picture. I was unable to get any discharge from the area. Neurologic:  alert & oriented X3.  Speech normal, gait appropriate for age and  unassisted Psych--  Cognition and judgment appear intact.  Cooperative with normal attention span and concentration.  Behavior appropriate. No anxious or depressed appearing.        Assessment & Plan:    Assessment HTN Depression-insomnia, lost a son ~2011 GERD sx onset ~2016 DJD- hip injection 2017  BPH --s/p surgery x2 2016 , Dr Hazle Nordmann H/o etoh, mild  Rectal cancer-- s/p surgery ~ 1999, no chemo-XRT FH CAD, F MI age 32 Former light smoker , No symptoms  PLAN Infection, middle finger: Likely bacterial infection, I did an see or feel any FB, no blisters. Will treat with Keflex, see instructions. HTN: Has lost a significant amount of weight by eating healthier and stay more active. Decrease lisinopril to 5 mg daily. See instructions. Follow-up already scheduled for next month

## 2017-04-17 NOTE — Progress Notes (Signed)
Pre visit review using our clinic review tool, if applicable. No additional management support is needed unless otherwise documented below in the visit note. 

## 2017-04-17 NOTE — Assessment & Plan Note (Signed)
Infection, middle finger: Likely bacterial infection, I did an see or feel any FB, no blisters. Will treat with Keflex, see instructions. HTN: Has lost a significant amount of weight by eating healthier and stay more active. Decrease lisinopril to 5 mg daily. See instructions. Follow-up already scheduled for next month

## 2017-05-05 DIAGNOSIS — H353211 Exudative age-related macular degeneration, right eye, with active choroidal neovascularization: Secondary | ICD-10-CM | POA: Diagnosis not present

## 2017-05-06 ENCOUNTER — Ambulatory Visit (AMBULATORY_SURGERY_CENTER): Payer: Self-pay

## 2017-05-06 VITALS — Ht 74.0 in | Wt 215.0 lb

## 2017-05-06 DIAGNOSIS — Z800 Family history of malignant neoplasm of digestive organs: Secondary | ICD-10-CM

## 2017-05-06 DIAGNOSIS — Z85048 Personal history of other malignant neoplasm of rectum, rectosigmoid junction, and anus: Secondary | ICD-10-CM

## 2017-05-06 MED ORDER — NA SULFATE-K SULFATE-MG SULF 17.5-3.13-1.6 GM/177ML PO SOLN: ORAL | 0 refills | Status: DC

## 2017-05-06 NOTE — Progress Notes (Signed)
Per pt, no allergies to soy or egg products.Pt not taking any weight loss meds or using  O2 at home.   Pt refused Emmi video. 

## 2017-05-07 ENCOUNTER — Encounter: Payer: Self-pay | Admitting: Gastroenterology

## 2017-05-11 ENCOUNTER — Ambulatory Visit (INDEPENDENT_AMBULATORY_CARE_PROVIDER_SITE_OTHER): Payer: Medicare HMO | Admitting: Internal Medicine

## 2017-05-11 ENCOUNTER — Encounter: Payer: Self-pay | Admitting: Internal Medicine

## 2017-05-11 VITALS — BP 118/74 | HR 79 | Temp 98.0°F | Resp 12 | Ht 74.0 in | Wt 217.4 lb

## 2017-05-11 DIAGNOSIS — M25512 Pain in left shoulder: Secondary | ICD-10-CM

## 2017-05-11 DIAGNOSIS — G4700 Insomnia, unspecified: Secondary | ICD-10-CM | POA: Diagnosis not present

## 2017-05-11 DIAGNOSIS — F329 Major depressive disorder, single episode, unspecified: Secondary | ICD-10-CM

## 2017-05-11 DIAGNOSIS — I10 Essential (primary) hypertension: Secondary | ICD-10-CM | POA: Diagnosis not present

## 2017-05-11 DIAGNOSIS — F32A Depression, unspecified: Secondary | ICD-10-CM

## 2017-05-11 NOTE — Progress Notes (Signed)
Pre visit review using our clinic review tool, if applicable. No additional management support is needed unless otherwise documented below in the visit note. 

## 2017-05-11 NOTE — Assessment & Plan Note (Signed)
HTN: @ last OV, was doing very well with lifestyle, lisinopril dose decreased, he couldn't cut the tablet in half so he stopped taking lisinopril. BP today is very good, continue without meds, check ambulatory BPs. See instructions Depression and insomnia: Currently doing well, on nortriptyline prn MSK: Left shoulder pain exacerbated after a recent fall, range of motion decrease, encouraged to get treatment, plans to see Dr. Nani Ravens for possible local injection in the next few weeks. Finger infection: see last OV, improving RTC, CPX 09-2016

## 2017-05-11 NOTE — Progress Notes (Signed)
Subjective:    Patient ID: Devin [name], male    DOB: [birth year], 76 y.o.   MRN: [id number]  DOS:  05/11/2017 Type of visit - description : f/u Interval history: HTN: at the last visit lisinopril dose decreased to 5 mg however he couldn't cut the pills in half so he is not taking lisinopril. No ambulatory BPs. Finger infection: Better Depression: Symptoms are currently controlled Insomnia: Takes medications as needed only  Wt Readings from Last 3 Encounters:  05/11/17 217 lb 6 oz (98.6 kg)  05/06/17 215 lb (97.5 kg)  04/17/17 212 lb 2 oz (96.2 kg)    Review of Systems Denies chest pain or difficulty breathing. No palpitations or lower extremity edema [place] a couple weeks ago, has ongoing problems w/the left shoulder, pain got slightly worse.   Past Medical History:  Diagnosis Date  . Cataract   . COPD (chronic obstructive pulmonary disease) ([place])   . Diverticulosis    pt states he had diverticuli in his bladder also.  . H/O [facility] spotted fever 01/27/2014  . Hematuria    over 5 years ago  . Hernia of abdominal cavity   . History of rectal polyps   . Hyperplastic colon polyp   . Hypertension   . Macular degeneration   . Rectal cancer ([place]) 1994  . Shoulder pain, left    per pt, he can lay on the left side!  . Tubular adenoma    01/05/2013    Past Surgical History:  Procedure Laterality Date  . APPENDECTOMY    . COLON SURGERY     rectal cancer ~ 1999  . EYE SURGERY     cataract sx  . HERNIA REPAIR    . mastoid gland removal     left ear  . PROSTATE SURGERY  05/2015  . TONSILLECTOMY      Social History   Social History  . Marital status: Married    Spouse name: Denice Paradise  . Number of children: 2  . Years of education: N/A   Occupational History  . RetiredSocial research officer, government    Social History Main Topics  . Smoking status: Former Smoker    Quit date: 04/19/1989  . Smokeless tobacco: Never Used  . Alcohol use 8.4 oz/week    14 Glasses of wine per week   Comment: most days  , amounts varies , sometimes > 2 servings   . Drug use: No  . Sexual activity: Not on file   Other Topics Concern  . Not on file   Social History Narrative   Household-- pt, wife (she has a post polio syndrome)   Lost oldest son ~ 2011   Moved from [place] ~ 2011   Youngest son lives in [place] reason why he moved here        Allergies as of 05/11/2017   No Known Allergies     Medication List       Accurate as of 05/11/17  2:00 PM. Always use your most recent med list.          Rentz Take by mouth daily.   aspirin 81 MG tablet Take 1 tablet (81 mg total) by mouth daily.   calcium carbonate 600 MG Tabs tablet Commonly known as:  OS-CAL Take 1 tablet (600 mg total) by mouth daily with breakfast. + vitamin D   clotrimazole-betamethasone cream Commonly known as:  LOTRISONE Apply 1 application topically 2 (two) times daily.   fluticasone  50 MCG/ACT nasal spray Commonly known as:  FLONASE Place 2 sprays into both nostrils daily.   Na Sulfate-K Sulfate-Mg Sulf 17.5-3.13-1.6 GM/180ML Soln Commonly known as:  SUPREP BOWEL PREP KIT Suprep as directed / no substitutions   NON FORMULARY Ibuleve Gel-Use as needed   nortriptyline 50 MG capsule Commonly known as:  PAMELOR Take 1 capsule (50 mg total) by mouth at bedtime.   omeprazole 20 MG capsule Commonly known as:  PRILOSEC Take 1 capsule (20 mg total) by mouth daily.   OVER THE COUNTER MEDICATION Arthritis Pain Reliever Acetaminophen 63m-Take 2 tablets by mouth daily as needed.   SYSTANE OP Apply to eye as needed. Instill 1 drop in each eye as needed.          Objective:   Physical Exam BP 118/74 (BP Location: Left Arm, Patient Position: Sitting, Cuff Size: Small)   Pulse 79   Temp 98 F (36.7 C) (Oral)   Resp 12   Ht 6' 2"  (1.88 m)   Wt 217 lb 6 oz (98.6 kg)   SpO2 97%   BMI 27.91 kg/m  General:   Well developed, well nourished . NAD.  HEENT:    Normocephalic . Face symmetric, atraumatic Lungs:  CTA B Normal respiratory effort, no intercostal retractions, no accessory muscle use. Heart: RRR,  no murmur.  No pretibial edema bilaterally  Skin: Not pale. Not jaundice MSK: Right shoulder normal Left shoulder: Range of motion decrease,  passive arm elevation very painful. Neurologic:  alert & oriented X3.  Speech normal, gait appropriate for age and unassisted Psych--  Cognition and judgment appear intact.  Cooperative with normal attention span and concentration.  Behavior appropriate. No anxious or depressed appearing.      Assessment & Plan:    Assessment HTN Depression-insomnia, lost a son ~2011 GERD sx onset ~2016 DJD- hip injection 2017  BPH --s/p surgery x2 2016 , Dr PHazle NordmannH/o etoh, mild  Rectal cancer-- s/p surgery ~ 1999, no chemo-XRT FH CAD, F MI age [age]Former light smoker , No symptoms  PLAN HTN: @ last OV, was doing very well with lifestyle, lisinopril dose decreased, he couldn't cut the tablet in half so he stopped taking lisinopril. BP today is very good, continue without meds, check ambulatory BPs. See instructions Depression and insomnia: Currently doing well, on nortriptyline prn MSK: Left shoulder pain exacerbated after a recent fall, range of motion decrease, encouraged to get treatment, plans to see Dr. WNani Ravensfor possible local injection in the next few weeks. Finger infection: see last OV, improving RTC, CPX 09-2016

## 2017-05-11 NOTE — Patient Instructions (Signed)
  GO TO THE FRONT DESK Schedule your next appointment for a  Physical exam by January 2019    Check the  blood pressure  weekly   Be sure your blood pressure is between 110/65 and  140/85. If it is consistently higher or lower, let me know

## 2017-05-14 DIAGNOSIS — N401 Enlarged prostate with lower urinary tract symptoms: Secondary | ICD-10-CM | POA: Diagnosis not present

## 2017-05-14 DIAGNOSIS — N138 Other obstructive and reflux uropathy: Secondary | ICD-10-CM | POA: Diagnosis not present

## 2017-05-20 ENCOUNTER — Encounter: Payer: Self-pay | Admitting: Gastroenterology

## 2017-05-20 ENCOUNTER — Ambulatory Visit (AMBULATORY_SURGERY_CENTER): Payer: Medicare HMO | Admitting: Gastroenterology

## 2017-05-20 VITALS — BP 141/84 | HR 73 | Temp 97.5°F | Resp 17 | Ht 74.0 in | Wt 215.0 lb

## 2017-05-20 DIAGNOSIS — Z800 Family history of malignant neoplasm of digestive organs: Secondary | ICD-10-CM

## 2017-05-20 DIAGNOSIS — Z86010 Personal history of colonic polyps: Secondary | ICD-10-CM

## 2017-05-20 DIAGNOSIS — K633 Ulcer of intestine: Secondary | ICD-10-CM

## 2017-05-20 DIAGNOSIS — Z1211 Encounter for screening for malignant neoplasm of colon: Secondary | ICD-10-CM | POA: Diagnosis not present

## 2017-05-20 MED ORDER — SODIUM CHLORIDE 0.9 % IV SOLN: 500.0000 mL | INTRAVENOUS | Status: DC

## 2017-05-20 NOTE — Patient Instructions (Signed)
YOU HAD AN ENDOSCOPIC PROCEDURE TODAY AT [facility]:   Refer to the procedure report that was given to you for any specific questions about what was found during the examination.  If the procedure report does not answer your questions, please call your gastroenterologist to clarify.  If you requested that your care partner not be given the details of your procedure findings, then the procedure report has been included in a sealed envelope for you to review at your convenience later.  YOU SHOULD EXPECT: Some feelings of bloating in the abdomen. Passage of more gas than usual.  Walking can help get rid of the air that was put into your GI tract during the procedure and reduce the bloating. If you had a lower endoscopy (such as a colonoscopy or flexible sigmoidoscopy) you may notice spotting of blood in your stool or on the toilet paper. If you underwent a bowel prep for your procedure, you may not have a normal bowel movement for a few days.  Please Note:  You might notice some irritation and congestion in your nose or some drainage.  This is from the oxygen used during your procedure.  There is no need for concern and it should clear up in a day or so.  SYMPTOMS TO REPORT IMMEDIATELY:   Following lower endoscopy (colonoscopy or flexible sigmoidoscopy):  Excessive amounts of blood in the stool  Significant tenderness or worsening of abdominal pains  Swelling of the abdomen that is new, acute  Fever of 100F or higher  For urgent or emergent issues, a gastroenterologist can be reached at any hour by calling [phone].   DIET:  We do recommend a small meal at first, but then you may proceed to your regular diet.  Drink plenty of fluids but you should avoid alcoholic beverages for 24 hours.  MEDICATIONS: NO Aspirin, Ibuprofen, Naproxen, or other non-steroidal anti-inflammatory drugs for 4 weeks.  Please see handouts given to you by your recovery nurse.  ACTIVITY:  You should  plan to take it easy for the rest of today and you should NOT DRIVE or use heavy machinery until tomorrow (because of the sedation medicines used during the test).    FOLLOW UP: Our staff will call the number listed on your records the next business day following your procedure to check on you and address any questions or concerns that you may have regarding the information given to you following your procedure. If we do not reach you, we will leave a message.  However, if you are feeling well and you are not experiencing any problems, there is no need to return our call.  We will assume that you have returned to your regular daily activities without incident.  If any biopsies were taken you will be contacted by phone or by letter within the next 1-3 weeks.  Please call us at [phone] if you have not heard about the biopsies in 3 weeks.   Thank you for allowing [place] to provide for your healthcare needs today.  SIGNATURES/CONFIDENTIALITY: You and/or your care partner have signed paperwork which will be entered into your electronic medical record.  These signatures attest to the fact that that the information above on your After Visit Summary has been reviewed and is understood.  Full responsibility of the confidentiality of this discharge information lies with you and/or your care-partner.

## 2017-05-20 NOTE — Op Note (Signed)
[place] Patient Name: Devin [name] Procedure Date: 05/20/2017 9:14 AM MRN: [id number] Endoscopist: Mallie Mussel L. Loletha Carrow , MD Age: 76 Referring MD:  Date of Birth: 1941/06/09 Gender: Male Account #: [id number] Procedure:                Colonoscopy Indications:              Surveillance: Personal history of adenomatous                            polyps on last colonoscopy > 3 years ago Medicines:                Monitored Anesthesia Care Procedure:                Pre-Anesthesia Assessment:                           - Prior to the procedure, a History and Physical                            was performed, and patient medications and                            allergies were reviewed. The patient's tolerance of                            previous anesthesia was also reviewed. The risks                            and benefits of the procedure and the sedation                            options and risks were discussed with the patient.                            All questions were answered, and informed consent                            was obtained. Anticoagulants: The patient has taken                            aspirin. It was decided not to withhold this                            medication prior to the procedure. ASA Grade                            Assessment: II - A patient with mild systemic                            disease. After reviewing the risks and benefits,                            the patient was deemed in satisfactory condition to  undergo the procedure.                           After obtaining informed consent, the colonoscope                            was passed under direct vision. Throughout the                            procedure, the patient's blood pressure, pulse, and                            oxygen saturations were monitored continuously. The                            Colonoscope was introduced through the anus and                        advanced to the the cecum, identified by                            appendiceal orifice and ileocecal valve. The                            colonoscopy was performed with difficulty due to                            significant looping. Successful completion of the                            procedure was aided by using manual pressure. The                            quality of the bowel preparation was good with                            Suprep and lavage. The ileocecal valve, appendiceal                            orifice, and rectum were photographed. Scope In: 9:26:09 AM Scope Out: 9:45:02 AM Scope Withdrawal Time: 0 hours 11 minutes 36 seconds  Total Procedure Duration: 0 hours 18 minutes 53 seconds  Findings:                 The perianal and digital rectal examinations were                            normal.                           Many large-mouthed diverticula were found in the                            entire colon.  The sigmoid colon was moderately redundant.                           A single (solitary) benign-appearing six mm ulcer                            was found in the distal transverse colon. No                            bleeding was present. No stigmata of recent                            bleeding were seen.                           The exam was otherwise without abnormality on                            direct and retroflexion views. Complications:            No immediate complications. Estimated Blood Loss:     Estimated blood loss: none. Impression:               - Diverticulosis in the entire examined colon.                           - Redundant colon.                           - A single (solitary) ulcer in the distal                            transverse colon. Most likely aspirin-induced.                           - The examination was otherwise normal on direct                            and retroflexion  views.                           - No specimens collected. Recommendation:           - Patient has a contact number available for                            emergencies. The signs and symptoms of potential                            delayed complications were discussed with the                            patient. Return to normal activities tomorrow.                            Written discharge instructions were provided to the  patient.                           - Resume previous diet.                           - No aspirin, ibuprofen, naproxen, or other                            non-steroidal anti-inflammatory drugs for 4 weeks.                           - Repeat colonoscopy in 5 years for surveillance                            due to a PERSONAL HISTORY OF RECTAL CANCER. Marisel Tostenson L. Loletha Carrow, MD 05/20/2017 9:54:22 AM This report has been signed electronically.

## 2017-05-20 NOTE — Progress Notes (Signed)
A and O x3. Report to RN. Tolerated MAC anesthesia well.

## 2017-05-20 NOTE — Progress Notes (Signed)
Pt's states no medical or surgical changes since previsit or office visit. maw 

## 2017-05-21 ENCOUNTER — Telehealth: Payer: Self-pay | Admitting: *Deleted

## 2017-05-21 NOTE — Telephone Encounter (Signed)
  Follow up Call-  Call back number 05/20/2017  Post procedure Call Back phone  # [phone] hm  Permission to leave phone message Yes  Some recent data might be hidden     Patient questions:  Do you have a fever, pain , or abdominal swelling? No. Pain Score  0 *  Have you tolerated food without any problems? Yes.    Have you been able to return to your normal activities? Yes.    Do you have any questions about your discharge instructions: Diet   No. Medications  No. Follow up visit  No.  Do you have questions or concerns about your Care? No.  Actions: * If pain score is 4 or above: No action needed, pain <4.

## 2017-05-21 NOTE — Telephone Encounter (Signed)
  Follow up Call-  Call back number 05/20/2017  Post procedure Call Back phone  # [phone] hm  Permission to leave phone message Yes  Some recent data might be hidden     Number called multiple times, states your number did not go through - will try later today  EWM, RN

## 2017-06-05 DIAGNOSIS — R262 Difficulty in walking, not elsewhere classified: Secondary | ICD-10-CM | POA: Diagnosis not present

## 2017-06-05 DIAGNOSIS — M79604 Pain in right leg: Secondary | ICD-10-CM | POA: Diagnosis not present

## 2017-06-05 DIAGNOSIS — S32020A Wedge compression fracture of second lumbar vertebra, initial encounter for closed fracture: Secondary | ICD-10-CM | POA: Diagnosis not present

## 2017-06-05 DIAGNOSIS — M549 Dorsalgia, unspecified: Secondary | ICD-10-CM | POA: Diagnosis not present

## 2017-06-05 DIAGNOSIS — M5489 Other dorsalgia: Secondary | ICD-10-CM | POA: Diagnosis not present

## 2017-06-05 DIAGNOSIS — T148XXA Other injury of unspecified body region, initial encounter: Secondary | ICD-10-CM | POA: Diagnosis not present

## 2017-06-05 DIAGNOSIS — S299XXA Unspecified injury of thorax, initial encounter: Secondary | ICD-10-CM | POA: Diagnosis not present

## 2017-06-05 DIAGNOSIS — M545 Low back pain: Secondary | ICD-10-CM | POA: Diagnosis not present

## 2017-06-05 DIAGNOSIS — W01198A Fall on same level from slipping, tripping and stumbling with subsequent striking against other object, initial encounter: Secondary | ICD-10-CM | POA: Diagnosis not present

## 2017-06-05 DIAGNOSIS — S3992XA Unspecified injury of lower back, initial encounter: Secondary | ICD-10-CM | POA: Diagnosis not present

## 2017-06-05 DIAGNOSIS — M79605 Pain in left leg: Secondary | ICD-10-CM | POA: Diagnosis not present

## 2017-06-05 DIAGNOSIS — Y998 Other external cause status: Secondary | ICD-10-CM | POA: Diagnosis not present

## 2017-06-05 DIAGNOSIS — S32029A Unspecified fracture of second lumbar vertebra, initial encounter for closed fracture: Secondary | ICD-10-CM | POA: Diagnosis not present

## 2017-06-06 DIAGNOSIS — M549 Dorsalgia, unspecified: Secondary | ICD-10-CM | POA: Diagnosis not present

## 2017-06-06 DIAGNOSIS — S3992XA Unspecified injury of lower back, initial encounter: Secondary | ICD-10-CM | POA: Diagnosis not present

## 2017-06-07 ENCOUNTER — Observation Stay (HOSPITAL_COMMUNITY): Payer: Medicare HMO

## 2017-06-07 ENCOUNTER — Encounter (HOSPITAL_COMMUNITY): Payer: Self-pay | Admitting: *Deleted

## 2017-06-07 ENCOUNTER — Inpatient Hospital Stay (HOSPITAL_COMMUNITY)
Admission: EM | Admit: 2017-06-07 | Discharge: 2017-06-13 | DRG: 517 | Disposition: A | Discharge status: Home Health | Payer: Medicare HMO | Attending: Internal Medicine | Admitting: Internal Medicine

## 2017-06-07 DIAGNOSIS — F329 Major depressive disorder, single episode, unspecified: Secondary | ICD-10-CM | POA: Diagnosis present

## 2017-06-07 DIAGNOSIS — M48061 Spinal stenosis, lumbar region without neurogenic claudication: Secondary | ICD-10-CM | POA: Diagnosis present

## 2017-06-07 DIAGNOSIS — S3992XA Unspecified injury of lower back, initial encounter: Secondary | ICD-10-CM | POA: Diagnosis not present

## 2017-06-07 DIAGNOSIS — W1830XA Fall on same level, unspecified, initial encounter: Secondary | ICD-10-CM | POA: Diagnosis present

## 2017-06-07 DIAGNOSIS — S32020A Wedge compression fracture of second lumbar vertebra, initial encounter for closed fracture: Secondary | ICD-10-CM | POA: Diagnosis not present

## 2017-06-07 DIAGNOSIS — K219 Gastro-esophageal reflux disease without esophagitis: Secondary | ICD-10-CM | POA: Diagnosis present

## 2017-06-07 DIAGNOSIS — G8929 Other chronic pain: Secondary | ICD-10-CM | POA: Diagnosis present

## 2017-06-07 DIAGNOSIS — I10 Essential (primary) hypertension: Secondary | ICD-10-CM | POA: Diagnosis present

## 2017-06-07 DIAGNOSIS — M549 Dorsalgia, unspecified: Secondary | ICD-10-CM

## 2017-06-07 DIAGNOSIS — Z7401 Bed confinement status: Secondary | ICD-10-CM | POA: Diagnosis not present

## 2017-06-07 DIAGNOSIS — H3530 Unspecified macular degeneration: Secondary | ICD-10-CM | POA: Diagnosis present

## 2017-06-07 DIAGNOSIS — M4856XA Collapsed vertebra, not elsewhere classified, lumbar region, initial encounter for fracture: Principal | ICD-10-CM | POA: Diagnosis present

## 2017-06-07 DIAGNOSIS — K5903 Drug induced constipation: Secondary | ICD-10-CM | POA: Diagnosis not present

## 2017-06-07 DIAGNOSIS — Z87891 Personal history of nicotine dependence: Secondary | ICD-10-CM

## 2017-06-07 DIAGNOSIS — M545 Low back pain: Secondary | ICD-10-CM | POA: Diagnosis not present

## 2017-06-07 DIAGNOSIS — M5136 Other intervertebral disc degeneration, lumbar region: Secondary | ICD-10-CM | POA: Diagnosis present

## 2017-06-07 DIAGNOSIS — J449 Chronic obstructive pulmonary disease, unspecified: Secondary | ICD-10-CM | POA: Diagnosis present

## 2017-06-07 DIAGNOSIS — R319 Hematuria, unspecified: Secondary | ICD-10-CM | POA: Diagnosis not present

## 2017-06-07 DIAGNOSIS — Z85048 Personal history of other malignant neoplasm of rectum, rectosigmoid junction, and anus: Secondary | ICD-10-CM | POA: Diagnosis not present

## 2017-06-07 DIAGNOSIS — S32029A Unspecified fracture of second lumbar vertebra, initial encounter for closed fracture: Secondary | ICD-10-CM | POA: Diagnosis not present

## 2017-06-07 DIAGNOSIS — S32020D Wedge compression fracture of second lumbar vertebra, subsequent encounter for fracture with routine healing: Secondary | ICD-10-CM | POA: Diagnosis not present

## 2017-06-07 DIAGNOSIS — M5440 Lumbago with sciatica, unspecified side: Secondary | ICD-10-CM | POA: Diagnosis present

## 2017-06-07 LAB — BASIC METABOLIC PANEL
Anion gap: 9 (ref 5–15)
BUN: 15 mg/dL (ref 6–20)
CO2: 25 mmol/L (ref 22–32)
Calcium: 8.9 mg/dL (ref 8.9–10.3)
Chloride: 103 mmol/L (ref 101–111)
Creatinine, Ser: 0.72 mg/dL (ref 0.61–1.24)
GFR calc Af Amer: >60 mL/min (ref >60)
GLUCOSE: 102 mg/dL — AB (ref 65–99)
POTASSIUM: 3.8 mmol/L (ref 3.5–5.1)
Sodium: 137 mmol/L (ref 135–145)

## 2017-06-07 LAB — CK: Total CK: 99 U/L (ref 49–397)

## 2017-06-07 LAB — URINALYSIS, ROUTINE W REFLEX MICROSCOPIC
BILIRUBIN URINE: NEGATIVE
GLUCOSE, UA: NEGATIVE mg/dL
HGB URINE DIPSTICK: NEGATIVE
KETONES UR: 80 mg/dL — AB
Nitrite: NEGATIVE
PH: 5 (ref 5.0–8.0)
Protein, ur: 30 mg/dL — AB
SQUAMOUS EPITHELIAL / LPF: NONE SEEN
Specific Gravity, Urine: 1.028 (ref 1.005–1.030)

## 2017-06-07 LAB — CBC
HCT: 40.6 % (ref 39.0–52.0)
Hemoglobin: 13.8 g/dL (ref 13.0–17.0)
MCH: 31 pg (ref 26.0–34.0)
MCHC: 34 g/dL (ref 30.0–36.0)
MCV: 91.2 fL (ref 78.0–100.0)
PLATELETS: 136 10*3/uL — AB (ref 150–400)
RBC: 4.45 MIL/uL (ref 4.22–5.81)
RDW: 13.1 % (ref 11.5–15.5)
WBC: 8.4 10*3/uL (ref 4.0–10.5)

## 2017-06-07 MED ORDER — ONDANSETRON HCL 4 MG/2ML IJ SOLN
4.0000 mg | Freq: Once | INTRAMUSCULAR | Status: AC
  Administered 2017-06-07: 4 mg via INTRAVENOUS
  Filled 2017-06-07: qty 2

## 2017-06-07 MED ORDER — POLYETHYLENE GLYCOL 3350 17 G PO PACK: 17.0000 g | PACK | Freq: Every day | ORAL | Status: DC | PRN

## 2017-06-07 MED ORDER — HYDROMORPHONE HCL 1 MG/ML IJ SOLN
1.0000 mg | Freq: Once | INTRAMUSCULAR | Status: AC
  Administered 2017-06-07: 1 mg via INTRAVENOUS
  Filled 2017-06-07: qty 1

## 2017-06-07 MED ORDER — BISACODYL 5 MG PO TBEC
5.0000 mg | DELAYED_RELEASE_TABLET | Freq: Every day | ORAL | Status: DC | PRN
  Administered 2017-06-08: 22:00:00 5 mg via ORAL
  Filled 2017-06-07: qty 1

## 2017-06-07 MED ORDER — DIPHENHYDRAMINE HCL 50 MG/ML IJ SOLN: 12.5000 mg | Freq: Once | INTRAMUSCULAR | Status: DC

## 2017-06-07 MED ORDER — NORTRIPTYLINE HCL 25 MG PO CAPS
50.0000 mg | ORAL_CAPSULE | Freq: Every evening | ORAL | Status: DC | PRN
  Filled 2017-06-07: qty 2

## 2017-06-07 MED ORDER — MORPHINE SULFATE (PF) 4 MG/ML IV SOLN
4.0000 mg | Freq: Once | INTRAVENOUS | Status: AC
  Administered 2017-06-07: 4 mg via INTRAVENOUS
  Filled 2017-06-07: qty 1

## 2017-06-07 MED ORDER — TRAMADOL HCL 50 MG PO TABS
100.0000 mg | ORAL_TABLET | Freq: Two times a day (BID) | ORAL | Status: DC
  Administered 2017-06-07 – 2017-06-13 (×11): 100 mg via ORAL
  Filled 2017-06-07 (×11): qty 2

## 2017-06-07 MED ORDER — ENOXAPARIN SODIUM 40 MG/0.4ML ~~LOC~~ SOLN
40.0000 mg | SUBCUTANEOUS | Status: DC
  Administered 2017-06-07 – 2017-06-10 (×4): 40 mg via SUBCUTANEOUS
  Filled 2017-06-07 (×4): qty 0.4

## 2017-06-07 MED ORDER — PANTOPRAZOLE SODIUM 40 MG PO TBEC
40.0000 mg | DELAYED_RELEASE_TABLET | Freq: Every day | ORAL | Status: DC
  Administered 2017-06-07 – 2017-06-13 (×7): 40 mg via ORAL
  Filled 2017-06-07 (×7): qty 1

## 2017-06-07 MED ORDER — HYDROMORPHONE HCL-NACL 0.5-0.9 MG/ML-% IV SOSY
1.0000 mg | PREFILLED_SYRINGE | INTRAVENOUS | Status: DC | PRN
  Administered 2017-06-07 – 2017-06-12 (×24): 1 mg via INTRAVENOUS
  Filled 2017-06-07 (×25): qty 2

## 2017-06-07 MED ORDER — SODIUM CHLORIDE 0.9 % IV BOLUS (SEPSIS)
500.0000 mL | Freq: Once | INTRAVENOUS | Status: AC
  Administered 2017-06-07: 500 mL via INTRAVENOUS

## 2017-06-07 NOTE — H&P (Signed)
History and Physical  Devin [name] LPF:[id number] DOB: August 19, 1941 DOA: 06/07/2017  PCP:  Colon Branch, MD   Chief Complaint:  Back pain   History of Present Illness:  Pt is a 76 yo male who came with cc of acute back pain started 2 days ago after he fell while at home ( he slipped early morning and there was only brief LOC for a few seconds after falling and having severe back pain). He went to the ED and the CT scan of his lumbar confirmed L2 fracture and was sent home on oxycodone 325 every 4 hours and valium 5 mg but the pain was so severe that pain meds did not help and he was not able to ambulate, wife at bedside said she can't really take care of him ( recently had hip replacement herself) so he came to our ED today. He denied any other complaints except for mild nausea. He did hit his head when falling two days ago and had mild headache but did not get CT head and denied any severe headache or neurological deficits. He also denied incontinence or LE sensory/motor deficits but he has right leg pain that is due to sciatica and has been there on and off for years.   Review of Systems:  CONSTITUTIONAL:     No night sweats.  No fatigue.  No fever. No chills. Eyes:                            No visual changes.  No eye pain.  No eye discharge.   ENT:                              No epistaxis.  No sinus pain.  No sore throat.   No congestion. RESPIRATORY:           No cough.  No wheeze.  No hemoptysis.  No dyspnea CARDIOVASCULAR   :  No chest pains.  No palpitations. GASTROINTESTINAL:  No abdominal pain.  No nausea. No vomiting.  No diarrhea. No constipation.  No hematemesis.  No hematochezia.  No melena. GENITOURINARY:      No urgency.  No frequency.  No dysuria.  No hematuria.  No   obstructive symptoms.  No discharge.  No pain.   MUSCULOSKELETAL:  +musculoskeletal pain.  No joint swelling.  No arthritis. NEUROLOGICAL:        No confusion.  No weakness. No headache. No  seizure. PSYCHIATRIC:             No depression. No anxiety. No suicidal ideation. SKIN:                             No rashes.  No lesions.  No wounds. ENDOCRINE:                No weight loss.  No polydipsia.  No polyuria.  No polyphagia. HEMATOLOGIC:           No purpura.  No petechiae.  No bleeding.  ALLERGIC                 : No pruritus.  No angioedema Other:  Past Medical and Surgical History:   Past Medical History:  Diagnosis Date  . Cataract   . COPD (chronic obstructive pulmonary disease) ([place])   . Diverticulosis  pt states he had diverticuli in his bladder also.  . H/O [facility] spotted fever 01/27/2014  . Hematuria    over 5 years ago  . Hernia of abdominal cavity   . History of rectal polyps   . Hyperplastic colon polyp   . Hypertension   . Macular degeneration   . Rectal cancer ([place]) 1994  . Shoulder pain, left    per pt, he can lay on the left side!  . Tubular adenoma    01/05/2013   Past Surgical History:  Procedure Laterality Date  . APPENDECTOMY    . COLON SURGERY     rectal cancer ~ 1999  . EYE SURGERY     cataract sx  . HERNIA REPAIR    . mastoid gland removal     left ear  . PROSTATE SURGERY  05/2015  . TONSILLECTOMY      Social History:   reports that he quit smoking about 28 years ago. He has never used smokeless tobacco. He reports that he drinks about 8.4 oz of alcohol per week . He reports that he does not use drugs.    No Known Allergies  Family History  Problem Relation Age of Onset  . Breast cancer Mother   . Colon cancer Mother 53       died 63  . Aneurysm Father   . Heart attack Father        35  . Aneurysm Son   . Stroke Paternal Grandfather   . Cancer Paternal Grandmother   . Cancer Maternal Grandmother   . Prostate cancer Neg Hx   . Esophageal cancer Neg Hx   . Pancreatic cancer Neg Hx   . Rectal cancer Neg Hx   . Stomach cancer Neg Hx       Prior to Admission medications   Medication Sig Start Date End  Date Taking? Authorizing Provider  acetaminophen (TYLENOL) 500 MG tablet Take 1,000 mg by mouth every 6 (six) hours as needed for mild pain.   Yes [provider]  calcium carbonate (OS-CAL) 600 MG TABS tablet Take 1 tablet (600 mg total) by mouth daily with breakfast. + vitamin D 12/19/14  Yes Midge Minium, MD  Multiple Vitamins-Minerals (50+ ADULT EYE HEALTH PO) Take 1 capsule by mouth daily.    Yes [provider]  nortriptyline (PAMELOR) 50 MG capsule Take 1 capsule (50 mg total) by mouth at bedtime. Patient taking differently: Take 50 mg by mouth at bedtime as needed (sleep).  03/23/17  Yes Shelda Pal, DO  omeprazole (PRILOSEC) 20 MG capsule Take 1 capsule (20 mg total) by mouth daily. Patient taking differently: Take 20 mg by mouth daily as needed (heartburn).  06/17/16  Yes Colon Branch, MD    Physical Exam: BP (!) 145/79 (BP Location: Left Arm)   Pulse 94   Temp 98.3 F (36.8 C) (Oral)   Resp (!) 23   Ht 6\' 2"  (1.88 m)   Wt 97.5 kg (215 lb)   SpO2 91%   BMI 27.60 kg/m   GENERAL :   Alert and cooperative, and appears to be in no acute distress. HEAD:           normocephalic. EYES:            PERRL, EOMI.  vision is grossly intact. EARS:           hearing grossly intact. NOSE:           No  nasal discharge. NECK:          supple CARDIAC:    Normal S1 and S2. No gallop. No murmurs.  Vascular:     no peripheral edema.  LUNGS:       Clear to auscultation  ABDOMEN: Positive bowel sounds. Soft, nondistended, nontender. No guarding or rebound.      MSK:           Pos straight leg test in the right  EXT           : No significant deformity or joint abnormality. Neuro        : Alert, oriented to person, place, and time.                      CN II-XII intact.  SKIN:            No rash. No lesions. PSYCH:       No hallucination. Patient is not suicidal.          Labs on Admission:  Reviewed.   Radiological Exams on Admission: No results  found.  Lumbar CT from outside hospital on 06/05/17 1. Acute mildly comminuted L2 vertebral body fracture with mild loss of height anteriorly. No posterior cortex involvement or bony retropulsion. 2. 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 Assessment/Plan  Acute compression L2 fracture with severe back pain and sciatic pain that is chronic. Will keep in obs for adequate pain management ( started on Tramadol Q12H and dilaudid prn), PT eval and ortho eval in am. Will order CT head as it was not done after trauma to his head.   Will check bladder scan and send for ucx. Pt denied any UTI symptoms.   Input & Output: NA Lines & Tubes: PIV DVT prophylaxis: Dane enoxaparin  GI prophylaxis: PPI Consultants: PT/ortho Code Status: full Family Communication: at bedside  Disposition Plan: TBD    Gennaro Africa M.D [facility]

## 2017-06-07 NOTE — Progress Notes (Signed)
Patient requesting medication for acid reflux. Nurse paged hospitalist on-call to request medcation.

## 2017-06-07 NOTE — Progress Notes (Signed)
Patient refuses low bed. Nurse explained importance of using low bed to increase patient safety. Patient understands reasoning and requests to remain in current bed.

## 2017-06-07 NOTE — ED Provider Notes (Signed)
[place] DEPT Provider Note   CSN: [id number] Arrival date & time: 06/07/17  1305     History   Chief Complaint Chief Complaint  Patient presents with  . Fall  . L2 Compression FX    HPI Devin [name] is a 76 y.o. male presenting with back pain s/p fall.   Pt states that he fell on Friday, and twisted so he landed on his back. It was a mechanical fall in which his foot got caught. He initially tried to manage his sxs with tylenol and ibuprofen, but was in too much pain, so he went to [facility] ED. He was evaluated in the ED and found to have a L2 compression fracture without evidence of cauda equina. He was given percocet and valium for pain control. He was d/c home, but states that this morning, he was unable to get out of bed due to pain. He is unable to move his legs at all due to pain, and now has radiation of pain down the back of his his R leg. Pain is at midline lower back, radiating to the R. Movement makes it worse, pain medicine makes it better for a short period of time (~2 hrs). He denies numbness or tingling. He denies saddle paresthesias. He reports decreased urine output, stating he has urinated x2 since his fall. His pain is uncontrolled by his medications. He presents today for further help managing his pain.    HPI  Past Medical History:  Diagnosis Date  . Cataract   . COPD (chronic obstructive pulmonary disease) ([place])   . Diverticulosis    pt states he had diverticuli in his bladder also.  . H/O [facility] spotted fever 01/27/2014  . Hematuria    over 5 years ago  . Hernia of abdominal cavity   . History of rectal polyps   . Hyperplastic colon polyp   . Hypertension   . Macular degeneration   . Rectal cancer ([place]) 1994  . Shoulder pain, left    per pt, he can lay on the left side!  . Tubular adenoma    01/05/2013    Patient Active Problem List   Diagnosis Date Noted  . Insomnia 10/14/2016  . DJD (degenerative joint disease) 10/14/2016    . PCP NOTES >>>>>>>>>>>>>>>>>>>>>>>>> 11/28/2015  . Benign prostatic hyperplasia with urinary obstruction 05/30/2015  . Cancer ([place]) 05/30/2015  . Fungal dermatitis 02/23/2015  . Annual physical exam 11/27/2014  . Essential hypertension 10/02/2014  . Alcohol use disorder, mild, abuse 10/02/2014  . Bruit 08/09/2014  . Medication management 07/27/2014  . GERD (gastroesophageal reflux disease) 05/25/2014  . History of rectal cancer 05/15/2014  . Personal history of colonic polyps 05/15/2014  . Diverticulosis of colon (without mention of hemorrhage) 05/15/2014  . Family history of malignant neoplasm of gastrointestinal tract 05/15/2014  . Depression 04/19/2014    Past Surgical History:  Procedure Laterality Date  . APPENDECTOMY    . COLON SURGERY     rectal cancer ~ 1999  . EYE SURGERY     cataract sx  . HERNIA REPAIR    . mastoid gland removal     left ear  . PROSTATE SURGERY  05/2015  . TONSILLECTOMY         Home Medications    Prior to Admission medications   Medication Sig Start Date End Date Taking? Authorizing Provider  acetaminophen (TYLENOL) 500 MG tablet Take 1,000 mg by mouth every 6 (six) hours as needed for mild  pain.   Yes [provider]  calcium carbonate (OS-CAL) 600 MG TABS tablet Take 1 tablet (600 mg total) by mouth daily with breakfast. + vitamin D 12/19/14  Yes Midge Minium, MD  Multiple Vitamins-Minerals (50+ ADULT EYE HEALTH PO) Take 1 capsule by mouth daily.    Yes [provider]  nortriptyline (PAMELOR) 50 MG capsule Take 1 capsule (50 mg total) by mouth at bedtime. Patient taking differently: Take 50 mg by mouth at bedtime as needed (sleep).  03/23/17  Yes Shelda Pal, DO  omeprazole (PRILOSEC) 20 MG capsule Take 1 capsule (20 mg total) by mouth daily. Patient taking differently: Take 20 mg by mouth daily as needed (heartburn).  06/17/16  Yes Colon Branch, MD    Family History Family History  Problem Relation  Age of Onset  . Breast cancer Mother   . Colon cancer Mother 18       died 56  . Aneurysm Father   . Heart attack Father        23  . Aneurysm Son   . Stroke Paternal Grandfather   . Cancer Paternal Grandmother   . Cancer Maternal Grandmother   . Prostate cancer Neg Hx   . Esophageal cancer Neg Hx   . Pancreatic cancer Neg Hx   . Rectal cancer Neg Hx   . Stomach cancer Neg Hx     Social History Social History  Substance Use Topics  . Smoking status: Former Smoker    Quit date: 04/19/1989  . Smokeless tobacco: Never Used  . Alcohol use 8.4 oz/week    14 Glasses of wine per week     Comment: most days  , amounts varies , sometimes > 2 servings      Allergies   Patient has no known allergies.   Review of Systems Review of Systems  Constitutional: Negative for chills and fever.  HENT: Negative for congestion and sore throat.   Eyes: Negative for photophobia and visual disturbance.  Respiratory: Negative for cough, chest tightness and shortness of breath.   Cardiovascular: Negative for chest pain and palpitations.  Gastrointestinal: Negative for abdominal pain, constipation, diarrhea, nausea and vomiting.  Genitourinary: Positive for decreased urine volume. Negative for dysuria. Hematuria: dark urine.  Musculoskeletal: Positive for back pain and gait problem. Negative for neck pain.  Skin: Negative for wound.  Neurological: Negative for dizziness, light-headedness, numbness and headaches.  Hematological: Does not bruise/bleed easily.  Psychiatric/Behavioral: Negative for confusion and decreased concentration.     Physical Exam Updated Vital Signs BP (!) 164/88 (BP Location: Right Arm)   Pulse 94   Temp 98.3 F (36.8 C) (Oral)   Ht 6\' 2"  (1.88 m)   Wt 97.5 kg (215 lb)   SpO2 93%   BMI 27.60 kg/m   Physical Exam  Constitutional: He is oriented to person, place, and time. He appears well-developed and well-nourished. No distress.  Pt appears uncomfortable due  to pain  HENT:  Head: Normocephalic and atraumatic.  Eyes: Pupils are equal, round, and reactive to light. EOM are normal.  Neck: Normal range of motion.  Cardiovascular: Normal rate, regular rhythm and intact distal pulses.   Pulmonary/Chest: Effort normal and breath sounds normal. No respiratory distress. He has no wheezes. He exhibits no tenderness.  Abdominal: Soft. Bowel sounds are normal. He exhibits no distension and no mass. There is no tenderness. There is no rebound and no guarding.  Musculoskeletal: Normal range of motion.  Unable to move  lower ext due to pain. Full ROM of ankles bilaterally. Pedal pulses intact bilaterally. Color and warmth equal bilaterally. Sensation intact bilaterally. compartments soft.   Neurological: He is alert and oriented to person, place, and time. He has normal strength and normal reflexes. No cranial nerve deficit or sensory deficit. GCS eye subscore is 4. GCS verbal subscore is 5. GCS motor subscore is 6.  No sensory deficits. No saddle anesthesia. DTRs intact  Skin: Skin is warm and dry.  Nursing note and vitals reviewed.    ED Treatments / Results  Labs (all labs ordered are listed, but only abnormal results are displayed) Labs Reviewed  CBC  BASIC METABOLIC PANEL  URINALYSIS, ROUTINE W REFLEX MICROSCOPIC  CK    EKG  EKG Interpretation None       Radiology No results found.  Procedures Procedures (including critical care time)  Medications Ordered in ED Medications  sodium chloride 0.9 % bolus 500 mL (500 mLs Intravenous New Bag/Given 06/07/17 1534)  sodium chloride 0.9 % bolus 500 mL (500 mLs Intravenous New Bag/Given 06/07/17 1536)  morphine 4 MG/ML injection 4 mg (4 mg Intravenous Given 06/07/17 1539)     Initial Impression / Assessment and Plan / ED Course  I have reviewed the triage vital signs and the nursing notes.  Pertinent labs & imaging results that were available during my care of the patient were reviewed by me and  considered in my medical decision making (see chart for details).  Clinical Course as of Jun 08 1716  Sun Jun 07, 2017  [place] Patient recheck. The patient reports his pain went from 3/10 to 10/10 over the last hour.  [MM]  1930 Patient recheck. Patient's has received Zofran and Dilaudid. SaO2 dropping to 87%. Titrated the patient's SaO2 to 93% on 2L [place].  [MM]    Clinical Course User Index [MM] McDonald, Mia A, PA-C    Pt presenting with back pain s/p fall in Friday. Was evaluated yesterday and dx with L2 compression fx by CT. Physical exam shows pt appears uncomfortable due to pain. No obvious neurologic deficits. Doubt cauda equina currently. Will order basic labs, CK, and UA for further assessment. I do not believe repeat imaging is necessary. Will give IVF and pain control. Case discussed with attending, and Dr. Regenia Skeeter evaluated the pt.   On reassessment, pt's pain improved with morphine. Labs and UA are pending.   Pt signed out to Donavan Foil, PA-C, who will continue to monitor the pt's pain and evaluated labs. If pain cannot be controlled adequately, consider possible need for admission.    Final Clinical Impressions(s) / ED Diagnoses   Final diagnoses:  None    New Prescriptions New Prescriptions   No medications on file     Franchot Heidelberg, PA-C 06/08/17 1718    Sherwood Gambler, MD 06/12/17 1109

## 2017-06-07 NOTE — ED Notes (Signed)
Bed: WA21 Expected date:  Expected time:  Means of arrival:  Comments: 76 yo back pain

## 2017-06-07 NOTE — ED Provider Notes (Signed)
Patient received at sign out from [place]. Per her HPI:  "Devin [name] is a 76 y.o. male presenting with back pain s/p fall.   Pt states that he fell on Friday, and twisted so he landed on his back. It was a mechanical fall in which his foot got caught. He initially tried to manage his sxs with tylenol and ibuprofen, but was in too much pain, so he went to [facility] ED. He was evaluated in the ED and found to have a L2 compression fracture without evidence of cauda equina. He was given percocet and valium for pain control. He was d/c home, but states that this morning, he was unable to get out of bed due to pain. He is unable to move his legs at all due to pain, and now has radiation of pain down the back of his his R leg. Pain is at midline lower back, radiating to the R. Movement makes it worse, pain medicine makes it better for a short period of time (~2 hrs). He denies numbness or tingling. He denies saddle paresthesias. He reports decreased urine output, stating he has urinated x2 since his fall. His pain is uncontrolled by his medications. He presents today for further help managing his pain."    Physical Exam  BP 131/69 (BP Location: Left Arm)   Pulse 80   Temp 97.6 F (36.4 C) (Oral)   Resp 18   Ht 6\' 2"  (1.88 m)   Wt 97.5 kg (215 lb)   SpO2 92%   BMI 27.60 kg/m   Physical Exam Good strength against resistance of the bilateral lower extremities. NVI.   ED Course  Procedures  MDM  76 year old male with a h/o of an L2 compression fracture with uncontrolled pain. Morphine given by Pa C[place]ale was unsuccessful at controlling the patient's pain. The patient was seen and evaluated by Dr. Lacinda Axon, attending physician. Dilaudid 1 mg was given with improved of the patient's pain. After administration, the patient's SaO2 dropped to 85% and I placed the patient on 2L of O2 [place] to titrate him to 94%. UA and CK are unremarkable. Consulted hospitalist, Dr. Dreama Saa to discuss admission  for pain control and placement options since the patient is unable to complete ADLS and his wife is unable to care for him in his current state. He will admit the patient for continued evaluation. The patient appears reasonably stabilized for admission considering the current resources, flow, and capabilities available in the ED at this time, and I doubt any other [facility] requiring further screening and/or treatment in the ED prior to admission.      Joline Maxcy A, PA-C 06/09/17 0356    Nat Christen, MD 06/10/17 [phone]

## 2017-06-07 NOTE — ED Triage Notes (Signed)
Pt fell on Friday, seen at [facility], dx L2 compression fx. Today unable to get out of bed, has blood in urine and pain going down rt leg

## 2017-06-08 DIAGNOSIS — K219 Gastro-esophageal reflux disease without esophagitis: Secondary | ICD-10-CM | POA: Diagnosis present

## 2017-06-08 DIAGNOSIS — G8929 Other chronic pain: Secondary | ICD-10-CM | POA: Diagnosis present

## 2017-06-08 DIAGNOSIS — W1830XA Fall on same level, unspecified, initial encounter: Secondary | ICD-10-CM | POA: Diagnosis present

## 2017-06-08 DIAGNOSIS — H3530 Unspecified macular degeneration: Secondary | ICD-10-CM | POA: Diagnosis present

## 2017-06-08 DIAGNOSIS — S32020D Wedge compression fracture of second lumbar vertebra, subsequent encounter for fracture with routine healing: Secondary | ICD-10-CM | POA: Diagnosis not present

## 2017-06-08 DIAGNOSIS — M5136 Other intervertebral disc degeneration, lumbar region: Secondary | ICD-10-CM | POA: Diagnosis present

## 2017-06-08 DIAGNOSIS — M48061 Spinal stenosis, lumbar region without neurogenic claudication: Secondary | ICD-10-CM | POA: Diagnosis present

## 2017-06-08 DIAGNOSIS — I10 Essential (primary) hypertension: Secondary | ICD-10-CM | POA: Diagnosis present

## 2017-06-08 DIAGNOSIS — Z85048 Personal history of other malignant neoplasm of rectum, rectosigmoid junction, and anus: Secondary | ICD-10-CM | POA: Diagnosis not present

## 2017-06-08 DIAGNOSIS — Z7401 Bed confinement status: Secondary | ICD-10-CM | POA: Diagnosis not present

## 2017-06-08 DIAGNOSIS — M4856XA Collapsed vertebra, not elsewhere classified, lumbar region, initial encounter for fracture: Secondary | ICD-10-CM | POA: Diagnosis present

## 2017-06-08 DIAGNOSIS — J449 Chronic obstructive pulmonary disease, unspecified: Secondary | ICD-10-CM | POA: Diagnosis present

## 2017-06-08 DIAGNOSIS — Z87891 Personal history of nicotine dependence: Secondary | ICD-10-CM | POA: Diagnosis not present

## 2017-06-08 DIAGNOSIS — F329 Major depressive disorder, single episode, unspecified: Secondary | ICD-10-CM | POA: Diagnosis present

## 2017-06-08 DIAGNOSIS — K5903 Drug induced constipation: Secondary | ICD-10-CM | POA: Diagnosis not present

## 2017-06-08 DIAGNOSIS — M545 Low back pain: Secondary | ICD-10-CM | POA: Diagnosis not present

## 2017-06-08 DIAGNOSIS — M5440 Lumbago with sciatica, unspecified side: Secondary | ICD-10-CM | POA: Diagnosis present

## 2017-06-08 DIAGNOSIS — S32020A Wedge compression fracture of second lumbar vertebra, initial encounter for closed fracture: Secondary | ICD-10-CM | POA: Diagnosis not present

## 2017-06-08 MED ORDER — CYCLOBENZAPRINE HCL 5 MG PO TABS
5.0000 mg | ORAL_TABLET | Freq: Three times a day (TID) | ORAL | Status: DC | PRN
  Administered 2017-06-08 – 2017-06-13 (×4): 5 mg via ORAL
  Filled 2017-06-08 (×5): qty 1

## 2017-06-08 NOTE — Progress Notes (Addendum)
PROGRESS NOTE  Devin [name] OMV:[id number] DOB: 18-Nov-1940 DOA: 06/07/2017 PCP: Colon Branch, MD   LOS: 0 days   Brief Narrative / Interim history: 76 yo male who came with cc of acute back pain started 2 days ago after he fell while at home ( he slipped early morning and there was only brief LOC for a few seconds after falling and having severe back pain). He went to the ED and the CT scan of his lumbar confirmed L2 fracture and was sent home on oxycodone 325 every 4 hours and valium 5 mg but the pain was so severe that pain meds did not help and he was not able to ambulate  Assessment & Plan: Active Problems:   Compression fracture of L2 (HCC)   Compression fracture of the L2 -He is currently essentially bedbound as he is unable to move at all.  Any movement causes excruciating back pain. -Have asked radiology to see if kyphoplasty would be helpful for him, appreciate input -PT to see -Continue scheduled tramadol, continue IV Dilaudid as needed and -I have added Flexeril  History of depression -On nortriptyline  Hypertension -Used to be on lisinopril, currently off of it.  Blood pressure controlled now  GERD -Continue PPI   DVT prophylaxis: Lovenox Code Status: Full code Family Communication: No family at bedside Disposition Plan: Home versus SNF when ready  Consultants:   Interventional radiology  Procedures:   None   Antimicrobials:  None    Subjective: - no chest pain, shortness of breath, no abdominal pain, nausea or vomiting.  Laying flat on his back afraid to move at all  Objective: Vitals:   06/07/17 1930 06/07/17 2000 06/07/17 2103 06/08/17 0418  BP: 135/78 132/79 (!) 146/79 132/76  Pulse: 95 95 95 90  Resp: (!) 26 (!) 23 20 19   Temp:   98.4 F (36.9 C) 98 F (36.7 C)  TempSrc:   Oral Oral  SpO2: 94% 91% 93% 93%  Weight:      Height:        Intake/Output Summary (Last 24 hours) at 06/08/17 1130 Last data filed at 06/08/17 0900  Gross per 24  hour  Intake              520 ml  Output                0 ml  Net              520 ml   Filed Weights   06/07/17 1307  Weight: 97.5 kg (215 lb)    Examination:  Vitals:   06/07/17 1930 06/07/17 2000 06/07/17 2103 06/08/17 0418  BP: 135/78 132/79 (!) 146/79 132/76  Pulse: 95 95 95 90  Resp: (!) 26 (!) 23 20 19   Temp:   98.4 F (36.9 C) 98 F (36.7 C)  TempSrc:   Oral Oral  SpO2: 94% 91% 93% 93%  Weight:      Height:        Constitutional: NAD Eyes: lids and conjunctivae normal Respiratory: clear to auscultation bilaterally on anterior auscultation, no wheezing, no crackles. Normal respiratory effort. No accessory muscle use.  Cardiovascular: Regular rate and rhythm, no murmurs / rubs / gallops. No LE edema. 2+ pedal pulses. No carotid bruits.  Abdomen: no tenderness. Bowel sounds positive.  Neurologic: non focal  Psychiatric: Normal judgment and insight. Alert and oriented x 3. Normal mood.    Data Reviewed: I have independently reviewed following labs and  imaging studies  CBC:  Recent Labs Lab 06/07/17 1554  WBC 8.4  HGB 13.8  HCT 40.6  MCV 91.2  PLT 106*   Basic Metabolic Panel:  Recent Labs Lab 06/07/17 1554  NA 137  K 3.8  CL 103  CO2 25  GLUCOSE 102*  BUN 15  CREATININE 0.72  CALCIUM 8.9   GFR: Estimated Creatinine Clearance: 92.8 mL/min (by C-G formula based on SCr of 0.72 mg/dL). Liver Function Tests: No results for input(s): AST, ALT, ALKPHOS, BILITOT, PROT, ALBUMIN in the last 168 hours. No results for input(s): LIPASE, AMYLASE in the last 168 hours. No results for input(s): AMMONIA in the last 168 hours. Coagulation Profile: No results for input(s): INR, PROTIME in the last 168 hours. Cardiac Enzymes:  Recent Labs Lab 06/07/17 1554  CKTOTAL 99   BNP (last 3 results) No results for input(s): PROBNP in the last 8760 hours. HbA1C: No results for input(s): HGBA1C in the last 72 hours. CBG: No results for input(s): GLUCAP in the  last 168 hours. Lipid Profile: No results for input(s): CHOL, HDL, LDLCALC, TRIG, CHOLHDL, LDLDIRECT in the last 72 hours. Thyroid Function Tests: No results for input(s): TSH, T4TOTAL, FREET4, T3FREE, THYROIDAB in the last 72 hours. Anemia Panel: No results for input(s): VITAMINB12, FOLATE, FERRITIN, TIBC, IRON, RETICCTPCT in the last 72 hours. Urine analysis:    Component Value Date/Time   COLORURINE YELLOW 06/07/2017 1414   APPEARANCEUR HAZY (A) 06/07/2017 1414   LABSPEC 1.028 06/07/2017 1414   PHURINE 5.0 06/07/2017 1414   GLUCOSEU NEGATIVE 06/07/2017 1414   HGBUR NEGATIVE 06/07/2017 1414   BILIRUBINUR NEGATIVE 06/07/2017 1414   KETONESUR 80 (A) 06/07/2017 1414   PROTEINUR 30 (A) 06/07/2017 1414   NITRITE NEGATIVE 06/07/2017 1414   LEUKOCYTESUR TRACE (A) 06/07/2017 1414   Sepsis Labs: Invalid input(s): PROCALCITONIN, LACTICIDVEN  No results found for this or any previous visit (from the past 240 hour(s)).    Radiology Studies: No results found.   Scheduled Meds: . enoxaparin (LOVENOX) injection  40 mg Subcutaneous Q24H  . pantoprazole  40 mg Oral Daily  . traMADol  100 mg Oral Q12H   Continuous Infusions:   Marzetta Board, MD, PhD [facility] Pager [phone]  If 7PM-7AM, please contact night-coverage [email or web address] Password [email or web address] 06/08/2017, 11:30 AM

## 2017-06-08 NOTE — Progress Notes (Signed)
Patient ID: Devin [name], male   DOB: 01-28-1941, 76 y.o.   MRN: [id number] Aware of request for VP/KP on patient with L2 fracture on outside imaging. Will make further recommendations following findings of recently ordered MRI lumbar spine. Insurance preauthorization in progress.

## 2017-06-08 NOTE — Evaluation (Signed)
Physical Therapy Evaluation Patient Details Name: Devin [name] MRN: [id number] DOB: [birth year] Today's Date: 06/08/2017   History of Present Illness  76 yo male who came with cc of acute back pain started 2 days ago after he fell while at home.  CT scan of his lumbar confirmed L2 fracture and was sent home on oxycodone and valium but the pain was so severe that pain meds did not help and he was not able to ambulate.  Clinical Impression  Pt admitted with above diagnosis. Pt currently with functional limitations due to the deficits listed below (see PT Problem List).  Pt will benefit from skilled PT to increase their independence and safety with mobility to allow discharge to the venue listed below.  Pt able to move LEs against gravity in supine position without pain however unable to tolerate sitting upright on EOB.  Pt reports his son and spouse have been attempting to assist him at home.  Pt does not believe he can return home in current condition at this time.  His son has a job and his spouse is recovering from recent hip surgery.  Once pt's pain in controlled, he will likely progress well with mobility.      Follow Up Recommendations SNF;Supervision/Assistance - 24 hour    Equipment Recommendations  Wheelchair (measurements PT)    Recommendations for Other Services       Precautions / Restrictions Precautions Precautions: Fall;Back Precaution Comments: back precautions for comfort with L2 compression fx      Mobility  Bed Mobility Overal bed mobility: Needs Assistance Bed Mobility: Rolling;Sidelying to Sit Rolling: Supervision Sidelying to sit: Total assist       General bed mobility comments: verbal cues for log roll technique, pt able to turn to his left side, pt unable to tolerate bringing LEs towards floor and trunk upright, resistant to assist, appears more fearful of pain, attempted to sit upright however pt unable to tolerate and returned to flat supine  position  Transfers                    Ambulation/Gait                Stairs            Wheelchair Mobility    Modified Rankin (Stroke Patients Only)       Balance Overall balance assessment: History of Falls                                           Pertinent Vitals/Pain Pain Assessment: 0-10 Pain Score: 7  Pain Location: back Pain Descriptors / Indicators: Sore;Aching Pain Intervention(s): Limited activity within patient's tolerance;Repositioned;Monitored during session;Premedicated before session    Home Living Family/patient expects to be discharged to:: Private residence Living Arrangements: Spouse/significant other   Type of Home: [place]: One level Home Equipment: Environmental consultant - 2 wheels      Prior Function           Comments: pt previously independent, however after onset of back pain, son and spouse have been attempting to assist him with mobility, pt reports "passing out" on his son with standing assist prior to admission     Hand Dominance        Extremity/Trunk Assessment        Lower Extremity Assessment Lower Extremity  Assessment: Overall WFL for tasks assessed (pt able to move LEs against gravity in bed without pain)       Communication   Communication: No difficulties  Cognition Arousal/Alertness: Awake/alert Behavior During Therapy: WFL for tasks assessed/performed Overall Cognitive Status: Within Functional Limits for tasks assessed                                        General Comments      Exercises     Assessment/Plan    PT Assessment Patient needs continued PT services  PT Problem List Decreased strength;Decreased mobility;Decreased activity tolerance;Decreased knowledge of use of DME;Pain;Decreased knowledge of precautions       PT Treatment Interventions DME instruction;Therapeutic activities;Therapeutic exercise;Gait training;Patient/family  education;Functional mobility training;Wheelchair mobility training    PT Goals (Current goals can be found in the Care Plan section)  Acute Rehab PT Goals PT Goal Formulation: With patient Time For Goal Achievement: 06/15/17 Potential to Achieve Goals: Good    Frequency Min 3X/week   Barriers to discharge        Co-evaluation               AM-PAC PT "6 Clicks" Daily Activity  Outcome Measure Difficulty turning over in bed (including adjusting bedclothes, sheets and blankets)?: None Difficulty moving from lying on back to sitting on the side of the bed? : Unable Difficulty sitting down on and standing up from a chair with arms (e.g., wheelchair, bedside commode, etc,.)?: Unable Help needed moving to and from a bed to chair (including a wheelchair)?: Total Help needed walking in hospital room?: Total Help needed climbing 3-5 steps with a railing? : Total 6 Click Score: 9    End of Session   Activity Tolerance: Patient limited by pain Patient left: in bed;with call bell/phone within reach   PT Visit Diagnosis: Difficulty in walking, not elsewhere classified (R26.2);Pain Pain - part of body:  (back)    Time: 1355-1409 PT Time Calculation (min) (ACUTE ONLY): 14 min   Charges:   PT Evaluation $PT Eval Low Complexity: 1 Low     PT G Codes:   PT G-Codes **NOT FOR INPATIENT CLASS** Functional Assessment Tool Used: Clinical judgement;AM-PAC 6 Clicks Basic Mobility Functional Limitation: Mobility: Walking and moving around Mobility: Walking and Moving Around Current Status (Z3007): At least 80 percent but less than 100 percent impaired, limited or restricted Mobility: Walking and Moving Around Goal Status [phone]): At least 20 percent but less than 40 percent impaired, limited or restricted   Carmelia Bake, PT, DPT 06/08/2017 Pager: [phone]  York Ram E 06/08/2017, 3:30 PM

## 2017-06-09 ENCOUNTER — Inpatient Hospital Stay (HOSPITAL_COMMUNITY): Payer: Medicare HMO

## 2017-06-09 DIAGNOSIS — S32020D Wedge compression fracture of second lumbar vertebra, subsequent encounter for fracture with routine healing: Secondary | ICD-10-CM

## 2017-06-09 LAB — URINE CULTURE: CULTURE: NO GROWTH

## 2017-06-09 MED ORDER — DIPHENHYDRAMINE HCL 25 MG PO CAPS
25.0000 mg | ORAL_CAPSULE | Freq: Two times a day (BID) | ORAL | Status: DC | PRN
  Administered 2017-06-09 – 2017-06-10 (×3): 25 mg via ORAL
  Filled 2017-06-09 (×3): qty 1

## 2017-06-09 MED ORDER — GADOBENATE DIMEGLUMINE 529 MG/ML IV SOLN
20.0000 mL | Freq: Once | INTRAVENOUS | Status: AC | PRN
  Administered 2017-06-09: 08:00:00 20 mL via INTRAVENOUS

## 2017-06-09 MED ORDER — CEFAZOLIN SODIUM-DEXTROSE 2-4 GM/100ML-% IV SOLN
2.0000 g | INTRAVENOUS | Status: AC
  Filled 2017-06-09: qty 100

## 2017-06-09 NOTE — Progress Notes (Signed)
PT Cancellation Note  Patient Details Name: Devin [name] MRN: [id number] DOB: [birth year]   Cancelled Treatment:     pt at [place]  PTA [facility]  Acute  Rehab Pager      [phone]

## 2017-06-09 NOTE — Consult Note (Signed)
Chief Complaint: Patient was seen in consultation today for L2 vertebroplasty/kyphoplasty Chief Complaint  Patient presents with  . Fall  . L2 Compression FX    Referring Physician(s): Gherghe,C  Supervising Physician: Marybelle Killings  Patient Status: [facility] - In-pt  History of Present Illness: Devin [name] is a 76 y.o. male with remote history of rectal cancer 1994, hypertension, COPD who was seen recently at outside ED for acute low back pain following fall at home. CT scan at the time revealed L2 compression fracture. He was treated conservatively with narcotics, however failed to improve. He now has difficulty with ambulation secondary to the pain. Follow-up MRI on 06/09/17 revealed acute L2 vertebral body fracture with 25% height loss. No retropulsion. Advanced L4-5 facet arthrosis with grade 1 anteriolisthesis and mild spinal stenosis. Disc and facet degeneration also noted. Request now received from primary care service for L2 vertebroplasty/kyphoplasty.  Past Medical History:  Diagnosis Date  . Cataract   . COPD (chronic obstructive pulmonary disease) ([place])   . Diverticulosis    pt states he had diverticuli in his bladder also.  . H/O [facility] spotted fever 01/27/2014  . Hematuria    over 5 years ago  . Hernia of abdominal cavity   . History of rectal polyps   . Hyperplastic colon polyp   . Hypertension   . Macular degeneration   . Rectal cancer ([place]) 1994  . Shoulder pain, left    per pt, he can lay on the left side!  . Tubular adenoma    01/05/2013    Past Surgical History:  Procedure Laterality Date  . APPENDECTOMY    . COLON SURGERY     rectal cancer ~ 1999  . EYE SURGERY     cataract sx  . HERNIA REPAIR    . mastoid gland removal     left ear  . PROSTATE SURGERY  05/2015  . TONSILLECTOMY      Allergies: Patient has no known allergies.  Medications: Prior to Admission medications   Medication Sig Start Date End Date Taking? Authorizing Provider    acetaminophen (TYLENOL) 500 MG tablet Take 1,000 mg by mouth every 6 (six) hours as needed for mild pain.   Yes [provider]  calcium carbonate (OS-CAL) 600 MG TABS tablet Take 1 tablet (600 mg total) by mouth daily with breakfast. + vitamin D 12/19/14  Yes Midge Minium, MD  Multiple Vitamins-Minerals (50+ ADULT EYE HEALTH PO) Take 1 capsule by mouth daily.    Yes [provider]  nortriptyline (PAMELOR) 50 MG capsule Take 1 capsule (50 mg total) by mouth at bedtime. Patient taking differently: Take 50 mg by mouth at bedtime as needed (sleep).  03/23/17  Yes Shelda Pal, DO  omeprazole (PRILOSEC) 20 MG capsule Take 1 capsule (20 mg total) by mouth daily. Patient taking differently: Take 20 mg by mouth daily as needed (heartburn).  06/17/16  Yes Colon Branch, MD     Family History  Problem Relation Age of Onset  . Breast cancer Mother   . Colon cancer Mother 4       died 30  . Aneurysm Father   . Heart attack Father        67  . Aneurysm Son   . Stroke Paternal Grandfather   . Cancer Paternal Grandmother   . Cancer Maternal Grandmother   . Prostate cancer Neg Hx   . Esophageal cancer Neg Hx   . Pancreatic cancer  Neg Hx   . Rectal cancer Neg Hx   . Stomach cancer Neg Hx     Social History   Social History  . Marital status: Married    Spouse name: Denice Paradise  . Number of children: 2  . Years of education: N/A   Occupational History  . RetiredSocial research officer, government    Social History Main Topics  . Smoking status: Former Smoker    Quit date: 04/19/1989  . Smokeless tobacco: Never Used  . Alcohol use 8.4 oz/week    14 Glasses of wine per week     Comment: most days  , amounts varies , sometimes > 2 servings   . Drug use: No  . Sexual activity: Not Asked   Other Topics Concern  . None   Social History Narrative   Household-- pt, wife (she has a post polio syndrome)   Lost oldest son ~ 2011   Moved from [place] ~ 2011   Youngest son lives in [place] reason why he moved here       Review of Systems denies fever, headache, chest pain, dyspnea, abdominal pain, nausea, vomiting or bleeding. He does have mid to low back pain as well as radiation of pain down right lower extremity with occasional paresthesias.  Vital Signs: BP 136/81 (BP Location: Left Arm)   Pulse 81   Temp 98.6 F (37 C) (Oral)   Resp 20   Ht 6\' 2"  (1.88 m)   Wt 215 lb (97.5 kg)   SpO2 92%   BMI 27.60 kg/m   Physical Exam awake, alert. Chest with distant but clear breath sounds bilaterally; heart with regular rate and rhythm. Abdomen soft, positive bowel sounds, nontender. Lower extremities with no edema. Paravertebral tenderness noted at L2 region. Sens/motor fxn ok.  Mallampati Score:     Imaging: [name]  Result Date: 06/09/2017 CLINICAL DATA:  Acute low back pain beginning 2 days ago after a fall. L2 compression fracture on prior imaging. Initial encounter. EXAM: MRI LUMBAR SPINE WITHOUT AND WITH CONTRAST TECHNIQUE: Multiplanar and multiecho pulse sequences of the lumbar spine were obtained without and with intravenous contrast. CONTRAST:  51mL MULTIHANCE GADOBENATE DIMEGLUMINE 529 MG/ML IV SOLN COMPARISON:  Lumbar spine CT 06/05/2017 FINDINGS: Segmentation:  Standard. Alignment: 4 mm anterolisthesis of L4 on L5, facet mediated. Trace retrolisthesis of L1 on L2, L2 on L3, and L3 on L4. Slight left convex curvature of the lumbar spine. Vertebrae: As described on recent CT, there is a mildly comminuted L2 vertebral body fracture with approximately 25% vertebral body height loss anteriorly. There is moderate vertebral marrow edema and enhancement mildly extending into the right pedicle. There is no retropulsion. Mixed degenerative Modic endplate changes are present at L5-S1 including prominent sclerosis. No suspicious osseous lesion is identified. Conus medullaris: Extends to the L1-2 level and appears normal. Paraspinal and other soft  tissues: Very mild edema in the paravertebral soft tissues at L1-2. Disc levels: Disc desiccation throughout the lumbar spine. Mild disc space narrowing at L3-4 with moderate narrowing at L4-5 and severe narrowing at L5-S1. L1-2:  Mild disc bulging without stenosis. L2-3: Minimal disc bulging and mild facet hypertrophy without stenosis. L3-4: Mild circumferential disc bulging and mild facet and ligamentum flavum hypertrophy result in borderline to mild bilateral lateral recess stenosis and borderline to mild right neural foraminal stenosis. No significant spinal stenosis. L4-5: Listhesis with mild bulging of uncovered disc, mild ligamentum flavum thickening, and advanced facet arthrosis result in  mild spinal stenosis, borderline to mild bilateral lateral recess stenosis, and borderline right neural foraminal stenosis. L5-S1: Circumferential disc bulging, endplate spurring, and mild left facet arthrosis result in mild left neural foraminal stenosis without spinal stenosis. IMPRESSION: 1. Acute L2 vertebral body fracture with 25% height loss. No retropulsion. 2. 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 3. Disc and facet degeneration elsewhere as above. Electronically Signed   By: Logan Bores M.D.   On: 06/09/2017 07:51    Labs:  CBC:  Recent Labs  10/14/16 1046 06/07/17 1554  WBC 6.9 8.4  HGB 14.4 13.8  HCT 43.0 40.6  PLT 175.0 136*    COAGS: No results for input(s): INR, APTT in the last 8760 hours.  BMP:  Recent Labs  10/14/16 1046 06/07/17 1554  NA 138 137  K 4.3 3.8  CL 103 103  CO2 30 25  GLUCOSE 104* 102*  BUN 15 15  CALCIUM 9.4 8.9  CREATININE 0.82 0.72  GFRNONAA  --  >60  GFRAA  --  >60    LIVER FUNCTION TESTS: No results for input(s): BILITOT, AST, ALT, ALKPHOS, PROT, ALBUMIN in the last 8760 hours.  TUMOR MARKERS: No results for input(s): AFPTM, CEA, CA199, CHROMGRNA in the last 8760 hours.  Assessment and Plan: 76 y.o.  male with remote history of rectal cancer 1994, hypertension, COPD who was seen recently at outside ED for acute low back pain following fall at home. CT scan at the time revealed L2 compression fracture. He was treated conservatively with narcotics, however failed to improve. He now has difficulty with ambulation secondary to the pain. Follow-up MRI on 06/09/17 revealed acute L2 vertebral body fracture with 25% height loss. No retropulsion. Advanced L4-5 facet arthrosis with grade 1 anteriolisthesis and mild spinal stenosis. Disc and facet degeneration also noted. Request now received from primary care service for L2 vertebroplasty/kyphoplasty.Imaging studies have been reviewed by Dr. Barbie Banner and patient appears to be a good candidate for L2 VP/KP. Risks and benefits discussed with the patient/wife including, but not limited to education regarding the natural healing process of compression fractures without intervention, bleeding, infection, cement migration which may cause spinal cord damage, paralysis, pulmonary embolism or even death.All of the patient's questions were answered, patient is agreeable to proceed.Consent signed and in chart.Awaiting insurance preauthorization for procedure but tent planned for 9/13.       Thank you for this interesting consult.  I greatly enjoyed meeting Devin [name] and look forward to participating in their care.  A copy of this report was sent to the requesting provider on this date.  Electronically Signed: D. Rowe Robert, PA-C 06/09/2017, 2:35 PM   I spent a total of 40 minutes  in face to face in clinical consultation, greater than 50% of which was counseling/coordinating care for L2 vertebroplasty/kyphoplasty

## 2017-06-09 NOTE — Progress Notes (Signed)
PROGRESS NOTE  Devin [name] BDZ:[id number] DOB: [birth year] DOA: 06/07/2017 PCP: Colon Branch, MD   LOS: 1 day   Brief Narrative / Interim history: Patient is 76 y.o. male who presented to ED 4 days ago for acute, severe low back pain following a fall at home.  CT lumbar spine revealed an L2 compression fracture and patient was sent home with oxycodone 325 mg and valium 5 mg.  He returned to the ED 2 days ago with severe pain that was not relieved by his pain medications, as well as being unable to ambulate.   Assessment & Plan: Active Problems:   Compression fracture of L2 (HCC)   Compression fracture of L2 - Patient still unable to ambulate and is bedbound - Radiology has been consulted and patient was deemed a good candidate for kyphoplasty/vertebroplasty, awaiting insurance preauthorization - PT has seen patient and will follow - Continue Tramadol 100 mg PO Q12H, Dilaudid 1 mg IV Q3H PRN, and Flexeril 5 mg PO Q8H PRN for pain management  History of Depression - Continue Nortriptyline 50 mg at bedtime PRN for sleep  Hypertension - Currently controlled without medications  GERD - Continue Protonix 40 mg QD  Constipation - Likely in the setting of narcotic pain medications, decreased PO intake, and severe pain. - Patient is not symptomatic at this time and requests that we wait to address this after his IR procedure if approved by insurance.   DVT prophylaxis: Lovenox 40 mg subcutaneous Code Status: FULL Family Communication: No family at bedside; Reassured pt that wife can be contacted prior to making decisions concerning his care Disposition Plan: Home vs SNF  Consultants:   IR  PT    Subjective: Patient reports his pain is well-controlled in the hospital he just experiences some achy pain at times when lying still.  When he moves, he still experiences sharp low back pain which he rates a 9/10 so he is avoiding movement as much as possible. He denies nausea, vomiting,  stomach pain, and diarrhea.  He has not had a bowel movement since last Tuesday. He fears he will not be able to position himself to have a bowel movement without experiencing severe pain. He would like to wait to address this until after his IR procedure if possible.   Objective: Vitals:   06/08/17 0418 06/08/17 1340 06/08/17 2116 06/09/17 0435  BP: 132/76 (!) 146/85 131/69 132/71  Pulse: 90 87 80 86  Resp: 19 18 18 18   Temp: 98 F (36.7 C) 98.2 F (36.8 C) 97.6 F (36.4 C) (!) 97.5 F (36.4 C)  TempSrc: Oral Oral Oral Oral  SpO2: 93% 92% 92% 91%  Weight:      Height:        Intake/Output Summary (Last 24 hours) at 06/09/17 1017 Last data filed at 06/09/17 0851  Gross per 24 hour  Intake              840 ml  Output             1175 ml  Net             -335 ml   Filed Weights   06/07/17 1307  Weight: 97.5 kg (215 lb)    Examination:  Vitals:   06/08/17 0418 06/08/17 1340 06/08/17 2116 06/09/17 0435  BP: 132/76 (!) 146/85 131/69 132/71  Pulse: 90 87 80 86  Resp: 19 18 18 18   Temp: 98 F (36.7 C) 98.2 F (36.8 C) 97.6  F (36.4 C) (!) 97.5 F (36.4 C)  TempSrc: Oral Oral Oral Oral  SpO2: 93% 92% 92% 91%  Weight:      Height:       Constitutional: Lying supine in bed, no acute distress. Cardiac: Regular rate, no lower extremity edema Respiratory: No accessory muscle use, No respiratory distress or cyanosis Psychiatric: Pleasant affect, thought patterns organized, speech non-pressured, alert and oriented X3   Data Reviewed: I have independently reviewed following labs and imaging studies  CBC:  Recent Labs Lab 06/07/17 1554  WBC 8.4  HGB 13.8  HCT 40.6  MCV 91.2  PLT 696*   Basic Metabolic Panel:  Recent Labs Lab 06/07/17 1554  NA 137  K 3.8  CL 103  CO2 25  GLUCOSE 102*  BUN 15  CREATININE 0.72  CALCIUM 8.9   GFR: Estimated Creatinine Clearance: 92.8 mL/min (by C-G formula based on SCr of 0.72 mg/dL). Liver Function Tests: No results for  input(s): AST, ALT, ALKPHOS, BILITOT, PROT, ALBUMIN in the last 168 hours. No results for input(s): LIPASE, AMYLASE in the last 168 hours. No results for input(s): AMMONIA in the last 168 hours. Coagulation Profile: No results for input(s): INR, PROTIME in the last 168 hours. Cardiac Enzymes:  Recent Labs Lab 06/07/17 1554  CKTOTAL 99   BNP (last 3 results) No results for input(s): PROBNP in the last 8760 hours. HbA1C: No results for input(s): HGBA1C in the last 72 hours. CBG: No results for input(s): GLUCAP in the last 168 hours. Lipid Profile: No results for input(s): CHOL, HDL, LDLCALC, TRIG, CHOLHDL, LDLDIRECT in the last 72 hours. Thyroid Function Tests: No results for input(s): TSH, T4TOTAL, FREET4, T3FREE, THYROIDAB in the last 72 hours. Anemia Panel: No results for input(s): VITAMINB12, FOLATE, FERRITIN, TIBC, IRON, RETICCTPCT in the last 72 hours. Urine analysis:    Component Value Date/Time   COLORURINE YELLOW 06/07/2017 1414   APPEARANCEUR HAZY (A) 06/07/2017 1414   LABSPEC 1.028 06/07/2017 1414   PHURINE 5.0 06/07/2017 1414   GLUCOSEU NEGATIVE 06/07/2017 1414   HGBUR NEGATIVE 06/07/2017 1414   BILIRUBINUR NEGATIVE 06/07/2017 1414   KETONESUR 80 (A) 06/07/2017 1414   PROTEINUR 30 (A) 06/07/2017 1414   NITRITE NEGATIVE 06/07/2017 1414   LEUKOCYTESUR TRACE (A) 06/07/2017 1414   Sepsis Labs: Invalid input(s): PROCALCITONIN, LACTICIDVEN  Recent Results (from the past 240 hour(s))  Culture, Urine     Status: None   Collection Time: 06/07/17  2:14 PM  Result Value Ref Range Status   Specimen Description URINE, RANDOM  Final   Special Requests NONE  Final   Culture   Final    NO GROWTH Performed at [facility], [address], [place], [place] [address]    Report Status 06/09/2017 FINAL  Final      Radiology Studies: [name]  Result Date: 06/09/2017 CLINICAL DATA:  Acute low back pain beginning 2 days ago after a fall. L2  compression fracture on prior imaging. Initial encounter. EXAM: MRI LUMBAR SPINE WITHOUT AND WITH CONTRAST TECHNIQUE: Multiplanar and multiecho pulse sequences of the lumbar spine were obtained without and with intravenous contrast. CONTRAST:  51mL MULTIHANCE GADOBENATE DIMEGLUMINE 529 MG/ML IV SOLN COMPARISON:  Lumbar spine CT 06/05/2017 FINDINGS: Segmentation:  Standard. Alignment: 4 mm anterolisthesis of L4 on L5, facet mediated. Trace retrolisthesis of L1 on L2, L2 on L3, and L3 on L4. Slight left convex curvature of the lumbar spine. Vertebrae: As described on recent CT, there  is a mildly comminuted L2 vertebral body fracture with approximately 25% vertebral body height loss anteriorly. There is moderate vertebral marrow edema and enhancement mildly extending into the right pedicle. There is no retropulsion. Mixed degenerative Modic endplate changes are present at L5-S1 including prominent sclerosis. No suspicious osseous lesion is identified. Conus medullaris: Extends to the L1-2 level and appears normal. Paraspinal and other soft tissues: Very mild edema in the paravertebral soft tissues at L1-2. Disc levels: Disc desiccation throughout the lumbar spine. Mild disc space narrowing at L3-4 with moderate narrowing at L4-5 and severe narrowing at L5-S1. L1-2:  Mild disc bulging without stenosis. L2-3: Minimal disc bulging and mild facet hypertrophy without stenosis. L3-4: Mild circumferential disc bulging and mild facet and ligamentum flavum hypertrophy result in borderline to mild bilateral lateral recess stenosis and borderline to mild right neural foraminal stenosis. No significant spinal stenosis. L4-5: Listhesis with mild bulging of uncovered disc, mild ligamentum flavum thickening, and advanced facet arthrosis result in mild spinal stenosis, borderline to mild bilateral lateral recess stenosis, and borderline right neural foraminal stenosis. L5-S1: Circumferential disc bulging, endplate spurring, and mild  left facet arthrosis result in mild left neural foraminal stenosis without spinal stenosis. IMPRESSION: 1. Acute L2 vertebral body fracture with 25% height loss. No retropulsion. 2. 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 3. Disc and facet degeneration elsewhere as above. Electronically Signed   By: Logan Bores M.D.   On: 06/09/2017 07:51     Scheduled Meds: . enoxaparin (LOVENOX) injection  40 mg Subcutaneous Q24H  . pantoprazole  40 mg Oral Daily  . traMADol  100 mg Oral Q12H      Arlana Lindau, PA-Student [facility]  06/09/2017, 10:17 AM   [email or web address] @

## 2017-06-09 NOTE — Clinical Social Work Note (Signed)
Clinical Social Work Assessment  Patient Details  Name: Devin [name] MRN: [id number] Date of Birth: [birth year]  Date of referral:  06/09/17               Reason for consult:  Discharge Planning                Permission sought to share information with:    Permission granted to share information::     Name::        Agency::     Relationship::     Contact Information:     Housing/Transportation Living arrangements for the past 2 months:  Single Family Home Source of Information:  Patient Patient Interpreter Needed:  None Criminal Activity/Legal Involvement Pertinent to Current Situation/Hospitalization:  No - Comment as needed Significant Relationships:  Spouse Lives with:  Spouse Do you feel safe going back to the place where you live?   (Depends on medical status.) Need for family participation in patient care:  No (Coment)  Care giving concerns:  Pt needs more care than available at home.   Social Worker assessment / plan: Pt hospitalized from home on 06/07/17 with acute back pain after falling at home. Pt has been dx with Acute compression L2 fracture with severe back pain and sciatic pain that is chronic. PN indicate request made for VP/KP with pre authorization pending. PT recommends SNF at dc. CSW met with pt at bedside to assist with dc planning. Pt is hoping to return home at dc if his pain is under control and he is close to his functioning baseline. Pt will consider SNF if goals are not met. Pt has [employer] which requires prior authorization for SNF placement. CSW will initiate SNF search in case SNF is required. CSW will continue to follow to assist with dc planning needs.  Employment status:  Retired Nurse, adult PT Recommendations:  [place] / Referral to community resources:  [place]  Patient/Family's Response to care:  Disposition to be determined.  Patient/Family's Understanding of and  Emotional Response to Diagnosis, Current Treatment, and Prognosis: Pt is aware of his medical condition. " I feel miserable." Support provided.  Emotional Assessment Appearance:  Appears stated age Attitude/Demeanor/Rapport:  Other (cooperative) Affect (typically observed):  Appropriate, Pleasant Orientation:  Oriented to Self, Oriented to Place, Oriented to  Time, Oriented to Situation Alcohol / Substance use:    Psych involvement (Current and /or in the community):  No (Comment)  Discharge Needs  Concerns to be addressed:  Discharge Planning Concerns Readmission within the last 30 days:  No Current discharge risk:  None Barriers to Discharge:  No Barriers Identified   Luretha Rued, [place] 06/09/2017, 9:24 AM

## 2017-06-10 LAB — CBC
HEMATOCRIT: 39.2 % (ref 39.0–52.0)
Hemoglobin: 13.6 g/dL (ref 13.0–17.0)
MCH: 31.3 pg (ref 26.0–34.0)
MCHC: 34.7 g/dL (ref 30.0–36.0)
MCV: 90.3 fL (ref 78.0–100.0)
Platelets: 159 10*3/uL (ref 150–400)
RBC: 4.34 MIL/uL (ref 4.22–5.81)
RDW: 12.7 % (ref 11.5–15.5)
WBC: 6.9 10*3/uL (ref 4.0–10.5)

## 2017-06-10 LAB — BASIC METABOLIC PANEL
ANION GAP: 9 (ref 5–15)
BUN: 17 mg/dL (ref 6–20)
CHLORIDE: 101 mmol/L (ref 101–111)
CO2: 27 mmol/L (ref 22–32)
Calcium: 9 mg/dL (ref 8.9–10.3)
Creatinine, Ser: 0.78 mg/dL (ref 0.61–1.24)
Glucose, Bld: 113 mg/dL — ABNORMAL HIGH (ref 65–99)
POTASSIUM: 3.8 mmol/L (ref 3.5–5.1)
SODIUM: 137 mmol/L (ref 135–145)

## 2017-06-10 MED ORDER — POLYETHYLENE GLYCOL 3350 17 G PO PACK
17.0000 g | PACK | Freq: Two times a day (BID) | ORAL | Status: AC
  Administered 2017-06-10 – 2017-06-11 (×4): 17 g via ORAL
  Filled 2017-06-10 (×4): qty 1

## 2017-06-10 NOTE — Progress Notes (Signed)
[facility] PROGRESS NOTE    Progress Note  Devin [name]  VVO:[id number] DOB: 11/16/1940 DOA: 06/07/2017 PCP: Colon Branch, MD     Brief Narrative:   Devin [name] is an 76 y.o. male past medical history of acute back pain that started 2 days prior to admission, she relates she slipped early in the morning that day  Assessment/Plan:   Active Problems:   Compression fracture of L2 ([place]) IR was consulted who recommended kyphoplasty likely to happen on 06/11/2017. Physical therapy was consulted recommended skilled nursing facility. Continue symptomatic management. Awaiting CT scan of the head.  Constipation: Start MiraLAX twice a day and he'll see his on narcotics.  Essential hypertension: Pressure seems to be controlled off antihypertensive medication.   DVT prophylaxis: lovenox Family Communication:none Disposition Plan/Barrier to D/C: home in friday Code Status:     Code Status Orders        Start     Ordered   06/07/17 1905  Full code  Continuous     06/07/17 1906    Code Status History    Date Active Date Inactive Code Status Order ID Comments User Context   This patient has a current code status but no historical code status.    Advance Directive Documentation     Most Recent Value  Type of Advance Directive  Healthcare Power of Attorney, Living will  Pre-existing out of facility DNR order (yellow form or pink MOST form)  -  "MOST" Form in Place?  -        IV Access:    Peripheral IV   Procedures and diagnostic studies:   [name]  Result Date: 06/09/2017 CLINICAL DATA:  Acute low back pain beginning 2 days ago after a fall. L2 compression fracture on prior imaging. Initial encounter. EXAM: MRI LUMBAR SPINE WITHOUT AND WITH CONTRAST TECHNIQUE: Multiplanar and multiecho pulse sequences of the lumbar spine were obtained without and with intravenous contrast. CONTRAST:  78mL MULTIHANCE GADOBENATE DIMEGLUMINE 529 MG/ML IV SOLN  COMPARISON:  Lumbar spine CT 06/05/2017 FINDINGS: Segmentation:  Standard. Alignment: 4 mm anterolisthesis of L4 on L5, facet mediated. Trace retrolisthesis of L1 on L2, L2 on L3, and L3 on L4. Slight left convex curvature of the lumbar spine. Vertebrae: As described on recent CT, there is a mildly comminuted L2 vertebral body fracture with approximately 25% vertebral body height loss anteriorly. There is moderate vertebral marrow edema and enhancement mildly extending into the right pedicle. There is no retropulsion. Mixed degenerative Modic endplate changes are present at L5-S1 including prominent sclerosis. No suspicious osseous lesion is identified. Conus medullaris: Extends to the L1-2 level and appears normal. Paraspinal and other soft tissues: Very mild edema in the paravertebral soft tissues at L1-2. Disc levels: Disc desiccation throughout the lumbar spine. Mild disc space narrowing at L3-4 with moderate narrowing at L4-5 and severe narrowing at L5-S1. L1-2:  Mild disc bulging without stenosis. L2-3: Minimal disc bulging and mild facet hypertrophy without stenosis. L3-4: Mild circumferential disc bulging and mild facet and ligamentum flavum hypertrophy result in borderline to mild bilateral lateral recess stenosis and borderline to mild right neural foraminal stenosis. No significant spinal stenosis. L4-5: Listhesis with mild bulging of uncovered disc, mild ligamentum flavum thickening, and advanced facet arthrosis result in mild spinal stenosis, borderline to mild bilateral lateral recess stenosis, and borderline right neural foraminal stenosis. L5-S1: Circumferential disc bulging, endplate spurring, and mild left facet arthrosis result in mild left neural  foraminal stenosis without spinal stenosis. IMPRESSION: 1. Acute L2 vertebral body fracture with 25% height loss. No retropulsion. 2. 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 3. Disc and facet degeneration  elsewhere as above. Electronically Signed   By: Devin [name] M.D.   On: 06/09/2017 07:51     Medical Consultants:    None.  Anti-Infectives:     Subjective:    [name] no new complaints.  Objective:    Vitals:   06/09/17 0435 06/09/17 1318 06/09/17 2132 06/10/17 0628  BP: 132/71 136/81 131/79 129/63  Pulse: 86 81 90 81  Resp: 18 20 20 18   Temp: (!) 97.5 F (36.4 C) 98.6 F (37 C) 98.3 F (36.8 C) 97.8 F (36.6 C)  TempSrc: Oral Oral    SpO2: 91% 92% 92% 91%  Weight:      Height:        Intake/Output Summary (Last 24 hours) at 06/10/17 1131 Last data filed at 06/10/17 0800  Gross per 24 hour  Intake              480 ml  Output              400 ml  Net               80 ml   Filed Weights   06/07/17 1307  Weight: 97.5 kg (215 lb)    Exam: General exam: In no acute distress. Respiratory system: Good air movement and clear to auscultation. Cardiovascular system: S1 & S2 heard, RRR. No JVD, murmurs, rubs, gallops or clicks.  Gastrointestinal system: Abdomen is nondistended, soft and nontender.  Central nervous system: Alert and oriented. No focal neurological deficits. Extremities: No pedal edema. Skin: No rashes, lesions or ulcers Psychiatry: Judgement and insight appear normal. Mood & affect appropriate.    Data Reviewed:    Labs: Basic Metabolic Panel:  Recent Labs Lab 06/07/17 1554 06/10/17 0539  NA 137 137  K 3.8 3.8  CL 103 101  CO2 25 27  GLUCOSE 102* 113*  BUN 15 17  CREATININE 0.72 0.78  CALCIUM 8.9 9.0   GFR Estimated Creatinine Clearance: 92.8 mL/min (by C-G formula based on SCr of 0.78 mg/dL). Liver Function Tests: No results for input(s): AST, ALT, ALKPHOS, BILITOT, PROT, ALBUMIN in the last 168 hours. No results for input(s): LIPASE, AMYLASE in the last 168 hours. No results for input(s): AMMONIA in the last 168 hours. Coagulation profile No results for input(s): INR, PROTIME in the last 168 hours.  CBC:  Recent  Labs Lab 06/07/17 1554 06/10/17 0539  WBC 8.4 6.9  HGB 13.8 13.6  HCT 40.6 39.2  MCV 91.2 90.3  PLT 136* 159   Cardiac Enzymes:  Recent Labs Lab 06/07/17 1554  CKTOTAL 99   BNP (last 3 results) No results for input(s): PROBNP in the last 8760 hours. CBG: No results for input(s): GLUCAP in the last 168 hours. D-Dimer: No results for input(s): DDIMER in the last 72 hours. Hgb A1c: No results for input(s): HGBA1C in the last 72 hours. Lipid Profile: No results for input(s): CHOL, HDL, LDLCALC, TRIG, CHOLHDL, LDLDIRECT in the last 72 hours. Thyroid function studies: No results for input(s): TSH, T4TOTAL, T3FREE, THYROIDAB in the last 72 hours.  Invalid input(s): FREET3 Anemia work up: No results for input(s): VITAMINB12, FOLATE, FERRITIN, TIBC, IRON, RETICCTPCT in the last 72 hours. Sepsis Labs:  Recent Labs Lab 06/07/17 1554 06/10/17 0539  WBC  8.4 6.9   Microbiology Recent Results (from the past 240 hour(s))  Culture, Urine     Status: None   Collection Time: 06/07/17  2:14 PM  Result Value Ref Range Status   Specimen Description URINE, RANDOM  Final   Special Requests NONE  Final   Culture   Final    NO GROWTH Performed at [facility], [address] [place], [place] [address]    Report Status 06/09/2017 FINAL  Final     Medications:   . enoxaparin (LOVENOX) injection  40 mg Subcutaneous Q24H  . pantoprazole  40 mg Oral Daily  . traMADol  100 mg Oral Q12H   Continuous Infusions: .  ceFAZolin (ANCEF) IV      LOS: 2 days   Charlynne Cousins  [facility] Pager [phone]  *Please refer to [email or web address], password [email or web address] to get updated schedule on who will round on this patient, as hospitalists switch teams weekly. If 7PM-7AM, please contact night-coverage at [email or web address], password [email or web address] for any overnight needs.  06/10/2017, 11:31 AM

## 2017-06-10 NOTE — Progress Notes (Signed)
PT Cancellation Note  Patient Details Name: Devin [name] MRN: [id number] DOB: 07/09/1941   Cancelled Treatment:     awaiting Kyphoplasty Sep 13.  Pt has been evaluated with rec for SNF.  Will check back after procedure.     Rica Koyanagi  PTA [facility]  Acute  Rehab Pager      [phone]

## 2017-06-11 ENCOUNTER — Inpatient Hospital Stay (HOSPITAL_COMMUNITY): Payer: Medicare HMO

## 2017-06-11 LAB — CBC WITH DIFFERENTIAL/PLATELET
BASOS ABS: 0 10*3/uL (ref 0.0–0.1)
BASOS PCT: 0 %
EOS ABS: 0.2 10*3/uL (ref 0.0–0.7)
Eosinophils Relative: 2 %
HCT: 40.1 % (ref 39.0–52.0)
HEMOGLOBIN: 13.9 g/dL (ref 13.0–17.0)
Lymphocytes Relative: 19 %
Lymphs Abs: 1.5 10*3/uL (ref 0.7–4.0)
MCH: 31.2 pg (ref 26.0–34.0)
MCHC: 34.7 g/dL (ref 30.0–36.0)
MCV: 90.1 fL (ref 78.0–100.0)
Monocytes Absolute: 1.1 10*3/uL — ABNORMAL HIGH (ref 0.1–1.0)
Monocytes Relative: 15 %
NEUTROS ABS: 5 10*3/uL (ref 1.7–7.7)
NEUTROS PCT: 64 %
Platelets: 173 10*3/uL (ref 150–400)
RBC: 4.45 MIL/uL (ref 4.22–5.81)
RDW: 12.6 % (ref 11.5–15.5)
WBC: 7.9 10*3/uL (ref 4.0–10.5)

## 2017-06-11 LAB — BASIC METABOLIC PANEL
ANION GAP: 10 (ref 5–15)
BUN: 15 mg/dL (ref 6–20)
CALCIUM: 9.1 mg/dL (ref 8.9–10.3)
CO2: 27 mmol/L (ref 22–32)
Chloride: 99 mmol/L — ABNORMAL LOW (ref 101–111)
Creatinine, Ser: 0.77 mg/dL (ref 0.61–1.24)
GFR calc Af Amer: >60 mL/min (ref >60)
GLUCOSE: 119 mg/dL — AB (ref 65–99)
Potassium: 3.9 mmol/L (ref 3.5–5.1)
Sodium: 136 mmol/L (ref 135–145)

## 2017-06-11 LAB — PROTIME-INR
INR: 0.96
PROTHROMBIN TIME: 12.7 s (ref 11.4–15.2)

## 2017-06-11 IMAGING — XA IR VERTEBROPLASTY LUMBOSACRAL INJ
1 series · 11 of 11 positions shown · non-contrast
Comparison: CT scan of the lumbar spine 06/05/2017;

CLINICAL DATA: 76-year-old male with highly symptomatic acute L2
compression fracture. He presents for cement augmentation.

EXAM:
FLUOROSCOPIC GUIDED KYPHOPLASTY OF THE L2 VERTEBRAL BODY
TECHNIQUE: The procedure, risks (including but not limited to bleeding,
infection, organ damage), benefits, and alternatives were explained
to the patient. Questions regarding the procedure were encouraged
and answered. The patient understands and consents to the procedure.

[Series 300: ir vertebroplasty lumbar bx inc uni/bil  · 11 of 11 slices shown]
[im 1/11]
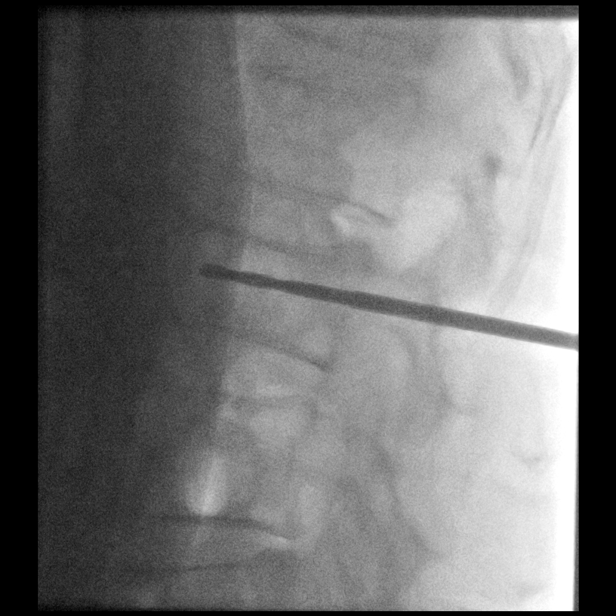
[im 2/11]
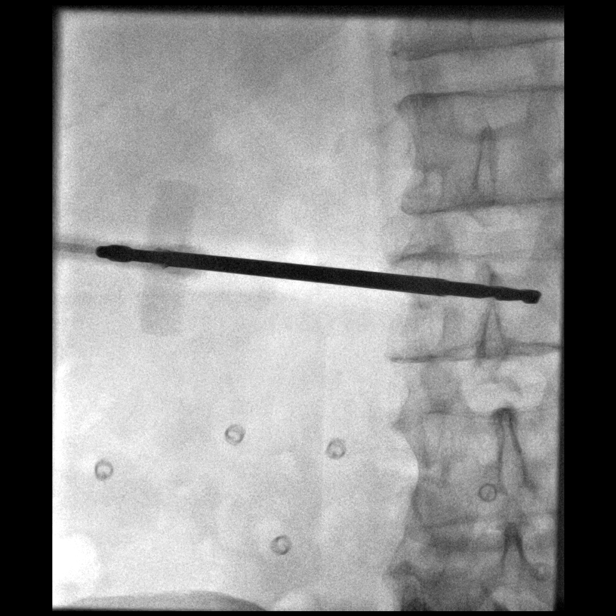
[im 3/11]
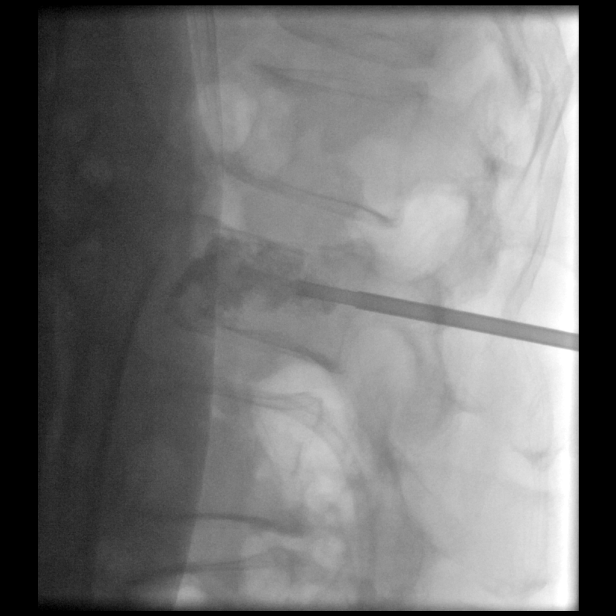
[im 4/11]
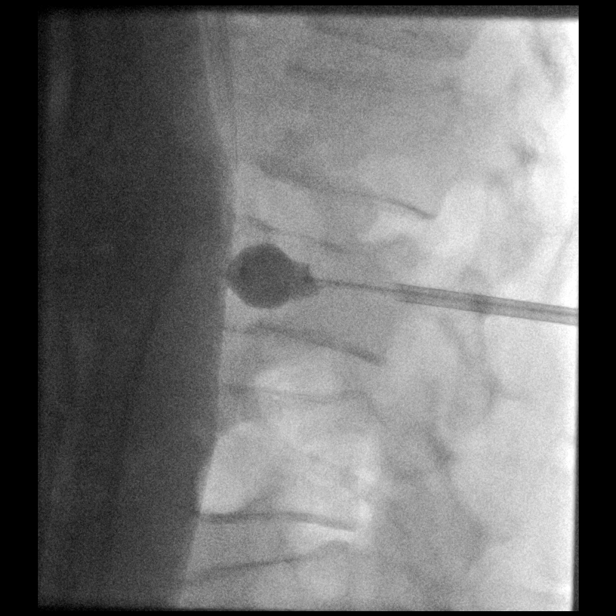
[im 5/11]
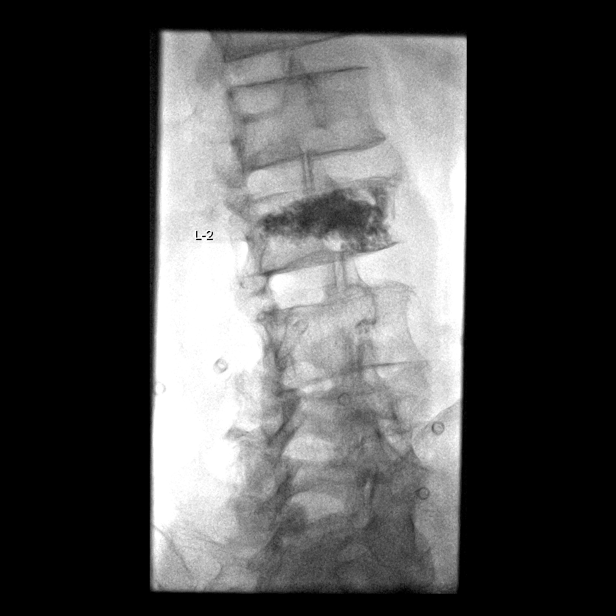
[im 6/11]
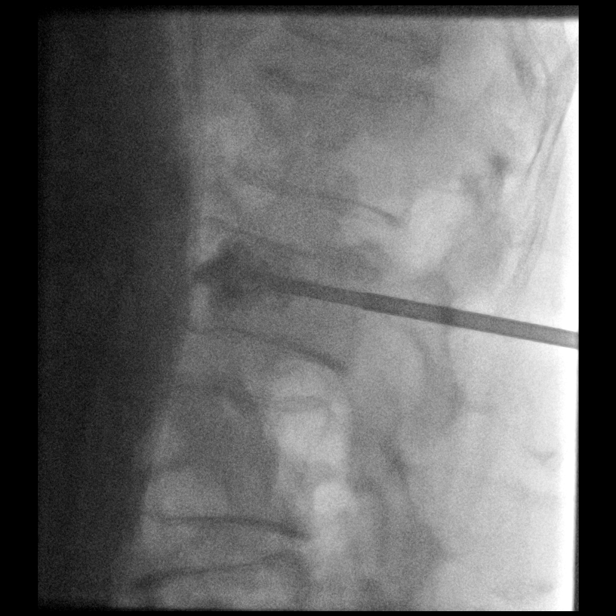
[im 7/11]
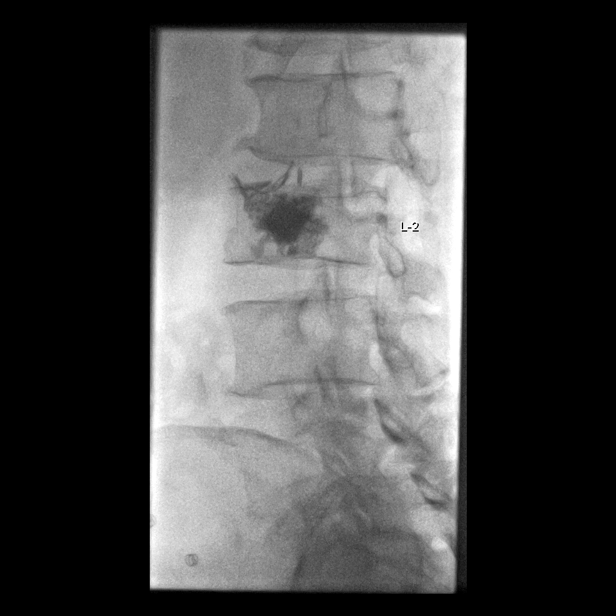
[im 8/11]
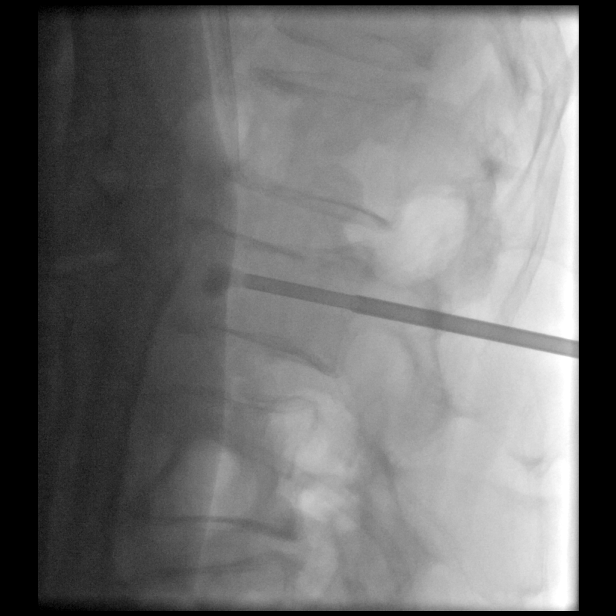
[im 9/11]
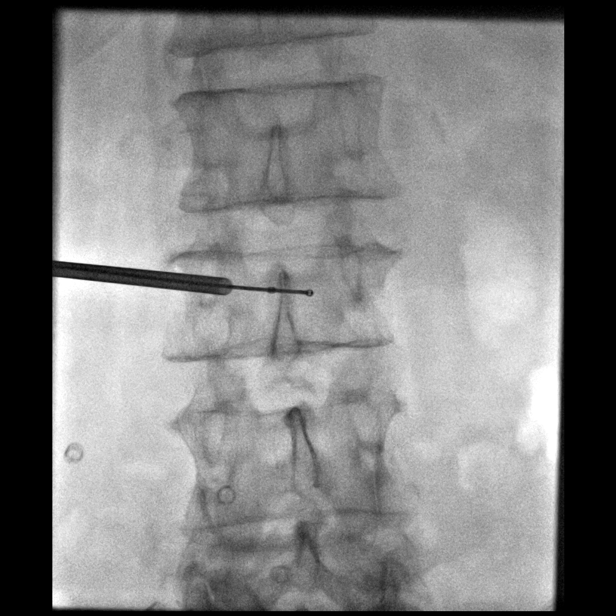
[im 10/11]
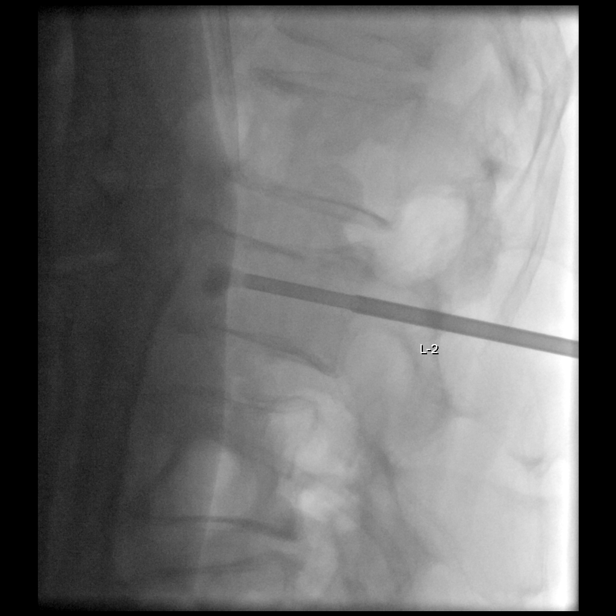
[im 11/11]
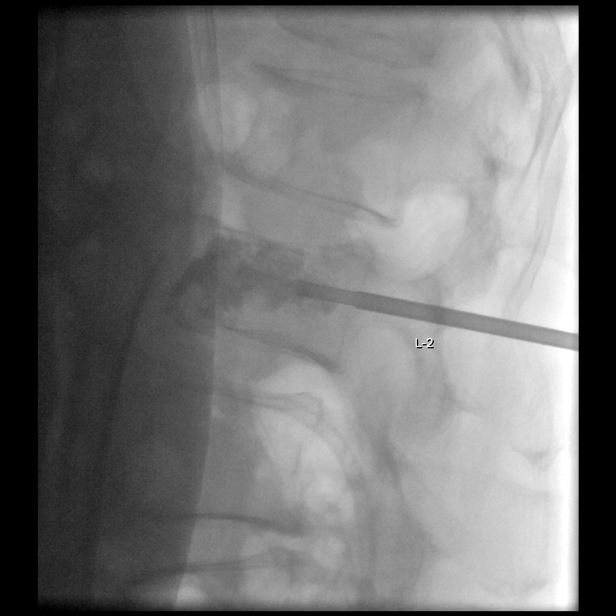

[11 of 11 positions shown; findings below may reference images not displayed]

MRI of the
lumbar spine 06/09/2017

MEDICATIONS:
As antibiotic prophylaxis, 2 g Ancef was ordered pre-procedure and
administered intravenously within 1 hour of incision.

ANESTHESIA/SEDATION:
Moderate (conscious) sedation was employed during this procedure. A
total of Versed Three mg and Fentanyl 100 mcg was administered
intravenously.

Moderate Sedation Time: 30 minutes minutes. The patient's level of
consciousness and vital signs were monitored continuously by
radiology nursing throughout the procedure under my direct
supervision.

FLUOROSCOPY TIME:  6 minutes, 0 seconds (440 mGy)

COMPLICATIONS:
None immediate.
The patient was placed prone on the fluoroscopic table. The skin
overlying the lumbar region was then prepped and draped in the usual
sterile fashion. Maximal barrier sterile technique was utilized
including caps, mask, sterile gowns, sterile gloves, sterile drape,
hand hygiene and skin antiseptic. Intravenous Fentanyl and Versed
were administered as conscious sedation during continuous
cardiorespiratory monitoring by the radiology RN.

The left pedicle at L2 was then infiltrated with 1% lidocaine
followed by the advancement of a Kyphon trocar needle through the
left pedicle into the posterior one-third of the vertebral body.
Subsequently, the osteo drill was advanced to the anterior third of
the vertebral body. The osteo drill was retracted. Through the
working cannula, a Kyphon inflatable bone tamp 20 x 3 was advanced
and positioned with the distal marker approximately 5 mm from the
anterior aspect of the cortex. Appropriate positioning was confirmed
on the AP projection. At this time, the balloon was expanded using
contrast via a Kyphon inflation syringe device via micro tubing.

Inflations were continued until there was near apposition with the
superior end plate. At this time, methylmethacrylate mixture was
reconstituted in the Kyphon bone mixing device system. This was then
loaded into the delivery mechanism, attached to Kyphon bone fillers.

The balloons were deflated and removed followed by the instillation
of methylmethacrylate mixture with excellent filling in the AP and
lateral projections. No extravasation was noted in the disk spaces
or posteriorly into the spinal canal. No epidural venous
contamination was seen.

The working cannulae and the bone filler were then retrieved and
removed. Hemostasis was achieved with manual compression. The
patient tolerated the procedure well without immediate
postprocedural complication.
IMPRESSION: 1. Technically successful L2 vertebral body augmentation using
balloon kyphoplasty.
2. Per CMS PQRS reporting requirements (PQRS Measure 24): Given the
patient's age of greater than 50 and the fracture site (hip, distal
radius, or spine), the patient should be tested for osteoporosis
using DXA, and the appropriate treatment considered based on the DXA
results.

## 2017-06-11 MED ORDER — DIPHENHYDRAMINE HCL 50 MG/ML IJ SOLN
12.5000 mg | Freq: Three times a day (TID) | INTRAMUSCULAR | Status: DC | PRN
  Administered 2017-06-11 – 2017-06-12 (×3): 12.5 mg via INTRAVENOUS
  Filled 2017-06-11 (×3): qty 1

## 2017-06-11 MED ORDER — CEFAZOLIN SODIUM-DEXTROSE 2-4 GM/100ML-% IV SOLN
2.0000 g | INTRAVENOUS | Status: AC
  Administered 2017-06-12: 2 g via INTRAVENOUS

## 2017-06-11 MED ORDER — FENTANYL CITRATE (PF) 100 MCG/2ML IJ SOLN
INTRAMUSCULAR | Status: AC
  Filled 2017-06-11: qty 4

## 2017-06-11 MED ORDER — LIDOCAINE HCL (PF) 2 % IJ SOLN
INTRAMUSCULAR | Status: AC
  Filled 2017-06-11: qty 10

## 2017-06-11 MED ORDER — IOPAMIDOL (ISOVUE-300) INJECTION 61%
INTRAVENOUS | Status: AC
  Filled 2017-06-11: qty 50

## 2017-06-11 MED ORDER — MIDAZOLAM HCL 2 MG/2ML IJ SOLN
INTRAMUSCULAR | Status: AC
  Filled 2017-06-11: qty 6

## 2017-06-11 NOTE — NC FL2 (Signed)
[place] LEVEL OF CARE SCREENING TOOL     IDENTIFICATION  Patient Name: Devin [name] Birthdate: 07/30/1941 Sex: male Admission Date (Current Location): 06/07/2017  [facility] and [place] Number:  Herbalist and Address:  [facility],  [name], [name]      Provider Number: [id number]  Attending Physician Name and Address:  Charlynne Cousins, MD  Relative Name and Phone Number:       Current Level of Care: Hospital Recommended Level of Care: [name] Prior Approval Number:    Date Approved/Denied:   PASRR Number:    Discharge Plan: SNF    Current Diagnoses: Patient Active Problem List   Diagnosis Date Noted  . Compression fracture of L2 ([place]) 06/07/2017  . Insomnia 10/14/2016  . DJD (degenerative joint disease) 10/14/2016  . PCP NOTES >>>>>>>>>>>>>>>>>>>>>>>>> 11/28/2015  . Benign prostatic hyperplasia with urinary obstruction 05/30/2015  . Cancer ([place]) 05/30/2015  . Fungal dermatitis 02/23/2015  . Annual physical exam 11/27/2014  . Essential hypertension 10/02/2014  . Alcohol use disorder, mild, abuse 10/02/2014  . Bruit 08/09/2014  . Medication management 07/27/2014  . GERD (gastroesophageal reflux disease) 05/25/2014  . History of rectal cancer 05/15/2014  . Personal history of colonic polyps 05/15/2014  . Diverticulosis of colon (without mention of hemorrhage) 05/15/2014  . Family history of malignant neoplasm of gastrointestinal tract 05/15/2014  . Depression 04/19/2014    Orientation RESPIRATION BLADDER Height & Weight     Self, Time, Situation, Place  Normal Continent Weight: 97.5 kg (215 lb) Height:  6\' 2"  (188 cm)  BEHAVIORAL SYMPTOMS/MOOD NEUROLOGICAL BOWEL NUTRITION STATUS  Other (Comment) (no behaviors)   Continent Diet  AMBULATORY STATUS COMMUNICATION OF NEEDS Skin   Extensive Assist Verbally Normal                       Personal Care Assistance Level of Assistance   Bathing, Feeding, Dressing Bathing Assistance: Maximum assistance Feeding assistance: Independent Dressing Assistance: Maximum assistance     Functional Limitations Info  Sight, Hearing, Speech Sight Info: Adequate Hearing Info: Adequate Speech Info: Adequate    SPECIAL CARE FACTORS FREQUENCY  PT (By licensed PT), OT (By licensed OT)     PT Frequency: 5x wk OT Frequency: 5x wk            Contractures Contractures Info: Not present    Additional Factors Info  Code Status, Allergies Code Status Info: Full Code Allergies Info: NKA           Current Medications (06/11/2017):  This is the current hospital active medication list Current Facility-Administered Medications  Medication Dose Route Frequency Provider Last Rate Last Dose  . bisacodyl (DULCOLAX) EC tablet 5 mg  5 mg Oral Daily PRN Gennaro Africa, MD   5 mg at 06/08/17 2153  . ceFAZolin (ANCEF) IVPB 2g/100 mL premix  2 g Intravenous to XRAY Allred, Darrell K, PA-C      . cyclobenzaprine (FLEXERIL) tablet 5 mg  5 mg Oral TID PRN Caren Griffins, MD   5 mg at 06/11/17 0042  . diphenhydrAMINE (BENADRYL) injection 12.5 mg  12.5 mg Intravenous Q8H PRN Opyd, Ilene Qua, MD   12.5 mg at 06/11/17 0134  . enoxaparin (LOVENOX) injection 40 mg  40 mg Subcutaneous Q24H Gennaro Africa, MD   40 mg at 06/10/17 2204  . HYDROmorphone (DILAUDID) injection 1 mg  1 mg Intravenous Q3H PRN Gennaro Africa, MD  1 mg at 06/11/17 0858  . nortriptyline (PAMELOR) capsule 50 mg  50 mg Oral QHS PRN Gennaro Africa, MD      . pantoprazole (PROTONIX) EC tablet 40 mg  40 mg Oral Daily Gennaro Africa, MD   40 mg at 06/10/17 1019  . polyethylene glycol (MIRALAX / GLYCOLAX) packet 17 g  17 g Oral BID Charlynne Cousins, MD   17 g at 06/10/17 2204  . traMADol (ULTRAM) tablet 100 mg  100 mg Oral Q12H Gennaro Africa, MD   100 mg at 06/10/17 2203     Discharge Medications: Please see discharge summary for a list of discharge medications.  Relevant Imaging  Results:  Relevant Lab Results:   Additional Information SS # [id number]  Ellsworth Waldschmidt, Randall An, LCSW

## 2017-06-11 NOTE — Care Management Important Message (Signed)
Important Message  Patient Details  Name: Devin [name] MRN: [id number] Date of Birth: 27-Sep-1941   Medicare Important Message Given:  Yes    Kerin Salen 06/11/2017, 10:58 AMImportant Message  Patient Details  Name: Devin [name] MRN: [id number] Date of Birth: [birth year]   Medicare Important Message Given:  Yes    Kerin Salen 06/11/2017, 10:58 AM

## 2017-06-11 NOTE — Progress Notes (Signed)
Patient ID: Devin [name], male   DOB: 15-Aug-1941, 76 y.o.   MRN: [id number] Patient's L2 kyphoplasty has been postponed until 9/14 tentatively due to Lovenox dose given last night mistakenly by nursing team. Current nurse made aware of above.

## 2017-06-11 NOTE — Progress Notes (Signed)
[facility] PROGRESS NOTE    Progress Note  Devin [name]  WUJ:[id number] DOB: 11-20-1940 DOA: 06/07/2017 PCP: Devin Branch, MD     Brief Narrative:   Devin [name] is an 76 y.o. male past medical history of acute back pain that started 2 days prior to admission, he relates she slipped early in the morning that day  Assessment/Plan:   Active Problems:   Compression fracture of L2 ([place]) IR was consulted who recommended kyphoplasty likely to happen on 06/11/2017. Physical therapy was consulted recommended skilled nursing facility. Continue symptomatic management. No complains of headache no focal symptoms. Can resume full liquid diet and no bands as tolerated after procedure okay with interventional radiology.  Constipation: Start MiraLAX twice a day and he'll see his on narcotics.  Essential hypertension: Pressure seems to be controlled off antihypertensive medication.   DVT prophylaxis: lovenox Family Communication:none Disposition Plan/Barrier to D/C: home in friday Code Status:     Code Status Orders        Start     Ordered   06/07/17 1905  Full code  Continuous     06/07/17 1906    Code Status History    Date Active Date Inactive Code Status Order ID Comments User Context   This patient has a current code status but no historical code status.    Advance Directive Documentation     Most Recent Value  Type of Advance Directive  Healthcare Power of Attorney, Living will  Pre-existing out of facility DNR order (yellow form or pink MOST form)  -  "MOST" Form in Place?  -        IV Access:    Peripheral IV   Procedures and diagnostic studies:   No results found.   Medical Consultants:    None.  Anti-Infectives:     Subjective:    Devin [name] no new complains, has not had a bowel movement.  Objective:    Vitals:   06/10/17 0628 06/10/17 1330 06/10/17 2037 06/11/17 0520  BP: 129/63 (!) 142/74 (!) 145/77 138/73  Pulse: 81 88 87 88   Resp: 18 18 17 17   Temp: 97.8 F (36.6 C) 98 F (36.7 C) 98 F (36.7 C) 98.7 F (37.1 C)  TempSrc:  Oral Oral Oral  SpO2: 91% 93% 91% 93%  Weight:      Height:        Intake/Output Summary (Last 24 hours) at 06/11/17 0857 Last data filed at 06/11/17 0817  Gross per 24 hour  Intake              180 ml  Output              775 ml  Net             -595 ml   Filed Weights   06/07/17 1307  Weight: 97.5 kg (215 lb)    Exam: General exam: In no acute distress. Respiratory system: Good air movement and clear to auscultation. Cardiovascular system: S1 & S2 heard, RRR. No JVD, murmurs, rubs, gallops or clicks.  Gastrointestinal system: Abdomen is nondistended, soft and nontender.  Central nervous system: Alert and oriented. No focal neurological deficits. Extremities: No pedal edema. Skin: No rashes, lesions or ulcers Psychiatry: Judgement and insight appear normal. Mood & affect appropriate.    Data Reviewed:    Labs: Basic Metabolic Panel:  Recent Labs Lab 06/07/17 1554 06/10/17 0539 06/11/17 0556  NA 137 137 136  K  3.8 3.8 3.9  CL 103 101 99*  CO2 25 27 27   GLUCOSE 102* 113* 119*  BUN 15 17 15   CREATININE 0.72 0.78 0.77  CALCIUM 8.9 9.0 9.1   GFR Estimated Creatinine Clearance: 92.8 mL/min (by C-G formula based on SCr of 0.77 mg/dL). Liver Function Tests: No results for input(s): AST, ALT, ALKPHOS, BILITOT, PROT, ALBUMIN in the last 168 hours. No results for input(s): LIPASE, AMYLASE in the last 168 hours. No results for input(s): AMMONIA in the last 168 hours. Coagulation profile  Recent Labs Lab 06/11/17 0556  INR 0.96    CBC:  Recent Labs Lab 06/07/17 1554 06/10/17 0539 06/11/17 0556  WBC 8.4 6.9 7.9  NEUTROABS  --   --  5.0  HGB 13.8 13.6 13.9  HCT 40.6 39.2 40.1  MCV 91.2 90.3 90.1  PLT 136* 159 173   Cardiac Enzymes:  Recent Labs Lab 06/07/17 1554  CKTOTAL 99   BNP (last 3 results) No results for input(s): PROBNP in the last  8760 hours. CBG: No results for input(s): GLUCAP in the last 168 hours. D-Dimer: No results for input(s): DDIMER in the last 72 hours. Hgb A1c: No results for input(s): HGBA1C in the last 72 hours. Lipid Profile: No results for input(s): CHOL, HDL, LDLCALC, TRIG, CHOLHDL, LDLDIRECT in the last 72 hours. Thyroid function studies: No results for input(s): TSH, T4TOTAL, T3FREE, THYROIDAB in the last 72 hours.  Invalid input(s): FREET3 Anemia work up: No results for input(s): VITAMINB12, FOLATE, FERRITIN, TIBC, IRON, RETICCTPCT in the last 72 hours. Sepsis Labs:  Recent Labs Lab 06/07/17 1554 06/10/17 0539 06/11/17 0556  WBC 8.4 6.9 7.9   Microbiology Recent Results (from the past 240 hour(s))  Culture, Urine     Status: None   Collection Time: 06/07/17  2:14 PM  Result Value Ref Range Status   Specimen Description URINE, RANDOM  Final   Special Requests NONE  Final   Culture   Final    NO GROWTH Performed at [facility], [address], [place], [place] [address]    Report Status 06/09/2017 FINAL  Final     Medications:   . enoxaparin (LOVENOX) injection  40 mg Subcutaneous Q24H  . pantoprazole  40 mg Oral Daily  . polyethylene glycol  17 g Oral BID  . traMADol  100 mg Oral Q12H   Continuous Infusions: .  ceFAZolin (ANCEF) IV      LOS: 3 days   Charlynne Cousins  [facility] Pager [phone]  *Please refer to [email or web address], password [email or web address] to get updated schedule on who will round on this patient, as hospitalists switch teams weekly. If 7PM-7AM, please contact night-coverage at [email or web address], password [email or web address] for any overnight needs.  06/11/2017, 8:57 AM

## 2017-06-11 NOTE — Discharge Instructions (Signed)
Balloon Kyphoplasty, Care After °Refer to this sheet in the next few weeks. These instructions provide you with information about caring for yourself after your procedure. Your health care provider may also give you more specific instructions. Your treatment has been planned according to current medical practices, but problems sometimes occur. Call your health care provider if you have any problems or questions after your procedure. °What can I expect after the procedure? °After your procedure, it is common to have back pain. °Follow these instructions at home: °Incision care °· Follow instructions from your health care provider about how to take care of your incisions. Make sure you: °? Wash your hands with soap and water before you change your bandage (dressing). If soap and water are not available, use hand sanitizer. °? Change your dressing as told by your health care provider. °? Leave stitches (sutures), skin glue, or adhesive strips in place. These skin closures may need to be in place for 2 weeks or longer. If adhesive strip edges start to loosen and curl up, you may trim the loose edges. Do not remove adhesive strips completely unless your health care provider tells you to do that. °· Check your incision area every day for signs of infection. Watch for: °? Redness, swelling, or pain. °? Fluid, blood, or pus. °· Keep your dressing dry until your health care provider says that it can be removed. °Activity ° °· Rest your back and avoid intense physical activity for as long as told by your health care provider. °· Return to your normal activities as told by your health care provider. Ask your health care provider what activities are safe for you. °· Do not lift anything that is heavier than 10 lb (4.5 kg). This is about the weight of a gallon of milk. You may need to avoid heavy lifting for several weeks. °General instructions °· Take over-the-counter and prescription medicines only as told by your health care  provider. °· If directed, apply ice to the painful area: °? Put ice in a plastic bag. °? Place a towel between your skin and the bag. °? Leave the ice on for 20 minutes, 2-3 times per day. °· Do not use tobacco products, including cigarettes, chewing tobacco, or e-cigarettes. If you need help quitting, ask your health care provider. °· Keep all follow-up visits as told by your health care provider. This is important. °Contact a health care provider if: °· You have a fever. °· You have redness, swelling, or pain at the site of your incisions. °· You have fluid, blood, or pus coming from your incisions. °· You have pain that gets worse or does not get better with medicine. °· You develop numbness or weakness in any part of your body. °Get help right away if: °· You have chest pain. °· You have difficulty breathing. °· You cannot move your legs. °· You cannot control your bladder or bowel movements. °· You suddenly become weak or numb on one side of your body. °· You become very confused. °· You have trouble speaking or understanding, or both. °This information is not intended to replace advice given to you by your health care provider. Make sure you discuss any questions you have with your health care provider. °Document Released: 06/06/2015 Document Revised: 02/21/2016 Document Reviewed: 01/08/2015 °Elsevier Interactive Patient Education © 2018 Elsevier Inc[employer] °Moderate Conscious Sedation, Adult, Care After °These instructions provide you with information about caring for yourself after your procedure. Your health care provider may also give you   more specific instructions. Your treatment has been planned according to current medical practices, but problems sometimes occur. Call your health care provider if you have any problems or questions after your procedure. °What can I expect after the procedure? °After your procedure, it is common: °· To feel sleepy for several hours. °· To feel clumsy and have poor balance for  several hours. °· To have poor judgment for several hours. °· To vomit if you eat too soon. ° °Follow these instructions at home: °For at least 24 hours after the procedure: ° °· Do not: °? Participate in activities where you could fall or become injured. °? Drive. °? Use heavy machinery. °? Drink alcohol. °? Take sleeping pills or medicines that cause drowsiness. °? Make important decisions or sign legal documents. °? Take care of children on your own. °· Rest. °Eating and drinking °· Follow the diet recommended by your health care provider. °· If you vomit: °? Drink water, juice, or soup when you can drink without vomiting. °? Make sure you have little or no nausea before eating solid foods. °General instructions °· Have a responsible adult stay with you until you are awake and alert. °· Take over-the-counter and prescription medicines only as told by your health care provider. °· If you smoke, do not smoke without supervision. °· Keep all follow-up visits as told by your health care provider. This is important. °Contact a health care provider if: °· You keep feeling nauseous or you keep vomiting. °· You feel light-headed. °· You develop a rash. °· You have a fever. °Get help right away if: °· You have trouble breathing. °This information is not intended to replace advice given to you by your health care provider. Make sure you discuss any questions you have with your health care provider. °Document Released: 07/06/2013 Document Revised: 02/18/2016 Document Reviewed: 01/05/2016 °Elsevier Interactive Patient Education © 2018 Elsevier Inc. ° °

## 2017-06-12 ENCOUNTER — Encounter (HOSPITAL_COMMUNITY): Payer: Self-pay | Admitting: Interventional Radiology

## 2017-06-12 ENCOUNTER — Inpatient Hospital Stay (HOSPITAL_COMMUNITY): Payer: Medicare HMO

## 2017-06-12 HISTORY — PX: IR KYPHO LUMBAR INC FX REDUCE BONE BX UNI/BIL CANNULATION INC/IMAGING: IMG5519

## 2017-06-12 MED ORDER — POLYETHYLENE GLYCOL 3350 17 G PO PACK
17.0000 g | PACK | Freq: Two times a day (BID) | ORAL | Status: DC
  Administered 2017-06-12 – 2017-06-13 (×2): 17 g via ORAL
  Filled 2017-06-12 (×2): qty 1

## 2017-06-12 MED ORDER — DICLOFENAC SODIUM 1 % TD GEL
2.0000 g | Freq: Four times a day (QID) | TRANSDERMAL | Status: DC
  Administered 2017-06-12: 19:00:00 2 g via TOPICAL
  Filled 2017-06-12: qty 100

## 2017-06-12 MED ORDER — ACETAMINOPHEN 325 MG PO TABS: 650.0000 mg | ORAL_TABLET | Freq: Four times a day (QID) | ORAL | Status: DC | PRN

## 2017-06-12 MED ORDER — MIDAZOLAM HCL 2 MG/2ML IJ SOLN
INTRAMUSCULAR | Status: AC
  Filled 2017-06-12: qty 4

## 2017-06-12 MED ORDER — IOPAMIDOL (ISOVUE-300) INJECTION 61%
INTRAVENOUS | Status: AC
  Filled 2017-06-12: qty 50

## 2017-06-12 MED ORDER — MIDAZOLAM HCL 2 MG/2ML IJ SOLN
INTRAMUSCULAR | Status: AC | PRN
  Administered 2017-06-12 (×3): 1 mg via INTRAVENOUS

## 2017-06-12 MED ORDER — CEFAZOLIN SODIUM-DEXTROSE 2-4 GM/100ML-% IV SOLN
INTRAVENOUS | Status: AC
  Administered 2017-06-12: 14:00:00 2 g via INTRAVENOUS
  Filled 2017-06-12: qty 100

## 2017-06-12 MED ORDER — LIDOCAINE HCL (PF) 2 % IJ SOLN
INTRAMUSCULAR | Status: AC
  Filled 2017-06-12: qty 20

## 2017-06-12 MED ORDER — HYDROCODONE-ACETAMINOPHEN 5-325 MG PO TABS
1.0000 | ORAL_TABLET | Freq: Four times a day (QID) | ORAL | Status: DC | PRN
Start: 2017-06-12 — End: 2017-06-13
  Administered 2017-06-12 – 2017-06-13 (×2): 2 via ORAL
  Administered 2017-06-13: 10:00:00 1 via ORAL
  Filled 2017-06-12 (×3): qty 2
  Filled 2017-06-12: qty 1

## 2017-06-12 MED ORDER — FENTANYL CITRATE (PF) 100 MCG/2ML IJ SOLN
INTRAMUSCULAR | Status: AC | PRN
  Administered 2017-06-12 (×2): 50 ug via INTRAVENOUS

## 2017-06-12 MED ORDER — FENTANYL CITRATE (PF) 100 MCG/2ML IJ SOLN
INTRAMUSCULAR | Status: AC
Start: 2017-06-12 — End: 2017-06-13
  Filled 2017-06-12: qty 4

## 2017-06-12 NOTE — Procedures (Signed)
Interventional Radiology Procedure Note  Procedure: L2 kyphoplasty  Complications: None  Estimated Blood Loss: <25 mL  Recommendations: - Bedrest x 3 hrs - Pain control  Signed,  Criselda Peaches, MD

## 2017-06-12 NOTE — Progress Notes (Signed)
Back from IR

## 2017-06-12 NOTE — Progress Notes (Signed)
  PROGRESS NOTE  Devin [name] OVZ:[id number] DOB: 14-Oct-1940 DOA: 06/07/2017 PCP: Colon Branch, MD  Brief Narrative: 76 year old man fell 9/7, was seen in the emergency department after Tylenol and ibuprofen were unable to manage his pain. He was found to have L2 compression fracture, given Percocet and Valium for pain control and discharged from the [place] emergency department. He presented to the emergency department 9/9 is a severe pain preventing him from standing up, also with radiation of pain down right leg. Pain was uncontrolled in the emergency department he was referred for further evaluation. Pain remained excruciating and the following day interventional radiology was consult for consideration of vertebroplasty/kyphoplasty. MRI was ordered. The following day with these results, plans were made for preauthorization with tentative plans for kyphoplasty 9/13.  Assessment/Plan Acute L2 vertebral body compression fracture with severe back pain, sciatic pain on the right, with 25% height loss. No retropulsion.  -Status post kyphoplasty 9/14. -PT evaluation 9/15. Trial oral analgesics.   Can likely discharge 9/15 after PT evaluation.  DVT prophylaxis: can resume Lovenox when appropriate post-proceudre Code Status: full Family Communication: wife at bedside. RN April present for conversation Disposition Plan: SNF    Murray Hodgkins, MD  [facility] Direct contact: [phone] --Via amion app OR  --[email or web address]; password [email or web address]  7PM-7AM contact night coverage as above 06/12/2017, 2:06 PM  LOS: 4 days   Consultants:  Interventional radiology  Procedures:    Antimicrobials:    Interval history/Subjective: Feels well after procedure.  Objective: Vitals: Afebrile, 97.7, 17, 75, 125/77, 95% on room air  Exam:     Constitutional: Appears calm, comfortable.  Respiratory. Clear to auscultation bilaterally. No wheezes, rales or rhonchi. Normal respiratory  effort.  Cardiovascular. Regular rate and rhythm. No murmur, rub or gallop. No lower extremity edema.  Musculoskeletal. Most both legs to command.  Psychiatric. Grossly normal mood and affect. Speech fluent and appropriate.   I have personally reviewed the following:   Labs:  Basic metabolic panel unremarkable  CBC unremarkable  Urine culture no growth  Scheduled Meds: . enoxaparin (LOVENOX) injection  40 mg Subcutaneous Q24H  . fentaNYL      . iopamidol      . lidocaine      . midazolam      . pantoprazole  40 mg Oral Daily  . traMADol  100 mg Oral Q12H   Continuous Infusions:  Active Problems:   Compression fracture of L2 ([facility])   LOS: 4 days

## 2017-06-12 NOTE — Progress Notes (Signed)
MD in to see 

## 2017-06-12 NOTE — Progress Notes (Signed)
To IR wife at bedside states when [facility] about pain "i'm OK"

## 2017-06-12 NOTE — Progress Notes (Signed)
MEDICATION-RELATED CONSULT NOTE   IR Procedure Consult - Anticoagulant/Antiplatelet PTA/Inpatient Med List Review by Pharmacist    Procedure: L2 kyphoplasty    Completed: 06/12/2017 at 14:20  Post-Procedural bleeding risk per IR MD assessment:  standard  Antithrombotic medications on inpatient or PTA profile prior to procedure:   None (enoxaparin stopped by hospitalist)    Recommended restart time per IR Post-Procedure Guidelines:  Next morning   Other considerations:      Plan:      If appropriate to resume enoxaparin, suggest resuming enoxaparin 40mg  SQ q24h 9/15 at 10am  No further recommendations  Doreene Eland, PharmD, BCPS.   Pager: [phone] 06/12/2017 3:32 PM

## 2017-06-13 ENCOUNTER — Other Ambulatory Visit: Payer: Self-pay | Admitting: Physician Assistant

## 2017-06-13 DIAGNOSIS — K219 Gastro-esophageal reflux disease without esophagitis: Secondary | ICD-10-CM

## 2017-06-13 DIAGNOSIS — M549 Dorsalgia, unspecified: Secondary | ICD-10-CM

## 2017-06-13 DIAGNOSIS — M545 Low back pain: Secondary | ICD-10-CM

## 2017-06-13 DIAGNOSIS — S32020A Wedge compression fracture of second lumbar vertebra, initial encounter for closed fracture: Secondary | ICD-10-CM

## 2017-06-13 DIAGNOSIS — S32010A Wedge compression fracture of first lumbar vertebra, initial encounter for closed fracture: Secondary | ICD-10-CM

## 2017-06-13 MED ORDER — POLYETHYLENE GLYCOL 3350 17 G PO PACK: 17.0000 g | PACK | Freq: Two times a day (BID) | ORAL | 0 refills | Status: DC

## 2017-06-13 MED ORDER — BISACODYL 5 MG PO TBEC: 5.0000 mg | DELAYED_RELEASE_TABLET | Freq: Every day | ORAL | 0 refills | Status: DC | PRN

## 2017-06-13 MED ORDER — HYDROCODONE-ACETAMINOPHEN 5-325 MG PO TABS: 1.0000 | ORAL_TABLET | Freq: Four times a day (QID) | ORAL | 0 refills | Status: DC | PRN

## 2017-06-13 MED ORDER — CYCLOBENZAPRINE HCL 5 MG PO TABS: 5.0000 mg | ORAL_TABLET | Freq: Three times a day (TID) | ORAL | 0 refills | Status: DC | PRN

## 2017-06-13 NOTE — Care Management Note (Signed)
Case Management Note  Patient Details  Name: HJALMAR BALLENGEE MRN: [id number] Date of Birth: 1940/10/19  Subjective/Objective:   Acute L2 vertebral body compression fracture, s/p Kyphoplasty 9/14                 Action/Plan: Discharge Planning:  NCM spoke to pt and (wife via phone). Offered choice for HH/list provided. Requested [facility]. Wife states they have RW at home. Contacted [facility] rep with new referral.   PCP PAZ, Alda Berthold MD  Expected Discharge Date:  06/13/17               Expected Discharge Plan:  [place]  In-House Referral:  NA  Discharge planning Services  CM Consult  Post Acute Care Choice:  Home Health Choice offered to:  Patient  DME Arranged:  N/A DME Agency:  NA  HH Arranged:  PT [place] Agency:  [facility] (formerly [employer])  Status of Service:  Completed, signed off  If discussed at [employer], dates discussed:    Additional Comments:  Erenest Rasher, RN 06/13/2017, 1:42 PM

## 2017-06-13 NOTE — Discharge Summary (Signed)
Devin [name], is a 76 y.o. male  DOB August 24, 1941  MRN [id number].  Admission date:  06/07/2017  Admitting Physician  Gennaro Africa, MD  Discharge Date:  06/13/2017   Primary MD  Colon Branch, MD  Recommendations for primary care physician for things to follow:   Acute L2 vertebral body compression fracture with severe back pain, sciatic pain on the right, with 25% height loss. No retropulsion.  Status post kyphoplasty 9/14. PT evaluation 9/15.  Flexeril 5mg  po tid prn Norco 5/325mg  1-2 q6h prn pain  Home health PT to evaluate and tx to help with gait and deconditioning, and back pain Please obtain bone density test as outpatient w pcp  Gerd Cont prilosec  Depression Continue pamelor      Admission Diagnosis  fall   Discharge Diagnosis  fall    Principal Problem:   Compression fracture of L2 ([place]) Active Problems:   GERD (gastroesophageal reflux disease)      Past Medical History:  Diagnosis Date  . Cataract   . COPD (chronic obstructive pulmonary disease) ([place])   . Diverticulosis    pt states he had diverticuli in his bladder also.  . H/O [facility] spotted fever 01/27/2014  . Hematuria    over 5 years ago  . Hernia of abdominal cavity   . History of rectal polyps   . Hyperplastic colon polyp   . Hypertension   . Macular degeneration   . Rectal cancer ([place]) 1994  . Shoulder pain, left    per pt, he can lay on the left side!  . Tubular adenoma    01/05/2013    Past Surgical History:  Procedure Laterality Date  . APPENDECTOMY    . COLON SURGERY     rectal cancer ~ 1999  . EYE SURGERY     cataract sx  . HERNIA REPAIR    . IR VERTEBROPLASTY LUMBAR BX INC UNI/BIL INC/INJECT/IMAGING  06/12/2017  . mastoid gland removal     left ear  . PROSTATE SURGERY  05/2015  . TONSILLECTOMY         HPI  from the history and physical done on the day of admission:    76 year old  man fell 9/7, was seen in the emergency department after Tylenol and ibuprofen were unable to manage his pain. He was found to have L2 compression fracture, given Percocet and Valium for pain control and discharged from the [place] emergency department. He presented to the emergency department 9/9 is a severe pain preventing him from standing up, also with radiation of pain down right leg. Pain was uncontrolled in the emergency department he was referred for further evaluation. Pain remained excruciating and the following day interventional radiology was consult for consideration of vertebroplasty/kyphoplasty. MRI was ordered. The following day with these results, plans were made for preauthorization with tentative plans for kyphoplasty 9/13.        Hospital Course:     Pt was admitted for pain control and kyphoplasty.  Pt received kyphoplasty on 9/14  , and is doing much better in terms of pain relief.  Pt requesting discharge to home with home PT.     Follow UP  Follow-up [place], MD Follow up in 1 week(s).   Specialty:  Internal Medicine Contact information: [address] [place] [address] [phone]            Consults obtained - IR  Discharge Condition: stable  Diet and Activity recommendation: See Discharge Instructions below  Discharge Instructions         Discharge Medications     Allergies as of 06/13/2017   No Known Allergies     Medication List    TAKE these medications   50+ ADULT EYE HEALTH PO Take 1 capsule by mouth daily.   acetaminophen 500 MG tablet Commonly known as:  TYLENOL Take 1,000 mg by mouth every 6 (six) hours as needed for mild pain.   bisacodyl 5 MG EC tablet Commonly known as:  DULCOLAX Take 1 tablet (5 mg total) by mouth daily as needed for moderate constipation.   calcium carbonate 600 MG Tabs tablet Commonly known as:  OS-CAL Take 1 tablet (600 mg total) by mouth daily with  breakfast. + vitamin D   cyclobenzaprine 5 MG tablet Commonly known as:  FLEXERIL Take 1 tablet (5 mg total) by mouth 3 (three) times daily as needed for muscle spasms.   HYDROcodone-acetaminophen 5-325 MG tablet Commonly known as:  NORCO/VICODIN Take 1-2 tablets by mouth every 6 (six) hours as needed for moderate pain.   nortriptyline 50 MG capsule Commonly known as:  PAMELOR Take 1 capsule (50 mg total) by mouth at bedtime. What changed:  when to take this  reasons to take this   omeprazole 20 MG capsule Commonly known as:  PRILOSEC Take 1 capsule (20 mg total) by mouth daily. What changed:  when to take this  reasons to take this   polyethylene glycol packet Commonly known as:  MIRALAX / GLYCOLAX Take 17 g by mouth 2 (two) times daily.            Discharge Care Instructions        Start     Ordered   06/13/17 0000  bisacodyl (DULCOLAX) 5 MG EC tablet  Daily PRN     06/13/17 0850   06/13/17 0000  cyclobenzaprine (FLEXERIL) 5 MG tablet  3 times daily PRN     06/13/17 0850   06/13/17 0000  polyethylene glycol (MIRALAX / GLYCOLAX) packet  2 times daily     06/13/17 0850   06/13/17 0000  HYDROcodone-acetaminophen (NORCO/VICODIN) 5-325 MG tablet  Every 6 hours PRN     06/13/17 0850      Major procedures and Radiology Reports - PLEASE review detailed and final reports for all details, in brief -      [name]  Result Date: 06/09/2017 CLINICAL DATA:  Acute low back pain beginning 2 days ago after a fall. L2 compression fracture on prior imaging. Initial encounter. EXAM: MRI LUMBAR SPINE WITHOUT AND WITH CONTRAST TECHNIQUE: Multiplanar and multiecho pulse sequences of the lumbar spine were obtained without and with intravenous contrast. CONTRAST:  65mL MULTIHANCE GADOBENATE DIMEGLUMINE 529 MG/ML IV SOLN COMPARISON:  Lumbar spine CT 06/05/2017 FINDINGS: Segmentation:  Standard. Alignment: 4 mm anterolisthesis of L4 on L5, facet mediated. Trace  retrolisthesis of L1 on L2, L2 on L3, and L3  on L4. Slight left convex curvature of the lumbar spine. Vertebrae: As described on recent CT, there is a mildly comminuted L2 vertebral body fracture with approximately 25% vertebral body height loss anteriorly. There is moderate vertebral marrow edema and enhancement mildly extending into the right pedicle. There is no retropulsion. Mixed degenerative Modic endplate changes are present at L5-S1 including prominent sclerosis. No suspicious osseous lesion is identified. Conus medullaris: Extends to the L1-2 level and appears normal. Paraspinal and other soft tissues: Very mild edema in the paravertebral soft tissues at L1-2. Disc levels: Disc desiccation throughout the lumbar spine. Mild disc space narrowing at L3-4 with moderate narrowing at L4-5 and severe narrowing at L5-S1. L1-2:  Mild disc bulging without stenosis. L2-3: Minimal disc bulging and mild facet hypertrophy without stenosis. L3-4: Mild circumferential disc bulging and mild facet and ligamentum flavum hypertrophy result in borderline to mild bilateral lateral recess stenosis and borderline to mild right neural foraminal stenosis. No significant spinal stenosis. L4-5: Listhesis with mild bulging of uncovered disc, mild ligamentum flavum thickening, and advanced facet arthrosis result in mild spinal stenosis, borderline to mild bilateral lateral recess stenosis, and borderline right neural foraminal stenosis. L5-S1: Circumferential disc bulging, endplate spurring, and mild left facet arthrosis result in mild left neural foraminal stenosis without spinal stenosis. IMPRESSION: 1. Acute L2 vertebral body fracture with 25% height loss. No retropulsion. 2. 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 3. Disc and facet degeneration elsewhere as above. Electronically Signed   By: Logan Bores M.D.   On: 06/09/2017 07:51   Ir Vertebroplasty Lumbar Bx Inc Uni/bil Inc  Inject/imaging  Result Date: 06/12/2017 CLINICAL DATA:  76 year old male with highly symptomatic acute L2 compression fracture. He presents for cement augmentation. EXAM: FLUOROSCOPIC GUIDED KYPHOPLASTY OF THE L2 VERTEBRAL BODY COMPARISON:  CT scan of the lumbar spine 06/05/2017; MRI of the lumbar spine 06/09/2017 MEDICATIONS: As antibiotic prophylaxis, 2 g Ancef was ordered pre-procedure and administered intravenously within 1 hour of incision. ANESTHESIA/SEDATION: Moderate (conscious) sedation was employed during this procedure. A total of Versed Three mg and Fentanyl 100 mcg was administered intravenously. Moderate Sedation Time: 30 minutes minutes. The patient's level of consciousness and vital signs were monitored continuously by radiology nursing throughout the procedure under my direct supervision. FLUOROSCOPY TIME:  6 minutes, 0 seconds (154 mGy) COMPLICATIONS: None immediate. TECHNIQUE: The procedure, risks (including but not limited to bleeding, infection, organ damage), benefits, and alternatives were explained to the patient. Questions regarding the procedure were encouraged and answered. The patient understands and consents to the procedure. The patient was placed prone on the fluoroscopic table. The skin overlying the lumbar region was then prepped and draped in the usual sterile fashion. Maximal barrier sterile technique was utilized including caps, mask, sterile gowns, sterile gloves, sterile drape, hand hygiene and skin antiseptic. Intravenous Fentanyl and Versed were administered as conscious sedation during continuous cardiorespiratory monitoring by the radiology RN. The left pedicle at L2 was then infiltrated with 1% lidocaine followed by the advancement of a Kyphon trocar needle through the left pedicle into the posterior one-third of the vertebral body. Subsequently, the osteo drill was advanced to the anterior third of the vertebral body. The osteo drill was retracted. Through the working  cannula, a Kyphon inflatable bone tamp 20 x 3 was advanced and positioned with the distal marker approximately 5 mm from the anterior aspect of the cortex. Appropriate positioning was confirmed on the AP projection. At this time, the balloon  was expanded using contrast via a Kyphon inflation syringe device via micro tubing. Inflations were continued until there was near apposition with the superior end plate. At this time, methylmethacrylate mixture was reconstituted in the Kyphon bone mixing device system. This was then loaded into the delivery mechanism, attached to Kyphon bone fillers. The balloons were deflated and removed followed by the instillation of methylmethacrylate mixture with excellent filling in the AP and lateral projections. No extravasation was noted in the disk spaces or posteriorly into the spinal canal. No epidural venous contamination was seen. The working cannulae and the bone filler were then retrieved and removed. Hemostasis was achieved with manual compression. The patient tolerated the procedure well without immediate postprocedural complication. IMPRESSION: 1. Technically successful L2 vertebral body augmentation using balloon kyphoplasty. 2. Per CMS PQRS reporting requirements (PQRS Measure 24): Given the patient's age of greater than 67 and the fracture site (hip, distal radius, or spine), the patient should be tested for osteoporosis using DXA, and the appropriate treatment considered based on the DXA results. Signed, Criselda Peaches, MD [facility] Electronically Signed   By: Jacqulynn Cadet M.D.   On: 06/12/2017 17:38    Micro Results     Recent Results (from the past 240 hour(s))  Culture, Urine     Status: None   Collection Time: 06/07/17  2:14 PM  Result Value Ref Range Status   Specimen Description URINE, RANDOM  Final   Special Requests NONE  Final   Culture   Final    NO GROWTH Performed at [facility], [address] [place], [place] [address]    Report Status 06/09/2017 FINAL  Final       Today   Subjective    Ethanael Veith today has been doing well,  Very minimal pain in the back.     No headache,no chest abdominal pain,no new weakness tingling or numbness, feels much better wants to go home today.    Objective   Blood pressure 132/78, pulse 80, temperature 97.7 F (36.5 C), temperature source Oral, resp. rate 16, height 6\' 2"  (1.88 m), weight 97.5 kg (215 lb), SpO2 92 %.   Intake/Output Summary (Last 24 hours) at 06/13/17 0851 Last data filed at 06/12/17 2253  Gross per 24 hour  Intake              380 ml  Output             1600 ml  Net            -1220 ml    Exam Awake Alert, Oriented x 3, No new F.N deficits, Normal affect Bent.AT,PERRAL Supple Neck,No JVD, No cervical lymphadenopathy appriciated.  Symmetrical Chest wall movement, Good air movement bilaterally, CTAB RRR,No Gallops,Rubs or new Murmurs, No Parasternal Heave +ve B.Sounds, Abd Soft, Non tender, No organomegaly appriciated, No rebound -guarding or rigidity. No Cyanosis, Clubbing or edema, No new Rash or bruise SLR negative Gait instability   Data Review   CBC w Diff: Lab Results  Component Value Date   WBC 7.9 06/11/2017   HGB 13.9 06/11/2017   HCT 40.1 06/11/2017   PLT 173 06/11/2017   LYMPHOPCT 19 06/11/2017   MONOPCT 15 06/11/2017   EOSPCT 2 06/11/2017   BASOPCT 0 06/11/2017    CMP: Lab Results  Component Value Date   NA 136 06/11/2017   NA 140 02/06/2015   K 3.9 06/11/2017   CL 99 (L)  06/11/2017   CO2 27 06/11/2017   BUN 15 06/11/2017   BUN 13 02/06/2015   CREATININE 0.77 06/11/2017   CREATININE 0.79 11/27/2015   GLU 95 02/06/2015   PROT 6.5 11/27/2015   ALBUMIN 4.1 11/27/2015   BILITOT 0.3 11/27/2015   ALKPHOS 70 11/27/2015   AST 21 11/27/2015   ALT 20 11/27/2015  .   Total Time in preparing paper work, data evaluation and todays exam - 35 minutes  Jani Gravel M.D on 06/13/2017 at 8:51 AM  [facility]   Office  [phone]

## 2017-06-13 NOTE — Progress Notes (Signed)
Referring Physician(s): Wardell Heath  Supervising Physician: Corrie Mckusick  Patient Status:  Devin [name] - In-pt  Chief Complaint:  Back pain after a fall  Subjective:  Devin [name] is doing great this morning. He is sitting up in the chair. He states he feels much better and that Dr. Laurence Ferrari "fixed him"  Allergies: Patient has no known allergies.  Medications: Prior to Admission medications   Medication Sig Start Date End Date Taking? Authorizing Provider  acetaminophen (TYLENOL) 500 MG tablet Take 1,000 mg by mouth every 6 (six) hours as needed for mild pain.   Yes [provider]  calcium carbonate (OS-CAL) 600 MG TABS tablet Take 1 tablet (600 mg total) by mouth daily with breakfast. + vitamin D 12/19/14  Yes Midge Minium, MD  Multiple Vitamins-Minerals (50+ ADULT EYE HEALTH PO) Take 1 capsule by mouth daily.    Yes [provider]  nortriptyline (PAMELOR) 50 MG capsule Take 1 capsule (50 mg total) by mouth at bedtime. Patient taking differently: Take 50 mg by mouth at bedtime as needed (sleep).  03/23/17  Yes Shelda Pal, DO  omeprazole (PRILOSEC) 20 MG capsule Take 1 capsule (20 mg total) by mouth daily. Patient taking differently: Take 20 mg by mouth daily as needed (heartburn).  06/17/16  Yes Paz, Alda Berthold, MD  bisacodyl (DULCOLAX) 5 MG EC tablet Take 1 tablet (5 mg total) by mouth daily as needed for moderate constipation. 06/13/17   Jani Gravel, MD  cyclobenzaprine (FLEXERIL) 5 MG tablet Take 1 tablet (5 mg total) by mouth 3 (three) times daily as needed for muscle spasms. 06/13/17   Jani Gravel, MD  HYDROcodone-acetaminophen (NORCO/VICODIN) 5-325 MG tablet Take 1-2 tablets by mouth every 6 (six) hours as needed for moderate pain. 06/13/17   Jani Gravel, MD  polyethylene glycol [facility] / Floria Raveling) packet Take 17 g by mouth 2 (two) times daily. 06/13/17   Jani Gravel, MD     Vital Signs: BP (!) 122/52   Pulse 83   Temp 97.7 F (36.5 C)   Resp 18    Ht 6\' 2"  (1.88 m)   Wt 215 lb (97.5 kg)   SpO2 96%   BMI 27.60 kg/m   Physical Exam Awake and alert NAD Heart RRR Lungs clear Back dressing clean, no bleeding.  Imaging: Ir Vertebroplasty Lumbar Bx Inc Uni/bil Inc Inject/imaging  Result Date: 06/12/2017 CLINICAL DATA:  76 year old male with highly symptomatic acute L2 compression fracture. He presents for cement augmentation. EXAM: FLUOROSCOPIC GUIDED KYPHOPLASTY OF THE L2 VERTEBRAL BODY COMPARISON:  CT scan of the lumbar spine 06/05/2017; MRI of the lumbar spine 06/09/2017 MEDICATIONS: As antibiotic prophylaxis, 2 g Ancef was ordered pre-procedure and administered intravenously within 1 hour of incision. ANESTHESIA/SEDATION: Moderate (conscious) sedation was employed during this procedure. A total of Versed Three mg and Fentanyl 100 mcg was administered intravenously. Moderate Sedation Time: 30 minutes minutes. The patient's level of consciousness and vital signs were monitored continuously by radiology nursing throughout the procedure under my direct supervision. FLUOROSCOPY TIME:  6 minutes, 0 seconds (161 mGy) COMPLICATIONS: None immediate. TECHNIQUE: The procedure, risks (including but not limited to bleeding, infection, organ damage), benefits, and alternatives were explained to the patient. Questions regarding the procedure were encouraged and answered. The patient understands and consents to the procedure. The patient was placed prone on the fluoroscopic table. The skin overlying the lumbar region was then prepped and draped in the usual sterile fashion. Maximal barrier sterile technique was utilized including  caps, mask, sterile gowns, sterile gloves, sterile drape, hand hygiene and skin antiseptic. Intravenous Fentanyl and Versed were administered as conscious sedation during continuous cardiorespiratory monitoring by the radiology RN. The left pedicle at L2 was then infiltrated with 1% lidocaine followed by the advancement of a Kyphon  trocar needle through the left pedicle into the posterior one-third of the vertebral body. Subsequently, the osteo drill was advanced to the anterior third of the vertebral body. The osteo drill was retracted. Through the working cannula, a Kyphon inflatable bone tamp 20 x 3 was advanced and positioned with the distal marker approximately 5 mm from the anterior aspect of the cortex. Appropriate positioning was confirmed on the AP projection. At this time, the balloon was expanded using contrast via a Kyphon inflation syringe device via micro tubing. Inflations were continued until there was near apposition with the superior end plate. At this time, methylmethacrylate mixture was reconstituted in the Kyphon bone mixing device system. This was then loaded into the delivery mechanism, attached to Kyphon bone fillers. The balloons were deflated and removed followed by the instillation of methylmethacrylate mixture with excellent filling in the AP and lateral projections. No extravasation was noted in the disk spaces or posteriorly into the spinal canal. No epidural venous contamination was seen. The working cannulae and the bone filler were then retrieved and removed. Hemostasis was achieved with manual compression. The patient tolerated the procedure well without immediate postprocedural complication. IMPRESSION: 1. Technically successful L2 vertebral body augmentation using balloon kyphoplasty. 2. Per CMS PQRS reporting requirements (PQRS Measure 24): Given the patient's age of greater than 75 and the fracture site (hip, distal radius, or spine), the patient should be tested for osteoporosis using DXA, and the appropriate treatment considered based on the DXA results. Signed, Criselda Peaches, MD [facility] Radiology Electronically Signed   By: Jacqulynn Cadet M.D.   On: 06/12/2017 17:38    Labs:  CBC:  Recent Labs  10/14/16 1046 06/07/17 1554  06/10/17 0539 06/11/17 0556  WBC 6.9 8.4 6.9 7.9  HGB 14.4 13.8 13.6 13.9  HCT 43.0 40.6 39.2 40.1  PLT 175.0 136* 159 173    COAGS:  Recent Labs  06/11/17 0556  INR 0.96    BMP:  Recent Labs  10/14/16 1046 06/07/17 1554 06/10/17 0539 06/11/17 0556  NA 138 137 137 136  K 4.3 3.8 3.8 3.9  CL 103 103 101 99*  CO2 30 25 27 27   GLUCOSE 104* 102* 113* 119*  BUN 15 15 17 15   CALCIUM 9.4 8.9 9.0 9.1  CREATININE 0.82 0.72 0.78 0.77  GFRNONAA  --  >60 >60 >60  GFRAA  --  >60 >60 >60    LIVER FUNCTION TESTS: No results for input(s): BILITOT, AST, ALT, ALKPHOS, PROT, ALBUMIN in the last 8760 hours.  Assessment and Plan:  L2 Compression fracture = S/P Kyphoplasty by Dr. Laurence Ferrari 06/12/2017.  Doing well.  OK to D/C Home.  Patient can remove dressing later today and shower.  I will arrange f/u at our office in 2-4- weeks.  Electronically Signed: Murrell Redden, PA-C 06/13/2017, 1:24 PM   I spent a total of 15 Minutes at the the patient's bedside AND on the patient's hospital floor or unit, greater than 50% of which was counseling/coordinating care for f/u kyphoplasty

## 2017-06-13 NOTE — Progress Notes (Signed)
Physical Therapy Treatment Patient Details Name: Devin [name] MRN: [id number] DOB: [birth year] Today's Date: 06/13/2017    History of Present Illness 76 yo male who came with cc of acute back pain started 2 days ago after he fell while at home.  CT scan of his lumbar confirmed L2 fracture and was sent home on oxycodone and valium but the pain was so severe that pain meds did not help and he was not able to ambulate; s/p L2 kyphoplasty  on 9/14    PT Comments    Pt progressing well today; pain greatly improved since kyphoplasty; amb 120' with supervision and RW;  Recommend RW for home, HHPT with pt progressing to OPPT d/t his hx of falls; have discussed with pt, wife, & RN    Follow Up Recommendations  Home health PT;Outpatient PT;Supervision for mobility/OOB     Equipment Recommendations  Rolling walker with 5" wheels    Recommendations for Other Services       Precautions / Restrictions Precautions Precautions: Fall;Back Precaution Comments: back precautions for comfort with L2 compression fx Restrictions Weight Bearing Restrictions: No    Mobility  Bed Mobility Overal bed mobility: Needs Assistance Bed Mobility: Rolling;Sidelying to Sit Rolling: Supervision Sidelying to sit: Supervision       General bed mobility comments: cues for log roll, HOB raised by pt--reports he has adjustable bed at home  Transfers Overall transfer level: Needs assistance Equipment used: Rolling walker (2 wheeled) Transfers: Sit to/from Stand Sit to Stand: Supervision         General transfer comment: cues for overall safety, hand placement, use of LEs to control descent  Ambulation/Gait Ambulation/Gait assistance: Supervision Ambulation Distance (Feet): 120 Feet Assistive device: Rolling walker (2 wheeled) Gait Pattern/deviations: Step-through pattern;Decreased stride length     General Gait Details: cues for posture, stride length   Stairs            Wheelchair  Mobility    Modified Rankin (Stroke Patients Only)       Balance Overall balance assessment: History of Falls;Needs assistance         Standing balance support: Single extremity supported;No upper extremity supported Standing balance-Leahy Scale: Fair Standing balance comment: reliant on UEs for dynamic balance                            Cognition Arousal/Alertness: Awake/alert Behavior During Therapy: WFL for tasks assessed/performed Overall Cognitive Status: Within Functional Limits for tasks assessed                                        Exercises      General Comments        Pertinent Vitals/Pain Pain Assessment: Faces Faces Pain Scale: Hurts a little bit Pain Location: L chest/pec Pain Descriptors / Indicators: Sore Pain Intervention(s): Monitored during session    Home Living                      Prior Function            PT Goals (current goals can now be found in the care plan section) Acute Rehab PT Goals Patient Stated Goal: home if at all possible PT Goal Formulation: With patient Time For Goal Achievement: 06/15/17 Potential to Achieve Goals: Good Progress towards PT goals: Progressing toward goals  Frequency    Min 3X/week      PT Plan Discharge plan needs to be updated    Co-evaluation              AM-PAC PT "6 Clicks" Daily Activity  Outcome Measure  Difficulty turning over in bed (including adjusting bedclothes, sheets and blankets)?: A Little Difficulty moving from lying on back to sitting on the side of the bed? : A Little Difficulty sitting down on and standing up from a chair with arms (e.g., wheelchair, bedside commode, etc,.)?: A Little Help needed moving to and from a bed to chair (including a wheelchair)?: A Little Help needed walking in hospital room?: A Little Help needed climbing 3-5 steps with a railing? : A Little 6 Click Score: 18    End of Session Equipment Utilized  During Treatment: Gait belt Activity Tolerance: Patient tolerated treatment well Patient left: in chair;with call bell/phone within reach   PT Visit Diagnosis: Difficulty in walking, not elsewhere classified (R26.2);Pain     Time: 1111-1150 PT Time Calculation (min) (ACUTE ONLY): 39 min  Charges:  $Gait Training: 23-37 mins $Self Care/Home Management: 8-22                    G Codes:       Kenyon Ana, PT Pager: [phone] 06/13/2017    Kenyon Ana 06/13/2017, 12:09 PM

## 2017-06-15 ENCOUNTER — Inpatient Hospital Stay: Payer: Medicare HMO | Admitting: Internal Medicine

## 2017-06-15 ENCOUNTER — Encounter (HOSPITAL_COMMUNITY): Payer: Self-pay | Admitting: Radiology

## 2017-06-15 ENCOUNTER — Telehealth: Payer: Self-pay | Admitting: Behavioral Health

## 2017-06-15 DIAGNOSIS — S32029D Unspecified fracture of second lumbar vertebra, subsequent encounter for fracture with routine healing: Secondary | ICD-10-CM | POA: Diagnosis not present

## 2017-06-15 DIAGNOSIS — M5431 Sciatica, right side: Secondary | ICD-10-CM | POA: Diagnosis not present

## 2017-06-15 DIAGNOSIS — I10 Essential (primary) hypertension: Secondary | ICD-10-CM | POA: Diagnosis not present

## 2017-06-15 DIAGNOSIS — Z79891 Long term (current) use of opiate analgesic: Secondary | ICD-10-CM | POA: Diagnosis not present

## 2017-06-15 DIAGNOSIS — J449 Chronic obstructive pulmonary disease, unspecified: Secondary | ICD-10-CM | POA: Diagnosis not present

## 2017-06-15 DIAGNOSIS — H3530 Unspecified macular degeneration: Secondary | ICD-10-CM | POA: Diagnosis not present

## 2017-06-15 NOTE — Telephone Encounter (Signed)
Patient cancelled 11:30am appointment stating he's in to much pain, patient [facility] to Wednesday 06/17/2017 at 11:30am, charge or no charge,please advise

## 2017-06-15 NOTE — Telephone Encounter (Signed)
No Charge 

## 2017-06-15 NOTE — Telephone Encounter (Signed)
Patient declined to complete TCM/Hospital Follow-up call, but scheduled an appointment to be seen today with PCP at 11:30 AM.

## 2017-06-17 ENCOUNTER — Inpatient Hospital Stay: Payer: Medicare HMO | Admitting: Internal Medicine

## 2017-06-17 DIAGNOSIS — Z0289 Encounter for other administrative examinations: Secondary | ICD-10-CM

## 2017-06-18 DIAGNOSIS — I10 Essential (primary) hypertension: Secondary | ICD-10-CM | POA: Diagnosis not present

## 2017-06-18 DIAGNOSIS — J449 Chronic obstructive pulmonary disease, unspecified: Secondary | ICD-10-CM | POA: Diagnosis not present

## 2017-06-18 DIAGNOSIS — H3530 Unspecified macular degeneration: Secondary | ICD-10-CM | POA: Diagnosis not present

## 2017-06-18 DIAGNOSIS — S32029D Unspecified fracture of second lumbar vertebra, subsequent encounter for fracture with routine healing: Secondary | ICD-10-CM | POA: Diagnosis not present

## 2017-06-18 DIAGNOSIS — M5431 Sciatica, right side: Secondary | ICD-10-CM | POA: Diagnosis not present

## 2017-06-18 DIAGNOSIS — Z79891 Long term (current) use of opiate analgesic: Secondary | ICD-10-CM | POA: Diagnosis not present

## 2017-06-19 ENCOUNTER — Telehealth: Payer: Self-pay | Admitting: *Deleted

## 2017-06-19 ENCOUNTER — Encounter: Payer: Self-pay | Admitting: Internal Medicine

## 2017-06-19 ENCOUNTER — Ambulatory Visit (INDEPENDENT_AMBULATORY_CARE_PROVIDER_SITE_OTHER): Payer: Medicare HMO | Admitting: Internal Medicine

## 2017-06-19 VITALS — BP 118/78 | HR 97 | Temp 98.1°F | Resp 14 | Ht 74.0 in | Wt 205.4 lb

## 2017-06-19 DIAGNOSIS — Z85828 Personal history of other malignant neoplasm of skin: Secondary | ICD-10-CM

## 2017-06-19 DIAGNOSIS — Z23 Encounter for immunization: Secondary | ICD-10-CM | POA: Diagnosis not present

## 2017-06-19 DIAGNOSIS — S32020A Wedge compression fracture of second lumbar vertebra, initial encounter for closed fracture: Secondary | ICD-10-CM | POA: Diagnosis not present

## 2017-06-19 NOTE — Telephone Encounter (Signed)
Received [place]; forwarded to provider/SLS 09/21

## 2017-06-19 NOTE — Progress Notes (Signed)
Subjective:    Patient ID: Devin [name], male    DOB: [birth year], 76 y.o.   MRN: [id number]  DOS:  06/19/2017 Type of visit - description : TCM 7 Interval history: Patient was admitted to the hospital 06/07/2017 and discharged 06/13/2017. He was admitted with acute back pain 2 days after he had a fall. Had a MRI done and he was diagnosed with a L2 vertebral fracture, pain was severe, eventually had a kyphoplasty 06/12/2017. The patient reported that he briefly lost consciousness after the fall but states he was mostly due to the pain. No workup was done. Was discharged home with PT Was recommended a bone density test as an outpatient.  Review of Systems Since he left the hospital, the pain has decreased significantly but this is still there. Denies lower extremity paresthesias, no bladder or bowel incontinence No headaches. Additionally, has a skin lesion on the nose and request a dermatology referral  Past Medical History:  Diagnosis Date  . Cataract   . COPD (chronic obstructive pulmonary disease) ([place])   . Diverticulosis    pt states he had diverticuli in his bladder also.  . H/O [facility] [place] spotted fever 01/27/2014  . Hematuria    over 5 years ago  . Hernia of abdominal cavity   . History of rectal polyps   . Hyperplastic colon polyp   . Hypertension   . Macular degeneration   . Rectal cancer ([place]) 1994  . Shoulder pain, left    per pt, he can lay on the left side!  . Tubular adenoma    01/05/2013    Past Surgical History:  Procedure Laterality Date  . APPENDECTOMY    . COLON SURGERY     rectal cancer ~ 1999  . EYE SURGERY     cataract sx  . HERNIA REPAIR    . IR KYPHO LUMBAR INC FX REDUCE BONE BX UNI/BIL CANNULATION INC/IMAGING  06/12/2017  . mastoid gland removal     left ear  . PROSTATE SURGERY  05/2015  . TONSILLECTOMY      Social History   Social History  . Marital status: Married    Spouse name: Denice Paradise  . Number of children: 2  . Years of education:  N/A   Occupational History  . RetiredSocial research officer, government    Social History Main Topics  . Smoking status: Former Smoker    Quit date: 04/19/1989  . Smokeless tobacco: Never Used  . Alcohol use 8.4 oz/week    14 Glasses of wine per week     Comment: most days  , amounts varies , sometimes > 2 servings   . Drug use: No  . Sexual activity: Not on file   Other Topics Concern  . Not on file   Social History Narrative   Household-- pt, wife (she has a post polio syndrome)   Lost oldest son ~ 2011   Moved from [place] ~ 2011   Youngest son lives in [place] reason why he moved here        Allergies as of 06/19/2017   No Known Allergies     Medication List       Accurate as of 06/19/17 11:59 PM. Always use your most recent med list.          50+ ADULT EYE HEALTH PO Take 1 capsule by mouth daily.   acetaminophen 500 MG tablet Commonly known as:  TYLENOL Take 1,000 mg by mouth every 6 (six) hours as  needed for mild pain.   bisacodyl 5 MG EC tablet Commonly known as:  DULCOLAX Take 1 tablet (5 mg total) by mouth daily as needed for moderate constipation.   calcium carbonate 600 MG Tabs tablet Commonly known as:  OS-CAL Take 1 tablet (600 mg total) by mouth daily with breakfast. + vitamin D   cyclobenzaprine 5 MG tablet Commonly known as:  FLEXERIL Take 1 tablet (5 mg total) by mouth 3 (three) times daily as needed for muscle spasms.   HYDROcodone-acetaminophen 5-325 MG tablet Commonly known as:  NORCO/VICODIN Take 1-2 tablets by mouth every 6 (six) hours as needed for moderate pain.   nortriptyline 50 MG capsule Commonly known as:  PAMELOR Take 1 capsule (50 mg total) by mouth at bedtime.   omeprazole 20 MG capsule Commonly known as:  PRILOSEC Take 1 capsule (20 mg total) by mouth daily.   polyethylene glycol packet Commonly known as:  MIRALAX / GLYCOLAX Take 17 g by mouth 2 (two) times daily.            Discharge Care Instructions        Start      Ordered   06/19/17 0000  Flu Vaccine QUAD 6+ mos PF IM (Fluarix Quad PF)     06/19/17 1147   06/19/17 0000  DG Bone Density    Question Answer Comment  Reason for Exam (SYMPTOM  OR DIAGNOSIS REQUIRED) L2 compression fx, screening for osteoporosis   Preferred imaging location? [facility]      06/19/17 1151   06/19/17 0000  Ambulatory referral to Physical Therapy     06/19/17 1151   06/19/17 0000  Ambulatory referral to Dermatology     06/19/17 1151         Objective:   Physical Exam BP 118/78 (BP Location: Left Arm, Patient Position: Sitting, Cuff Size: Small)   Pulse 97   Temp 98.1 F (36.7 C) (Oral)   Resp 14   Ht 6\' 2"  (1.88 m)   Wt 205 lb 6 oz (93.2 kg)   SpO2 97%   BMI 26.37 kg/m  General:   Well developed, well nourished . NAD.  HEENT:  Normocephalic . Face symmetric, atraumatic Lungs:  CTA B Normal respiratory effort, no intercostal retractions, no accessory muscle use. Heart: RRR,  no murmur.  No pretibial edema bilaterally  Skin: Erythematous lesion, approximately 2 mm and the left side of the nose  Neurologic:  alert & oriented X3.  Speech normal, gait assisted by a cane, moves with difficulty. Motor symmetric. Psych--  Cognition and judgment appear intact.  Cooperative with normal attention span and concentration.  Behavior appropriate. No anxious or depressed appearing.      Assessment & Plan:    Assessment HTN Depression-insomnia, lost a son ~2011 GERD sx onset ~2016 DJD- hip injection 2017  BPH --s/p surgery x2 2016 , Dr Hazle Nordmann H/o etoh, mild  Rectal cancer-- s/p surgery ~ 1999, no chemo-XRT FH CAD, F MI age 23 Former light smoker , No symptoms  PLAN L2 vertebral fracture: S/p kyphoplasty, pain decreased but not gone. Likes to do PT at the office not at home. Will send a referral. Will get a DEXA. No history of vitamin D deficiency. Fall precautions discussed and the hospital and here today. Does not like a strong medication  for pain consequently strategy will be a heating pad, judicious use of Tylenol and ibuprofen. GI precautions discussed. See instructions. Okay to take Flexeril and hydrocodone as prescribed  by the hospital if needed. Patient likes to restart driving his car, he actually likes to drive to [place]. I'm concerned about it and let him know that. If he drives needs  frequent stops, stretching and watch for worsening pain. Skin lesion, nose:  previously dx w/ skin cancer, was treated by a dermatologist, would like to see another dermatologist. Refer to Dr. Allyson Sabal. Flu shot today. RTC 09-2017

## 2017-06-19 NOTE — Progress Notes (Signed)
Pre visit review using our clinic review tool, if applicable. No additional management support is needed unless otherwise documented below in the visit note. 

## 2017-06-19 NOTE — Patient Instructions (Signed)
See you in January for your physical  For pain: -Use a heating pad -Tylenol  500 mg OTC 2 tabs a day every 8 hours as needed for pain -IBUPROFEN (Advil or Motrin) 200 mg 2 tablets every 6 hours as needed for pain.  Always take it with food because may cause gastritis and ulcers.  If you notice nausea, stomach pain, change in the color of stools --->  Stop the medicine and let us know Try to minimize the use of Motrin to no more than 3 or 4 times a week. -You can also take cyclobenzaprine and hydrocodone as prescribed by the hospital if the pain is severe.

## 2017-06-20 NOTE — Assessment & Plan Note (Addendum)
  L2 vertebral fracture: S/p kyphoplasty, pain decreased but not gone. Likes to do PT at the office not at home. Will send a referral. Will get a DEXA. No history of vitamin D deficiency. Fall precautions discussed and the hospital and here today. Does not like a strong medication for pain consequently strategy will be a heating pad, judicious use of Tylenol and ibuprofen. GI precautions discussed. See instructions. Okay to take Flexeril and hydrocodone as prescribed by the hospital if needed. Patient likes to restart driving his car, he actually likes to drive to [place]. I'm concerned about it and let him know that. If he drives needs  frequent stops, stretching and watch for worsening pain. Skin lesion, nose:  previously dx w/ skin cancer, was treated by a dermatologist, would like to see another dermatologist. Refer to Dr. Allyson Sabal. Flu shot today. RTC 09-2017

## 2017-06-23 NOTE — Telephone Encounter (Signed)
Plan of care signed and faxed to [facility] at [phone]. Form sent for scanning.

## 2017-06-30 ENCOUNTER — Other Ambulatory Visit (HOSPITAL_BASED_OUTPATIENT_CLINIC_OR_DEPARTMENT_OTHER): Payer: Medicare HMO

## 2017-06-30 ENCOUNTER — Ambulatory Visit (HOSPITAL_BASED_OUTPATIENT_CLINIC_OR_DEPARTMENT_OTHER)
Admission: RE | Admit: 2017-06-30 | Discharge: 2017-06-30 | Disposition: A | Discharge status: Home / Self Care | Payer: Medicare HMO | Source: Ambulatory Visit | Attending: Internal Medicine | Admitting: Internal Medicine

## 2017-06-30 DIAGNOSIS — Z87891 Personal history of nicotine dependence: Secondary | ICD-10-CM | POA: Diagnosis not present

## 2017-06-30 DIAGNOSIS — R29890 Loss of height: Secondary | ICD-10-CM | POA: Diagnosis not present

## 2017-06-30 DIAGNOSIS — X58XXXA Exposure to other specified factors, initial encounter: Secondary | ICD-10-CM | POA: Insufficient documentation

## 2017-06-30 DIAGNOSIS — Z13820 Encounter for screening for osteoporosis: Secondary | ICD-10-CM | POA: Insufficient documentation

## 2017-06-30 DIAGNOSIS — S32020A Wedge compression fracture of second lumbar vertebra, initial encounter for closed fracture: Secondary | ICD-10-CM | POA: Diagnosis not present

## 2017-06-30 DIAGNOSIS — H353211 Exudative age-related macular degeneration, right eye, with active choroidal neovascularization: Secondary | ICD-10-CM | POA: Diagnosis not present

## 2017-07-03 ENCOUNTER — Encounter: Payer: Self-pay | Admitting: Family Medicine

## 2017-07-03 ENCOUNTER — Ambulatory Visit: Payer: Medicare HMO | Admitting: Rehabilitative and Restorative Service Providers"

## 2017-07-03 ENCOUNTER — Ambulatory Visit (INDEPENDENT_AMBULATORY_CARE_PROVIDER_SITE_OTHER): Payer: Medicare HMO | Admitting: Family Medicine

## 2017-07-03 VITALS — BP 120/82 | HR 102 | Temp 97.8°F | Ht 73.5 in | Wt 206.2 lb

## 2017-07-03 DIAGNOSIS — M4690 Unspecified inflammatory spondylopathy, site unspecified: Secondary | ICD-10-CM | POA: Diagnosis not present

## 2017-07-03 DIAGNOSIS — M7062 Trochanteric bursitis, left hip: Secondary | ICD-10-CM | POA: Diagnosis not present

## 2017-07-03 DIAGNOSIS — M47819 Spondylosis without myelopathy or radiculopathy, site unspecified: Secondary | ICD-10-CM

## 2017-07-03 DIAGNOSIS — M7061 Trochanteric bursitis, right hip: Secondary | ICD-10-CM

## 2017-07-03 MED ORDER — METHYLPREDNISOLONE ACETATE 80 MG/ML IJ SUSP: 80.0000 mg | Freq: Once | INTRAMUSCULAR | Status: DC

## 2017-07-03 MED ORDER — METHYLPREDNISOLONE ACETATE 40 MG/ML IJ SUSP
40.0000 mg | Freq: Once | INTRAMUSCULAR | Status: AC
  Administered 2017-07-03: 40 mg via INTRAMUSCULAR

## 2017-07-03 NOTE — Progress Notes (Signed)
Pre visit review using our clinic review tool, if applicable. No additional management support is needed unless otherwise documented below in the visit note. 

## 2017-07-03 NOTE — Patient Instructions (Addendum)
If you do not hear anything about your referral in the next 1-2 weeks, call our office and ask for an update.  Vit D 800 units daily and calcium 1200 mg daily.   Let us know if you need anything.

## 2017-07-03 NOTE — Progress Notes (Signed)
Chief Complaint  Patient presents with  . Bursitis    Both hips    Subjective: Patient is a 76 y.o. male here for b/l hip pain.  He has a bilateral greater trochanteric bursitis. He has received 2 rounds of injections that helped immensely. His last injection was 4 months ago. He recently wore off.  The patient also has been expressing lower back pain. He recently had a scan that showed arthritis in his lower back. He is interested in injections. It is affecting his daily life. He denies any numbness, tingling, or loss of bowel/bladder function.   ROS: MSK: +back pain, +b/l hip pain  Family History  Problem Relation Age of Onset  . Breast cancer Mother   . Colon cancer Mother 63       died 66  . Aneurysm Father   . Heart attack Father        83  . Aneurysm Son   . Stroke Paternal Grandfather   . Cancer Paternal Grandmother   . Cancer Maternal Grandmother   . Prostate cancer Neg Hx   . Esophageal cancer Neg Hx   . Pancreatic cancer Neg Hx   . Rectal cancer Neg Hx   . Stomach cancer Neg Hx    Past Medical History:  Diagnosis Date  . Cataract   . COPD (chronic obstructive pulmonary disease) ([place])   . Diverticulosis    pt states he had diverticuli in his bladder also.  . H/O [facility] spotted fever 01/27/2014  . Hematuria    over 5 years ago  . Hernia of abdominal cavity   . History of rectal polyps   . Hyperplastic colon polyp   . Hypertension   . Macular degeneration   . Rectal cancer ([place]) 1994  . Shoulder pain, left    per pt, he can lay on the left side!  . Tubular adenoma    01/05/2013   No Known Allergies  Current Outpatient Prescriptions:  .  acetaminophen (TYLENOL) 500 MG tablet, Take 1,000 mg by mouth every 6 (six) hours as needed for mild pain., Disp: , Rfl:  .  bisacodyl (DULCOLAX) 5 MG EC tablet, Take 1 tablet (5 mg total) by mouth daily as needed for moderate constipation., Disp: 30 tablet, Rfl: 0 .  calcium carbonate (OS-CAL) 600 MG TABS  tablet, Take 1 tablet (600 mg total) by mouth daily with breakfast. + vitamin D, Disp: 60 tablet, Rfl:  .  cyclobenzaprine (FLEXERIL) 5 MG tablet, Take 1 tablet (5 mg total) by mouth 3 (three) times daily as needed for muscle spasms., Disp: 30 tablet, Rfl: 0 .  HYDROcodone-acetaminophen (NORCO/VICODIN) 5-325 MG tablet, Take 1-2 tablets by mouth every 6 (six) hours as needed for moderate pain., Disp: 30 tablet, Rfl: 0 .  Multiple Vitamins-Minerals (50+ ADULT EYE HEALTH PO), Take 1 capsule by mouth daily. , Disp: , Rfl:  .  nortriptyline (PAMELOR) 50 MG capsule, Take 1 capsule (50 mg total) by mouth at bedtime. (Patient taking differently: Take 50 mg by mouth at bedtime as needed (sleep). ), Disp: 90 capsule, Rfl: 1 .  omeprazole (PRILOSEC) 20 MG capsule, Take 1 capsule (20 mg total) by mouth daily. (Patient taking differently: Take 20 mg by mouth daily as needed (heartburn). ), Disp: 90 capsule, Rfl: 2 .  polyethylene glycol (MIRALAX / GLYCOLAX) packet, Take 17 g by mouth 2 (two) times daily., Disp: 14 each, Rfl: 0  Objective: BP 120/82 (BP Location: Left Arm, Patient Position: Sitting, Cuff Size:  Normal)   Pulse (!) 102   Temp 97.8 F (36.6 C) (Oral)   Ht 6' 1.5" (1.867 m)   Wt 206 lb 4 oz (93.6 kg)   SpO2 95%   BMI 26.84 kg/m  General: Awake, appears stated age Lungs: No accessory muscle use MSK: +TTP over b/l greater troch region over bursa Psych: Age appropriate judgment and insight, normal affect and mood  Procedure note: Greater trochanteric bursa injection, b/l Verbal consent obtained. The area of interest was palpated and demarcated with an otoscope speculum. It was cleaned with an alcohol swab. A 27 g needle was inserted at a perpendicular angle through the area of interest. The plunger was withdrawn to ensure our placement was not in a vessel. 2 mL of 1% lidocaine without epi and 40 mg of Depomedrol was injected. A bandaid was placed. This was repeated on the opposite side. The  patient tolerated the procedure well.  There were no complications noted.  He reported immediate improvement.  Assessment and Plan: Arthritis of low back ([place]) - Plan: Ambulatory referral to Pain Clinic  Greater trochanteric bursitis of both hips - Plan: PR DRAIN/INJECT LARGE JOINT/BURSA  Orders as above. Refer to pain management for possible steroid injections. He does well w injections, hopefully he does well today. Vit D and Ca rec'd w hx of vert compression fx.  F/u prn otherwise.  The patient voiced understanding and agreement to the plan.  [place], DO 07/03/17  2:47 PM

## 2017-07-08 ENCOUNTER — Encounter: Payer: Self-pay | Admitting: Rehabilitative and Restorative Service Providers"

## 2017-07-08 ENCOUNTER — Ambulatory Visit (INDEPENDENT_AMBULATORY_CARE_PROVIDER_SITE_OTHER): Payer: Medicare HMO | Admitting: Rehabilitative and Restorative Service Providers"

## 2017-07-08 DIAGNOSIS — R29898 Other symptoms and signs involving the musculoskeletal system: Secondary | ICD-10-CM | POA: Diagnosis not present

## 2017-07-08 DIAGNOSIS — M545 Low back pain, unspecified: Secondary | ICD-10-CM

## 2017-07-08 NOTE — Therapy (Addendum)
[place] [address] [address], [place], [address] Phone: [phone]   Fax:  [phone]  Physical Therapy Evaluation  Patient Details  Name: Devin [name] MRN: [id number] Date of Birth: Sep 24, 1941 Referring Provider: Dr Shelda Pal   Encounter Date: 07/08/2017  Time in 10:15 Time out: 11:17 Total treatment time 62 min       PT End of Session - 07/08/17 1240    Number of Visits 6    Visit # 1 Total visits - 12  POC - 08/21/17  Past Medical History:  Diagnosis Date  . Cataract   . COPD (chronic obstructive pulmonary disease) ([place])   . Diverticulosis    pt states he had diverticuli in his bladder also.  . H/O [facility] spotted fever 01/27/2014  . Hematuria    over 5 years ago  . Hernia of abdominal cavity   . History of rectal polyps   . Hyperplastic colon polyp   . Hypertension   . Macular degeneration   . Rectal cancer ([place]) 1994  . Shoulder pain, left    per pt, he can lay on the left side!  . Tubular adenoma    01/05/2013    Past Surgical History:  Procedure Laterality Date  . APPENDECTOMY    . COLON SURGERY     rectal cancer ~ 1999  . EYE SURGERY     cataract sx  . HERNIA REPAIR    . IR KYPHO LUMBAR INC FX REDUCE BONE BX UNI/BIL CANNULATION INC/IMAGING  06/12/2017  . mastoid gland removal     left ear  . PROSTATE SURGERY  05/2015  . TONSILLECTOMY      There were no vitals filed for this visit.       Subjective Assessment - 07/08/17 1024    Subjective Patient reports that he is doing well following back surgery following lumbar fx L2 05/31/17 and underwent surgery 06/08/17. He now has increased arthritis in the back and hips. He has returned to the gym for exercise. Would like to come in for a few visits to be sure he has the new exercises right, then continue with HEP and gym program independently.    Pertinent History double hernia post surgery ~25 yrs ago; rectal cancer 17  yrs ago; prostate sx 2017   Diagnostic tests xrays    Patient Stated Goals learn exercises for the gym - does not think he needs to be in therapy    Currently in Pain? Yes   Pain Score 2    Pain Location Hip   Pain Orientation Right;Left   Pain Descriptors / Indicators Aching   Pain Type Chronic pain   Pain Radiating Towards at times down into the LE's    Pain Frequency Intermittent   Aggravating Factors  weather; diet; sleeping on poor mattress; too much time in the garden; leaning forward    Pain Relieving Factors heated seat in car; moist heat; OTC meds             [facility] PT Assessment - 07/08/17 0001      Assessment   Medical Diagnosis LBP: bilat greater trochanteric bursitis    Referring Provider Dr Crosby Oyster Wendling    Onset Date/Surgical Date 05/31/17   Hand Dominance Right   Next MD Visit PRN    Prior Therapy in hospital      Precautions   Precautions None     Balance Screen   Has the patient fallen  in the past 6 months Yes   How many times? 1   Has the patient had a decrease in activity level because of a fear of falling?  No   Is the patient reluctant to leave their home because of a fear of falling?  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    Leisure gardening; wood working; Runner, broadcasting/film/video; home decorating; travel      Observation/Other Assessments   Focus on Therapeutic Outcomes (FOTO)  39% limitation      Sensation   Additional Comments WFL's per pt report      Posture/Postural Control   Posture Comments head forward; shoudlers rounded; flexed forward at hips      AROM   Lumbar Flexion 80%   Lumbar Extension 40%   Lumbar - Right Side Bend 75%   Lumbar - Left Side Bend 75%   Lumbar - Right Rotation --     Strength   Overall Strength Comments 5/5 bilat LE      Palpation   Palpation comment tightness bilat piriformis; hip  abductors             Objective measurements completed on examination: See above findings.          [facility] PT Treatment/Exercise - 07/08/17 0001      Self-Care   Self-Care --  education re- back care and PT      Lumbar Exercises: Stretches   Passive Hamstring Stretch 3 reps;30 seconds  supine with strap    Standing Extension 3 reps  2-3 sec hold    ITB Stretch 2 reps;30 seconds  supine with strap    Piriformis Stretch 3 reps;30 seconds  supine travell varying angles      Moist Heat Therapy   Number Minutes Moist Heat 20 Minutes   Moist Heat Location Lumbar Spine     Electrical Stimulation   Electrical Stimulation Location bilat lumbar   Electrical Stimulation Action IFC   Electrical Stimulation Parameters to tolerance   Electrical Stimulation Goals Pain;Tone                PT Education - 07/08/17 1057    Education provided Yes   Education Details HEP TENS body mechanics    Person(s) Educated Patient   Methods Explanation;Demonstration;Tactile cues;Verbal cues;Handout   Comprehension Verbalized understanding;Returned demonstration;Verbal cues required;Tactile cues required             PT Long Term Goals - 07/08/17 1247      PT LONG TERM GOAL #1   Title Improve moblity and ROM thorugh lumbar sine and bilat hips demonstrated with correct stretch for hamstrings and hip musculature 08/18/17   Time 6   Period Weeks   Status New     PT LONG TERM GOAL #2   Title Patient reports confidence in independent HEP 08/18/17    Time 6   Period Weeks   Status New     PT LONG TERM GOAL #3   Title Improve FOTO to 29% limitation 11/20//18   Time 6   Period Weeks                Plan - 07/08/17 1240    Clinical Impression Statement Devin [name] presents with 2 month history of LBP and bilat hip pain. Symtpoms are excerbated from previous problems following a fall and subsequent  lumbar surgery for kyphoplasty 06/12/17. He has forward posture;  limited trunk and LE mobilty/ROM; muscular tightness through the lumar spine and bilat hips; pain on an intermittent basis; limited functional activity level due to pain. Devin [name] will benefit from PT to address problems identified.    History and Personal Factors relevant to plan of care: abdominal mess for bilat hernia repair  - poor core stability    Clinical Presentation Stable   Clinical Decision Making Low   Rehab Potential Good   PT Frequency 1x / week   PT Duration 6 weeks   PT Treatment/Interventions Patient/family education;ADLs/Self Care Home Management;Cryotherapy;Electrical Stimulation;Iontophoresis 4mg /ml Dexamethasone;Moist Heat;Ultrasound;Dry needling;Manual techniques;Therapeutic activities;Therapeutic exercise;Neuromuscular re-education   PT Next Visit Plan review HEP; add calf stretch; check hip flexor stretch sitting and quads prone; progress with core stabilization with plan to work toward independent gym program in 1-2 additional visits; modalities as indicated.    Consulted and Agree with Plan of Care Patient      Patient will benefit from skilled therapeutic intervention in order to improve the following deficits and impairments:  Postural dysfunction, Improper body mechanics, Pain, Increased fascial restricitons, Increased muscle spasms, Decreased mobility, Decreased range of motion, Decreased activity tolerance  Visit Diagnosis: Acute right-sided low back pain without sciatica - Plan: PT plan of care cert/re-cert  Other symptoms and signs involving the musculoskeletal system - Plan: PT plan of care cert/re-cert      [facility] PT PB G-CODES - 2017-07-30 1251    Functional Assessment Tool Used  Evaluation; FOTO; clinical assessment    Functional Limitations Mobility: Walking and moving around   Mobility: Walking and Moving Around Current Status At least 40 percent but less than 60 percent impaired, limited or restricted   Mobility: Walking and Moving Around Goal Status [id number])  At least 20 percent but less than 40 percent impaired, limited or restricted       Problem List Patient Active Problem List   Diagnosis Date Noted  . Back pain   . Compression fracture of L2 ([place]) 06/07/2017  . Insomnia 10/14/2016  . DJD (degenerative joint disease) 10/14/2016  . PCP NOTES >>>>>>>>>>>>>>>>>>>>>>>>> 11/28/2015  . Benign prostatic hyperplasia with urinary obstruction 05/30/2015  . Fungal dermatitis 02/23/2015  . Annual physical exam 11/27/2014  . Essential hypertension 10/02/2014  . Alcohol use disorder, mild, abuse 10/02/2014  . Bruit 08/09/2014  . Medication management 07/27/2014  . GERD (gastroesophageal reflux disease) 05/25/2014  . History of rectal cancer 05/15/2014  . Personal history of colonic polyps 05/15/2014  . Diverticulosis of colon (without mention of hemorrhage) 05/15/2014  . Family history of malignant neoplasm of gastrointestinal tract 05/15/2014  . Depression 04/19/2014    Katey Barrie Nilda Simmer PT, MPH  July 30, 2017, 12:55 PM  [facility] [place], [place], [address] Phone: [phone]   Fax:  [phone]  Name: Devin [name] MRN: [id number] Date of Birth: 09-07-1941

## 2017-07-08 NOTE — Patient Instructions (Signed)
Backward Bend (Standing)    Arch backward to make hollow of back deeper. Hold _2-3___ seconds. Repeat __2-3__ times per set. Do _several___ sessions per day.    HIP: Hamstrings - Supine  Place strap around foot. Raise leg up, keeping knee straight.  Bend opposite knee to protect back if indicated. Hold 30 seconds. 3 reps per set, 1-2 sets per day   Outer Hip Stretch: Reclined IT Band Stretch (Strap)   Strap around one foot, pull leg across body until you feel a pull or stretch in the outside of your hip, with shoulders on mat. Hold for 30 seconds. Repeat 3 times each leg. 1-2 times/day.  Piriformis Stretch   Lying on back, pull right knee toward opposite shoulder. Hold 30 seconds. Repeat 3 times. Do 1-2 sessions per day.   TENS UNIT: This is helpful for muscle pain and spasm.   Search and Purchase a TENS 7000 2nd edition at www.tenspros.com. It should be less than $30.     TENS unit instructions: Do not shower or bathe with the unit on Turn the unit off before removing electrodes or batteries If the electrodes lose stickiness add a drop of water to the electrodes after they are disconnected from the unit and place on plastic sheet. If you continued to have difficulty, call the TENS unit company to purchase more electrodes. Do not apply lotion on the skin area prior to use. Make sure the skin is clean and dry as this will help prolong the life of the electrodes. After use, always check skin for unusual red areas, rash or other skin difficulties. If there are any skin problems, does not apply electrodes to the same area. Never remove the electrodes from the unit by pulling the wires. Do not use the TENS unit or electrodes other than as directed. Do not change electrode placement without consultating your therapist or physician. Keep 2 fingers with between each electrode.   Sleeping on Back  Place pillow under knees. A pillow with cervical support and a roll around  waist are also helpful. Copyright  VHI. All rights reserved.  Sleeping on Side Place pillow between knees. Use cervical support under neck and a roll around waist as needed. Copyright  VHI. All rights reserved.   Sleeping on Stomach   If this is the only desirable sleeping position, place pillow under lower legs, and under stomach or chest as needed.  Posture - Sitting   Sit upright, head facing forward. Try using a roll to support lower back. Keep shoulders relaxed, and avoid rounded back. Keep hips level with knees. Avoid crossing legs for long periods. Stand to Sit / Sit to Stand   To sit: Bend knees to lower self onto front edge of chair, then scoot back on seat. To stand: Reverse sequence by placing one foot forward, and scoot to front of seat. Use rocking motion to stand up.   Work Height and Reach  Ideal work height is no more than 2 to 4 inches below elbow level when standing, and at elbow level when sitting. Reaching should be limited to arm's length, with elbows slightly bent.  Bending  Bend at hips and knees, not back. Keep feet shoulder-width apart.    Posture - Standing   Good posture is important. Avoid slouching and forward head thrust. Maintain curve in low back and align ears over shoul- ders, hips over ankles.  Alternating Positions   Alternate tasks and change positions frequently to reduce fatigue and muscle  tension. Take rest breaks. Computer Work   Position work to Programmer, multimedia. Use proper work and seat height. Keep shoulders back and down, wrists straight, and elbows at right angles. Use chair that provides full back support. Add footrest and lumbar roll as needed.  Getting Into / Out of Car  Lower self onto seat, scoot back, then bring in one leg at a time. Reverse sequence to get out.  Dressing  Lie on back to pull socks or slacks over feet, or sit and bend leg while keeping back straight.    Housework - Sink  Place one foot on ledge of  cabinet under sink when standing at sink for prolonged periods.   Pushing / Pulling  Pushing is preferable to pulling. Keep back in proper alignment, and use leg muscles to do the work.  Deep Squat   Squat and lift with both arms held against upper trunk. Tighten stomach muscles without holding breath. Use smooth movements to avoid jerking.  Avoid Twisting   Avoid twisting or bending back. Pivot around using foot movements, and bend at knees if needed when reaching for articles.  Carrying Luggage   Distribute weight evenly on both sides. Use a cart whenever possible. Do not twist trunk. Move body as a unit.   Lifting Principles .Maintain proper posture and head alignment. .Slide object as close as possible before lifting. .Move obstacles out of the way. .Test before lifting; ask for help if too heavy. .Tighten stomach muscles without holding breath. .Use smooth movements; do not jerk. .Use legs to do the work, and pivot with feet. .Distribute the work load symmetrically and close to the center of trunk. .Push instead of pull whenever possible.   Ask For Help   Ask for help and delegate to others when possible. Coordinate your movements when lifting together, and maintain the low back curve.  Log Roll   Lying on back, bend left knee and place left arm across chest. Roll all in one movement to the right. Reverse to roll to the left. Always move as one unit. Housework - Sweeping  Use long-handled equipment to avoid stooping.   Housework - Wiping  Position yourself as close as possible to reach work surface. Avoid straining your back.  Laundry - Unloading Wash   To unload small items at bottom of washer, lift leg opposite to arm being used to reach.  Lutz close to area to be raked. Use arm movements to do the work. Keep back straight and avoid twisting.     Cart  When reaching into cart with one arm, lift opposite leg to keep back  straight.   Getting Into / Out of Bed  Lower self to lie down on one side by raising legs and lowering head at the same time. Use arms to assist moving without twisting. Bend both knees to roll onto back if desired. To sit up, start from lying on side, and use same move-ments in reverse. Housework - Vacuuming  Hold the vacuum with arm held at side. Step back and forth to move it, keeping head up. Avoid twisting.   Laundry - IT consultant so that bending and twisting can be avoided.   Laundry - Unloading Dryer  Squat down to reach into clothes dryer or use a reacher.  Gardening - Weeding / Probation officer or Kneel. Knee pads may be helpful.

## 2017-07-13 ENCOUNTER — Encounter: Payer: Self-pay | Admitting: Rehabilitation

## 2017-07-13 ENCOUNTER — Ambulatory Visit (INDEPENDENT_AMBULATORY_CARE_PROVIDER_SITE_OTHER): Payer: Medicare HMO | Admitting: Rehabilitation

## 2017-07-13 DIAGNOSIS — M545 Low back pain, unspecified: Secondary | ICD-10-CM

## 2017-07-13 DIAGNOSIS — R29898 Other symptoms and signs involving the musculoskeletal system: Secondary | ICD-10-CM
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
## 2017-07-14 ENCOUNTER — Encounter: Payer: Self-pay | Admitting: Internal Medicine

## 2017-07-15 ENCOUNTER — Ambulatory Visit (INDEPENDENT_AMBULATORY_CARE_PROVIDER_SITE_OTHER): Payer: Medicare HMO | Admitting: Physical Therapy

## 2017-07-15 DIAGNOSIS — M545 Low back pain, unspecified: Secondary | ICD-10-CM

## 2017-07-15 DIAGNOSIS — R29898 Other symptoms and signs involving the musculoskeletal system: Secondary | ICD-10-CM

## 2017-07-15 NOTE — Therapy (Signed)
[place], [place], [address] Phone: [phone]   Fax:  [phone]  Physical Therapy Treatment  Patient Details  Name: Devin [name] MRN: [id number] Date of Birth: 1941/05/22 Referring Provider: Dr. Riki Sheer  Encounter Date: 07/15/2017      PT End of Session - 07/15/17 1100    Visit Number 3   Number of Visits 6   Date for PT Re-Evaluation 08/19/17   PT Start Time 1017   PT Stop Time 1100   PT Time Calculation (min) 43 min      Past Medical History:  Diagnosis Date  . Cataract   . COPD (chronic obstructive pulmonary disease) ([place])   . Diverticulosis    pt states he had diverticuli in his bladder also.  . H/O [facility] spotted fever 01/27/2014  . Hematuria    over 5 years ago  . Hernia of abdominal cavity   . History of rectal polyps   . Hyperplastic colon polyp   . Hypertension   . Macular degeneration   . Rectal cancer ([place]) 1994  . Shoulder pain, left    per pt, he can lay on the left side!  . Tubular adenoma    01/05/2013    Past Surgical History:  Procedure Laterality Date  . APPENDECTOMY    . COLON SURGERY     rectal cancer ~ 1999  . EYE SURGERY     cataract sx  . HERNIA REPAIR    . IR KYPHO LUMBAR INC FX REDUCE BONE BX UNI/BIL CANNULATION INC/IMAGING  06/12/2017  . mastoid gland removal     left ear  . PROSTATE SURGERY  05/2015  . TONSILLECTOMY      There were no vitals filed for this visit.      Subjective Assessment - 07/15/17 1022    Subjective Pt reports he may have overdone it at gym yesterday with equip.  Could hardly walk afterwards.  He likes the stretches given for HEP.    Patient Stated Goals learn exercises for the gym - does not think he needs to be in therapy    Currently in Pain? No/denies  just stiffness.             [facility] PT Assessment - 07/15/17 0001      Assessment   Medical Diagnosis LBP: bilat greater trochanteric  bursitis    Referring Provider Dr. Riki Sheer   Onset Date/Surgical Date 05/31/17   Hand Dominance Right   Next MD Visit PRN    Prior Therapy in hospital      Flexibility   Soft Tissue Assessment /Muscle Length yes   Quadriceps bilat knee flexio of 115, prone with strap          [facility] Adult PT Treatment/Exercise - 07/15/17 0001      Lumbar Exercises: Stretches   Nutritional therapist Limitations 2 reps in prone, 1 in standing.      Lumbar Exercises: Aerobic   Stationary Bike L3: 5 min      Lumbar Exercises: Standing   Other Standing Lumbar Exercises Split squats with limited depth (UE support on counter) x 10 each leg.    Other Standing Lumbar Exercises bilat high kneeling on 3" pad with simulation of gardening, and how to get up to standing.      Lumbar Exercises: Seated   Sit to Stand 10 reps  eccentric lowering   Sit to Stand Limitations  Seated:  core engagement with hands pressing down into PTA's - 10 sec hold x 8 reps.      Knee/Hip Exercises: Stretches   Gastroc Stretch Right;Left;2 reps;30 seconds     Knee/Hip Exercises: Machines for Strengthening   Total Gym Leg Press 6 plates, 20 reps.  VC for speed of exercise.                      PT Long Term Goals - 07/13/17 1018      PT LONG TERM GOAL #1   Title Improve moblity and ROM thorugh lumbar sine and bilat hips demonstrated with correct stretch for hamstrings and hip musculature 08/18/17   Status On-going     PT LONG TERM GOAL #2   Title Patient reports confidence in independent HEP 08/18/17    Status On-going     PT LONG TERM GOAL #3   Title Improve FOTO to 29% limitation 11/20//18   Status On-going               Plan - 07/15/17 1340    Clinical Impression Statement Pt tolerated all exercises well, reporting reduction of stiffness in body without increase in pain.  Pt is interested in returning to gardening and would like to know how to strengthen body for this.   Pt making good progress towards established goals.    Rehab Potential Good   PT Frequency 1x / week   PT Duration 6 weeks   PT Treatment/Interventions Patient/family education;ADLs/Self Care Home Management;Cryotherapy;Electrical Stimulation;Iontophoresis 4mg /ml Dexamethasone;Moist Heat;Ultrasound;Dry needling;Manual techniques;Therapeutic activities;Therapeutic exercise;Neuromuscular re-education   PT Next Visit Plan Add functional strenghtening exercises, along with core work.     Consulted and Agree with Plan of Care Patient      Patient will benefit from skilled therapeutic intervention in order to improve the following deficits and impairments:  Postural dysfunction, Improper body mechanics, Pain, Increased fascial restricitons, Increased muscle spasms, Decreased mobility, Decreased range of motion, Decreased activity tolerance  Visit Diagnosis: Acute right-sided low back pain without sciatica  Other symptoms and signs involving the musculoskeletal system     Problem List Patient Active Problem List   Diagnosis Date Noted  . Back pain   . Compression fracture of L2 ([place]) 06/07/2017  . Insomnia 10/14/2016  . DJD (degenerative joint disease) 10/14/2016  . PCP NOTES >>>>>>>>>>>>>>>>>>>>>>>>> 11/28/2015  . Benign prostatic hyperplasia with urinary obstruction 05/30/2015  . Fungal dermatitis 02/23/2015  . Annual physical exam 11/27/2014  . Essential hypertension 10/02/2014  . Alcohol use disorder, mild, abuse 10/02/2014  . Bruit 08/09/2014  . Medication management 07/27/2014  . GERD (gastroesophageal reflux disease) 05/25/2014  . History of rectal cancer 05/15/2014  . Personal history of colonic polyps 05/15/2014  . Diverticulosis of colon (without mention of hemorrhage) 05/15/2014  . Family history of malignant neoplasm of gastrointestinal tract 05/15/2014  . Depression 04/19/2014   Kerin Perna, PTA 07/15/17 1:43 PM  [facility]  [place], [place], [address] Phone: [phone]   Fax:  [phone]  Name: ROURKE MCQUITTY MRN: [id number] Date of Birth: 07/14/1941

## 2017-07-15 NOTE — Patient Instructions (Signed)
Quadriceps (Stand)    Stand holding onto stationary object. Grasp inside of back of shoe. Pull knee back. Keep back straight. Do not rotate or arch back. Hold __30__ seconds. Repeat __2__ times. Do _1-2___ sessions per day. CAUTION: Stretch should be gentle, steady and slow.  Calf Stretch    Place hands on wall at shoulder height. Keeping back leg straight, bend front leg, feet pointing forward, heels flat on floor. Lean forward slightly until stretch is felt in calf of back leg. Hold stretch _30__ seconds, breathing slowly in and out. Repeat stretch with other leg back. 2 reps. Do _1-2__ sessions per day. Variation: Use chair or table for support.  Squat: Back Split    HOLD ONTO COUNTER In wide stride-stance, hold weight on back of shoulders. Trunk stable, chest and head up, squat straight down. Do not lunge. Front knee over (not ahead of) front foot. Hold momentarily. Return slowly. Repeat _10__ times. SIT TO STAND: No Device    Sit with feet shoulder-width apart, on floor. Lean chest forward, raise hips up from surface. Straighten hips and knees. Weight bear equally on left and right sides. __10_ reps per set, _2__ sets per day, _5__ days per week Place left leg closer to sitting surface.  * pushing on steering wheel while pulling navel in, hold 5 seconds, 5-10 reps.    [facility] at [facility] [place], [place] [address]  [phone] (office) [phone] (fax)

## 2017-07-21 ENCOUNTER — Other Ambulatory Visit: Payer: Medicare HMO

## 2017-07-22 ENCOUNTER — Encounter: Payer: Medicare HMO | Admitting: Physical Therapy

## 2017-07-24 ENCOUNTER — Encounter: Payer: Self-pay | Admitting: Rehabilitative and Restorative Service Providers"

## 2017-07-24 ENCOUNTER — Encounter: Payer: Medicare HMO | Admitting: Rehabilitative and Restorative Service Providers"

## 2017-07-24 ENCOUNTER — Ambulatory Visit (INDEPENDENT_AMBULATORY_CARE_PROVIDER_SITE_OTHER): Payer: Medicare HMO | Admitting: Physical Therapy

## 2017-07-24 DIAGNOSIS — M25612 Stiffness of left shoulder, not elsewhere classified: Secondary | ICD-10-CM | POA: Diagnosis not present

## 2017-07-24 DIAGNOSIS — M6281 Muscle weakness (generalized): Secondary | ICD-10-CM | POA: Diagnosis not present

## 2017-07-24 DIAGNOSIS — M25512 Pain in left shoulder: Secondary | ICD-10-CM

## 2017-07-24 DIAGNOSIS — G8929 Other chronic pain: Secondary | ICD-10-CM | POA: Diagnosis not present

## 2017-07-24 DIAGNOSIS — R29898 Other symptoms and signs involving the musculoskeletal system: Secondary | ICD-10-CM

## 2017-07-24 DIAGNOSIS — M545 Low back pain, unspecified: Secondary | ICD-10-CM

## 2017-07-24 NOTE — Patient Instructions (Signed)
   Over Head Pull: Narrow Grip        On back, knees bent, feet flat, band across thighs, elbows straight but relaxed. Pull hands apart (start). Keeping elbows straight, bring arms up and over head, hands toward floor. Keep pull steady on band. Hold momentarily. Return slowly, keeping pull steady, back to start. Repeat _10-20__ times. Band color __red____   Side Pull: Double Arm   On back, knees bent, feet flat. Arms perpendicular to body, shoulder level, elbows straight but relaxed. Pull arms out to sides, elbows straight. Resistance band comes across collarbones, hands toward floor. Hold momentarily. Slowly return to starting position. Repeat _10-20__ times. Band color __red___   Elmer Picker   On back, knees bent, feet flat, left hand on left hip, right hand above left. Pull right arm DIAGONALLY (hip to shoulder) across chest. Bring right arm along head toward floor. Hold momentarily. Slowly return to starting position. Repeat _10-20__ times. Do with left arm. Band color ___red___   Shoulder Rotation: Double Arm   On back, knees bent, feet flat, elbows tucked at sides, bent 90, hands palms up. Pull hands apart and down toward floor, keeping elbows near sides. Hold momentarily. Slowly return to starting position. Repeat 10-_20__ times. Band color _red_____
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
## 2017-07-28 ENCOUNTER — Encounter: Payer: Medicare HMO | Admitting: Physical Therapy

## 2017-08-06 ENCOUNTER — Ambulatory Visit
Admission: RE | Admit: 2017-08-06 | Discharge: 2017-08-06 | Disposition: A | Discharge status: Home / Self Care | Payer: Medicare HMO | Source: Ambulatory Visit | Attending: Physician Assistant | Admitting: Physician Assistant

## 2017-08-06 ENCOUNTER — Ambulatory Visit (INDEPENDENT_AMBULATORY_CARE_PROVIDER_SITE_OTHER): Payer: Medicare HMO | Admitting: Rehabilitative and Restorative Service Providers"

## 2017-08-06 ENCOUNTER — Encounter: Payer: Self-pay | Admitting: Rehabilitative and Restorative Service Providers"

## 2017-08-06 DIAGNOSIS — M545 Low back pain, unspecified: Secondary | ICD-10-CM

## 2017-08-06 DIAGNOSIS — Z8781 Personal history of (healed) traumatic fracture: Secondary | ICD-10-CM | POA: Diagnosis not present

## 2017-08-06 DIAGNOSIS — M6281 Muscle weakness (generalized): Secondary | ICD-10-CM

## 2017-08-06 DIAGNOSIS — M25512 Pain in left shoulder: Secondary | ICD-10-CM

## 2017-08-06 DIAGNOSIS — S32010A Wedge compression fracture of first lumbar vertebra, initial encounter for closed fracture: Secondary | ICD-10-CM

## 2017-08-06 DIAGNOSIS — G8929 Other chronic pain: Secondary | ICD-10-CM | POA: Diagnosis not present

## 2017-08-06 DIAGNOSIS — M25612 Stiffness of left shoulder, not elsewhere classified: Secondary | ICD-10-CM

## 2017-08-06 DIAGNOSIS — R29898 Other symptoms and signs involving the musculoskeletal system: Secondary | ICD-10-CM

## 2017-08-06 HISTORY — PX: IR RADIOLOGIST EVAL & MGMT: IMG5224

## 2017-08-06 NOTE — Patient Instructions (Signed)
Scapula Adduction With Pectoralis Stretch: Low - Standing   Shoulders at 45 hands even with shoulders, keeping weight through legs, shift weight forward until you feel pull or stretch through the front of your chest. Hold _30__ seconds. Do _3__ times, _2-4__ times per day.   Scapula Adduction With Pectoralis Stretch: Mid-Range - Standing   Shoulders at 90 elbows even with shoulders, keeping weight through legs, shift weight forward until you feel pull or strength through the front of your chest. Hold __30_ seconds. Do _3__ times, __2-4_ times per day.   Scapula Adduction With Pectoralis Stretch: High - Standing   Shoulders at 120 hands up high on the doorway, keeping weight on feet, shift weight forward until you feel pull or stretch through the front of your chest. Hold _30__ seconds. Do _3__ times, _2-3__ times per day.   Axial Extension (Chin Tuck)    Pull chin in and lengthen back of neck. Hold __5__ seconds while counting out loud. Repeat __10__ times. Do __several__ sessions per day.  Shoulder Blade Squeeze    Rotate shoulders back, then squeeze shoulder blades together. Repeat ____ times. Do ____ sessions per day.  Upper Back Strength: Lower Trapezius / Rotator Cuff " L's "     Arms in waitress pose, palms up. Press hands back and slide shoulder blades down. Hold for __5__ seconds. Repeat _10___ times. 1-2 times per day.    Scapular Retraction: Elbow Flexion (Standing)  "W's"     With elbows bent to 90, pinch shoulder blades together and rotate arms out, keeping elbows bent. Repeat __10__ times per set. Do __1-2__ sets per session. Do _several ___ sessions per day.   Marland KitchenResisted External Rotation: in Neutral - Bilateral   PALMS UP Sit or stand, tubing in both hands, elbows at sides, bent to 90, forearms forward. Pinch shoulder blades together and rotate forearms out. Keep elbows at sides. Repeat __10__ times per set. Do _2-3___ sets per session. Do  _2-3___ sessions per day.   Low Row: Standing   Face anchor, feet shoulder width apart. Palms up, pull arms back, squeezing shoulder blades together. Repeat 10__ times per set. Do 2-3__ sets per session. Do 1__ sessions per day. Anchor Height: Waist     Strengthening: Resisted Extension   Hold tubing in right hand, arm forward. Pull arm back, elbow straight. Repeat _10___ times per set. Do 2-3____ sets per session. Do 1__ sessions per day.

## 2017-08-06 NOTE — Therapy (Signed)
[place], [place], [address] Phone: [phone]   Fax:  [phone]  Physical Therapy Treatment  Patient Details  Name: Devin [name] MRN: [id number] Date of Birth: 1940/12/18 Referring Provider: Dr Riki Sheer    Encounter Date: 08/06/2017  PT End of Session - 08/06/17 1407    Visit Number  5    Number of Visits  10    Date for PT Re-Evaluation  08/28/17    PT Start Time  1962    PT Stop Time  1456    PT Time Calculation (min)  52 min    Activity Tolerance  Patient tolerated treatment well       Past Medical History:  Diagnosis Date  . Cataract   . COPD (chronic obstructive pulmonary disease) ([place])   . Diverticulosis    pt states he had diverticuli in his bladder also.  . H/O [facility] spotted fever 01/27/2014  . Hematuria    over 5 years ago  . Hernia of abdominal cavity   . History of rectal polyps   . Hyperplastic colon polyp   . Hypertension   . Macular degeneration   . Rectal cancer ([place]) 1994  . Shoulder pain, left    per pt, he can lay on the left side!  . Tubular adenoma    01/05/2013    Past Surgical History:  Procedure Laterality Date  . APPENDECTOMY    . COLON SURGERY     rectal cancer ~ 1999  . EYE SURGERY     cataract sx  . HERNIA REPAIR    . IR KYPHO LUMBAR INC FX REDUCE BONE BX UNI/BIL CANNULATION INC/IMAGING  06/12/2017  . mastoid gland removal     left ear  . PROSTATE SURGERY  05/2015  . TONSILLECTOMY      There were no vitals filed for this visit.  Subjective Assessment - 08/06/17 1408    Subjective  Back and shoulders are doing well. He is doing his exercises at home and it has made improvement.     Currently in Pain?  No/denies         [facility] PT Assessment - 08/06/17 0001      Assessment   Medical Diagnosis  LBP: bilat greater trochanteric bursitis, Lt shoulder pain     Referring Provider  Dr Riki Sheer     Onset Date/Surgical  Date  05/31/17    Hand Dominance  Right    Next MD Visit  PRN       AROM   Right/Left Shoulder  Left sitting    Left Shoulder Flexion  129 Degrees    Left Shoulder ABduction  70 Degrees                  [facility] Adult PT Treatment/Exercise - 08/06/17 0001      Lumbar Exercises: Stretches   Piriformis Stretch  2 reps;30 seconds travell varying angles       Lumbar Exercises: Standing   Other Standing Lumbar Exercises  hip extension standing hands on cabinet x 10 reps 2 sets       Knee/Hip Exercises: Stretches   Hip Flexor Stretch  2 reps;30 seconds knee to chest in supine     Other Knee/Hip Stretches  ER stretch 30 sec x 3       Shoulder Exercises: Standing   Extension  Strengthening;Both;20 reps;Theraband red    Row  Strengthening;Both;20 reps;Theraband red    Retraction  Strengthening;Both;20 reps;Theraband red - with noodle     Other Standing Exercises  L's and W's x 10 each       Shoulder Exercises: Stretch   Other Shoulder Stretches  3 way doorway stretch 30 sec x 2       Modalities   Modalities  Electrical Stimulation;Moist Heat      Moist Heat Therapy   Number Minutes Moist Heat  20 Minutes    Moist Heat Location  Shoulder Lt       Electrical Stimulation   Electrical Stimulation Location  Lt shoulder    Electrical Stimulation Parameters  to tolerance    Electrical Stimulation Goals  Pain;Tone             PT Education - 08/06/17 1413    Education provided  Yes    Education Details  HEP     Person(s) Educated  Patient    Methods  Explanation;Demonstration;Tactile cues;Verbal cues;Handout    Comprehension  Verbalized understanding;Returned demonstration;Verbal cues required;Tactile cues required          PT Long Term Goals - 08/06/17 1414      PT LONG TERM GOAL #1   Title  Improve moblity and ROM thorugh lumbar sine and bilat hips demonstrated with correct stretch for hamstrings and hip musculature 08/18/17    Time  6    Period  Weeks     Status  Achieved      PT LONG TERM GOAL #2   Title  Patient reports confidence in independent HEP 08/28/17     Time  6    Period  Weeks    Status  Partially Met      PT LONG TERM GOAL #3   Title  Improve FOTO to 29% limitation 11/30//18    Time  6    Period  Weeks    Status  On-going      PT LONG TERM GOAL #4   Title  increase Lt shoulder ROM to allow patient to easily reach top shelf ( 08/28/17)     Time  6    Period  Weeks    Status  On-going      PT LONG TERM GOAL #5   Time  6    Period  Weeks    Status  Partially Met            Plan - 08/06/17 1446    Clinical Impression Statement  Improving pain level but no significant change in ROM Lt shoulder. Added exerrcises without difficulty. progressing toward stated goals of therapy.     Rehab Potential  Good    PT Frequency  1x / week    PT Duration  6 weeks    PT Treatment/Interventions  Patient/family education;ADLs/Self Care Home Management;Cryotherapy;Electrical Stimulation;Iontophoresis 4mg/ml Dexamethasone;Moist Heat;Ultrasound;Dry needling;Manual techniques;Therapeutic activities;Therapeutic exercise;Neuromuscular re-education    PT Next Visit Plan  follow up with us after 11/6 and he is back in town.  Continue with shoulder ex and core strengthening. Possible trial of DN for hip musculature     Consulted and Agree with Plan of Care  Patient       Patient will benefit from skilled therapeutic intervention in order to improve the following deficits and impairments:  Postural dysfunction, Improper body mechanics, Pain, Increased fascial restricitons, Increased muscle spasms, Decreased mobility, Decreased range of motion, Decreased activity tolerance, Impaired UE functional use, Decreased strength  Visit Diagnosis: Acute right-sided low back pain without sciatica  Other symptoms and signs involving   the musculoskeletal system  Muscle weakness (generalized)  Stiffness of left shoulder, not elsewhere  classified  Chronic left shoulder pain     Problem List Patient Active Problem List   Diagnosis Date Noted  . Back pain   . Compression fracture of L2 (HCC) 06/07/2017  . Insomnia 10/14/2016  . DJD (degenerative joint disease) 10/14/2016  . PCP NOTES >>>>>>>>>>>>>>>>>>>>>>>>> 11/28/2015  . Benign prostatic hyperplasia with urinary obstruction 05/30/2015  . Fungal dermatitis 02/23/2015  . Annual physical exam 11/27/2014  . Essential hypertension 10/02/2014  . Alcohol use disorder, mild, abuse 10/02/2014  . Bruit 08/09/2014  . Medication management 07/27/2014  . GERD (gastroesophageal reflux disease) 05/25/2014  . History of rectal cancer 05/15/2014  . Personal history of colonic polyps 05/15/2014  . Diverticulosis of colon (without mention of hemorrhage) 05/15/2014  . Family history of malignant neoplasm of gastrointestinal tract 05/15/2014  . Depression 04/19/2014     P  PT, MPH  08/06/2017, 5:13 PM  [facility] [address] [place], [place], [address] Phone: [phone]   Fax:  [phone]  Name: Devin [name] MRN: [id number] Date of Birth: 08/08/1941   

## 2017-08-06 NOTE — Consult Note (Signed)
Chief Complaint: Patient was seen in follow-up today for  Chief Complaint  Patient presents with  . Follow-up    Follow up Kyphoplasty   at the request of Devin [name]  Referring Physician(s): Devin [name]  History of Present Illness: Devin [name] is a 76 y.o. male who suffered a fall and an acute compression fracture of the superior endplate of L2 back in September. Initial conservative management failed and he was treated with percutaneous cement augmentation (kyphoplasty) as an inpatient at [facility].  He presents today for his initial follow-up evaluation. Devin [name] is doing exceptionally well. He reports that his pain was relieved almost immediately following the kyphoplasty. His mobility has taken a little longer to come back due to deconditioning from bed rest related to his initial conservative management. However, he has now back to full activity and extremely excited to resume fishing and gardening. He has no acute complaints today.   He is extremely grateful and happy with his results.  Past Medical History:  Diagnosis Date  . Cataract   . COPD (chronic obstructive pulmonary disease) ([place])   . Diverticulosis    pt states he had diverticuli in his bladder also.  . H/O [name] spotted fever 01/27/2014  . Hematuria    over 5 years ago  . Hernia of abdominal cavity   . History of rectal polyps   . Hyperplastic colon polyp   . Hypertension   . Macular degeneration   . Rectal cancer ([place]) 1994  . Shoulder pain, left    per pt, he can lay on the left side!  . Tubular adenoma    01/05/2013    Past Surgical History:  Procedure Laterality Date  . APPENDECTOMY    . COLON SURGERY     rectal cancer ~ 1999  . EYE SURGERY     cataract sx  . HERNIA REPAIR    . IR KYPHO LUMBAR INC FX REDUCE BONE BX UNI/BIL CANNULATION INC/IMAGING  06/12/2017  . mastoid gland removal     left ear  . PROSTATE SURGERY  05/2015  . TONSILLECTOMY       Allergies: Patient has no known allergies.  Medications: Prior to Admission medications   Medication Sig Start Date End Date Taking? Authorizing Provider  acetaminophen (TYLENOL) 500 MG tablet Take 1,000 mg by mouth every 6 (six) hours as needed for mild pain.   Yes [provider]  calcium carbonate (OS-CAL) 600 MG TABS tablet Take 1 tablet (600 mg total) by mouth daily with breakfast. + vitamin D 12/19/14  Yes Midge Minium, MD  nortriptyline (PAMELOR) 50 MG capsule Take 1 capsule (50 mg total) by mouth at bedtime. Patient taking differently: Take 50 mg by mouth at bedtime as needed (sleep).  03/23/17  Yes Shelda Pal, DO  omeprazole (PRILOSEC) 20 MG capsule Take 1 capsule (20 mg total) by mouth daily. 06/17/16  Yes Paz, Alda Berthold, MD  bisacodyl (DULCOLAX) 5 MG EC tablet Take 1 tablet (5 mg total) by mouth daily as needed for moderate constipation. Patient not taking: Reported on 07/08/2017 06/13/17   Jani Gravel, MD  cyclobenzaprine (FLEXERIL) 5 MG tablet Take 1 tablet (5 mg total) by mouth 3 (three) times daily as needed for muscle spasms. Patient not taking: Reported on 07/08/2017 06/13/17   Jani Gravel, MD  HYDROcodone-acetaminophen (NORCO/VICODIN) 5-325 MG tablet Take 1-2 tablets by mouth every 6 (six) hours as needed for moderate pain. Patient not taking: Reported on  07/08/2017 06/13/17   Jani Gravel, MD  Multiple Vitamins-Minerals (50+ ADULT EYE HEALTH PO) Take 1 capsule by mouth daily.     [provider]  polyethylene glycol (MIRALAX / GLYCOLAX) packet Take 17 g by mouth 2 (two) times daily. Patient not taking: Reported on 07/08/2017 06/13/17   Jani Gravel, MD     Family History  Problem Relation Age of Onset  . Breast cancer Mother   . Colon cancer Mother 44       died 47  . Aneurysm Father   . Heart attack Father        82  . Aneurysm Son   . Stroke Paternal Grandfather   . Cancer Paternal Grandmother   . Cancer Maternal Grandmother   .  Prostate cancer Neg Hx   . Esophageal cancer Neg Hx   . Pancreatic cancer Neg Hx   . Rectal cancer Neg Hx   . Stomach cancer Neg Hx     Social History   Socioeconomic History  . Marital status: Married    Spouse name: Denice Paradise  . Number of children: 2  . Years of education: Not on file  . Highest education level: Not on file  Social Needs  . Financial resource strain: Not on file  . Food insecurity - worry: Not on file  . Food insecurity - inability: Not on file  . Transportation needs - medical: Not on file  . Transportation needs - non-medical: Not on file  Occupational History  . Occupation: RetiredSocial research officer, government  Tobacco Use  . Smoking status: Former Smoker    Last attempt to quit: 04/19/1989    Years since quitting: 28.3  . Smokeless tobacco: Never Used  Substance and Sexual Activity  . Alcohol use: Yes    Alcohol/week: 8.4 oz    Types: 14 Glasses of wine per week    Comment: most days  , amounts varies , sometimes > 2 servings   . Drug use: No  . Sexual activity: Not on file  Other Topics Concern  . Not on file  Social History Narrative   Household-- pt, wife (she has a post polio syndrome)   Lost oldest son ~ 2011   Moved from [place] ~ 2011   Youngest son lives in [place] reason why he moved here      Review of Systems: A 12 point ROS discussed and pertinent positives are indicated in the HPI above.  All other systems are negative.  Review of Systems  Vital Signs: BP (!) 159/80   Pulse 76   Temp 97.7 F (36.5 C) (Oral)   Resp 14   Ht 6\' 2"  (1.88 m)   Wt 208 lb (94.3 kg)   SpO2 96%   BMI 26.71 kg/m   Physical Exam  Constitutional: He is oriented to person, place, and time. He appears well-developed and well-nourished. No distress.  HENT:  Head: Normocephalic and atraumatic.  Eyes: No scleral icterus.  Cardiovascular: Normal rate and regular rhythm.  Pulmonary/Chest: Effort normal.  Abdominal: Soft. He exhibits no distension. There is no tenderness.   Neurological: He is alert and oriented to person, place, and time.  Skin: Skin is warm and dry.  Psychiatric: He has a normal mood and affect. His behavior is normal.  Nursing note and vitals reviewed.    Imaging: No results found.  Labs:  CBC: Recent Labs    10/14/16 1046 06/07/17 1554 06/10/17 0539 06/11/17 0556  WBC 6.9 8.4 6.9 7.9  HGB  14.4 13.8 13.6 13.9  HCT 43.0 40.6 39.2 40.1  PLT 175.0 136* 159 173    COAGS: Recent Labs    06/11/17 0556  INR 0.96    BMP: Recent Labs    10/14/16 1046 06/07/17 1554 06/10/17 0539 06/11/17 0556  NA 138 137 137 136  K 4.3 3.8 3.8 3.9  CL 103 103 101 99*  CO2 30 25 27 27   GLUCOSE 104* 102* 113* 119*  BUN 15 15 17 15   CALCIUM 9.4 8.9 9.0 9.1  CREATININE 0.82 0.72 0.78 0.77  GFRNONAA  --  >60 >60 >60  GFRAA  --  >60 >60 >60    LIVER FUNCTION TESTS: No results for input(s): BILITOT, AST, ALT, ALKPHOS, PROT, ALBUMIN in the last 8760 hours.  TUMOR MARKERS: No results for input(s): AFPTM, CEA, CA199, CHROMGRNA in the last 8760 hours.  Assessment and Plan:  Mr. Cullars is doing exceptionally well following his L2 cement augmentation. His symptoms have completely resolved and he is very happy with his outcome.  No further follow-up necessary. Mr. Kruer will call us if he ever develops recurrent symptoms.  Electronically Signed: Jacqulynn Cadet 08/06/2017, 10:53 AM   I spent a total of  15 Minutes in face to face in clinical consultation, greater than 50% of which was counseling/coordinating care for L2 compression fracture

## 2017-08-11 ENCOUNTER — Other Ambulatory Visit: Payer: Self-pay

## 2017-08-11 ENCOUNTER — Ambulatory Visit (INDEPENDENT_AMBULATORY_CARE_PROVIDER_SITE_OTHER): Payer: Medicare HMO | Admitting: Rehabilitative and Restorative Service Providers"

## 2017-08-11 ENCOUNTER — Encounter: Payer: Self-pay | Admitting: Rehabilitative and Restorative Service Providers"

## 2017-08-11 DIAGNOSIS — M6281 Muscle weakness (generalized): Secondary | ICD-10-CM

## 2017-08-11 DIAGNOSIS — G8929 Other chronic pain: Secondary | ICD-10-CM | POA: Diagnosis not present

## 2017-08-11 DIAGNOSIS — M25512 Pain in left shoulder: Secondary | ICD-10-CM | POA: Diagnosis not present

## 2017-08-11 DIAGNOSIS — R29898 Other symptoms and signs involving the musculoskeletal system: Secondary | ICD-10-CM | POA: Diagnosis not present

## 2017-08-11 DIAGNOSIS — M545 Low back pain, unspecified: Secondary | ICD-10-CM

## 2017-08-11 DIAGNOSIS — M25612 Stiffness of left shoulder, not elsewhere classified: Secondary | ICD-10-CM | POA: Diagnosis not present

## 2017-08-11 NOTE — Patient Instructions (Addendum)
HIP: Hamstrings - Supine  Place strap around foot. Raise leg up, keeping knee straight.  Bend opposite knee to protect back if indicated. Hold 30 seconds. 3 reps per set, 2-3 sets per day  Outer Hip Stretch: Reclined IT Band Stretch (Strap)   Strap around one foot, pull leg across body until you feel a pull or stretch in the outside of your hip, with shoulders on mat. Hold for 30 seconds. Repeat 3 times each leg. 2-3 times/day.  Piriformis Stretch   Lying on back, pull right knee toward opposite shoulder. Hold 30 seconds. Repeat 3 times. Do 2-3 sessions per day.      Being in the supine position means to be lying on the back. Lying on the back is the position of least compression on the bones and discs of the spine, and helps to re-align the natural curves of the back. Arms at ~ 80-90 degrees from side. Start with 2-3 minutes and increase to 5 min. Can bend elbows to release stretch if needed.     Functional Quadriceps: Sit to Stand    Sit on edge of chair, feet flat on floor. Stand upright, extending knees fully. Repeat __10__ times per set. Do _1-2__ sets per session. Do __1__ sessions per day.    Quads / HF, Standing    Stand, holding onto chair and grasping one foot with same-side hand. Pull heel toward buttock. Hold _30__ seconds.  Repeat _3__ times per session. Do _1__ sessions per day.   Gastroc Stretch    Stand with right foot back, leg straight, forward leg bent. Keeping heel on floor, turned slightly out, lean into wall until stretch is felt in calf. Hold _30___ seconds. Repeat __3__ times per set.  Do __1__ sessions per day.   Scapula Adduction With Pectoralis Stretch: Low - Standing   Shoulders at 45 hands even with shoulders, keeping weight through legs, shift weight forward until you feel pull or stretch through the front of your chest. Hold _30__ seconds. Do _3__ times, _2-4__ times per day.   Scapula Adduction With Pectoralis Stretch:  Mid-Range - Standing   Shoulders at 90 elbows even with shoulders, keeping weight through legs, shift weight forward until you feel pull or strength through the front of your chest. Hold __30_ seconds. Do _3__ times, __2-4_ times per day.   Scapula Adduction With Pectoralis Stretch: High - Standing   Shoulders at 120 hands up high on the doorway, keeping weight on feet, shift weight forward until you feel pull or stretch through the front of your chest. Hold _30__ seconds. Do _3__ times, _2-3__ times per day.   Shoulder Blade Squeeze    Rotate shoulders back, then squeeze shoulder blades down and back. Hold 10 sec Repeat __10__ times. Do __several__ sessions per day. Can use swim noodle along spine for tactile cue.   Upper Back Strength: Lower Trapezius / Rotator Cuff " L's "     Arms in waitress pose, palms up. Press hands back and slide shoulder blades down. Hold for __5__ seconds. Repeat _10___ times. 1-2 times per day.    Scapular Retraction: Elbow Flexion (Standing)  "W's"     With elbows bent to 90, pinch shoulder blades together and rotate arms out, keeping elbows bent. Repeat __10__ times per set. Do __1-2__ sets per session. Do _several ___ sessions per day.  Resisted External Rotation: in Neutral - Bilateral   PALMS UP Sit or stand, tubing in both hands, elbows at sides, bent to  90, forearms forward. Pinch shoulder blades together and rotate forearms out. Keep elbows at sides. Repeat __10__ times per set. Do _2-3___ sets per session. Do _1__ sessions per day.   Low Row: Standing   Face anchor, feet shoulder width apart. Palms up, pull arms back, squeezing shoulder blades together. Repeat 10__ times per set. Do 2-3__ sets per session. Do 1__ sessions per day. Anchor Height: Waist    Strengthening: Resisted Extension   Hold tubing in right hand, arm forward. Pull arm back, elbow straight. Repeat _10___ times per set. Do 2-3____ sets per session.  Do 1____ sessions per day.    [facility] [place] [address] [place], [place] [address]  [phone] (office) [phone] (fax)

## 2017-08-11 NOTE — Therapy (Signed)
[place], [place], [address] Phone: [phone]   Fax:  [phone]  Physical Therapy Treatment  Patient Details  Name: Devin [name] MRN: [id number] Date of Birth: [birth year] Referring Provider: Dr Riki Sheer    Encounter Date: 08/11/2017  PT End of Session - 08/11/17 0853    Visit Number  6    Number of Visits  10    Date for PT Re-Evaluation  08/28/17    PT Start Time  6333    PT Stop Time  0955    PT Time Calculation (min)  64 min    Activity Tolerance  Patient tolerated treatment well       Past Medical History:  Diagnosis Date  . Cataract   . COPD (chronic obstructive pulmonary disease) ([place])   . Diverticulosis    pt states he had diverticuli in his bladder also.  . H/O [facility] spotted fever 01/27/2014  . Hematuria    over 5 years ago  . Hernia of abdominal cavity   . History of rectal polyps   . Hyperplastic colon polyp   . Hypertension   . Macular degeneration   . Rectal cancer ([place]) 1994  . Shoulder pain, left    per pt, he can lay on the left side!  . Tubular adenoma    01/05/2013    Past Surgical History:  Procedure Laterality Date  . APPENDECTOMY    . COLON SURGERY     rectal cancer ~ 1999  . EYE SURGERY     cataract sx  . HERNIA REPAIR    . IR KYPHO LUMBAR INC FX REDUCE BONE BX UNI/BIL CANNULATION INC/IMAGING  06/12/2017  . mastoid gland removal     left ear  . PROSTATE SURGERY  05/2015  . TONSILLECTOMY      There were no vitals filed for this visit.  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 He is feeling better with hip/back/shoulder. Wants a routine for the gym that he doesn't have to think about.     Currently in Pain?  Yes    Pain Score  1     Pain Location  Hip    Pain Orientation  Right;Left    Pain Descriptors / Indicators  Aching;Dull    Multiple Pain Sites  Yes    Pain  Score  1    Pain Location  Shoulder    Pain Orientation  Left    Pain Type  Chronic pain    Pain Onset  More than a month ago    Pain Frequency  Constant    Aggravating Factors   lifting arm for any use     Pain Relieving Factors  nothing                       [facility] Adult PT Treatment/Exercise - 08/11/17 0001      Lumbar Exercises: Stretches   Passive Hamstring Stretch  30 seconds;2 reps supine with strap    Standing Extension  3 reps    ITB Stretch  2 reps;30 seconds supine with strap     Piriformis Stretch  2 reps;30 seconds travell varying angles       Lumbar Exercises: Aerobic   Stationary Bike  Nustep L5 x 6 min       Lumbar Exercises: Supine   Bridge  10  reps figure 4       Shoulder Exercises: Supine   Other Supine Exercises  10 reps each with red band, overhead pull, ER and horizontal abductions.       Shoulder Exercises: Standing   Extension  Strengthening;Both;20 reps;Theraband red    Theraband Level (Shoulder Extension)  Level 2 (Red)    Row  Strengthening;Both;20 reps;Theraband red    Theraband Level (Shoulder Row)  Level 2 (Red)    Retraction  Strengthening;Both;20 reps;Theraband red - with noodle     Theraband Level (Shoulder Retraction)  Level 2 (Red)    Other Standing Exercises  L's and W's x 10 each     Other Standing Exercises  scap squeeze 10 sec x 10       Shoulder Exercises: Stretch   Other Shoulder Stretches  3 way doorway stretch 30 sec x 2     Other Shoulder Stretches  supine snow angel ~ 1-2 min arms at ~ 75 deg abduction       Moist Heat Therapy   Number Minutes Moist Heat  20 Minutes    Moist Heat Location  Shoulder;Lumbar Spine;Hip Lt       Acupuncturist Location  Lt shoulder     Electrical Stimulation Action  IFC    Electrical Stimulation Parameters  to tolerance    Electrical Stimulation Goals  Pain;Tone      Manual Therapy   Manual therapy comments  pt supine    Joint Mobilization  shd  circumduction     Soft tissue mobilization  anterior shoulder and pecs    Myofascial Release  anterior shoulder    Scapular Mobilization  Lt    Passive ROM  Lt shoulder ER in scaption              PT Education - 08/11/17 0857    Education provided  Yes    Education Details  HEP     Person(s) Educated  Patient    Methods  Explanation;Demonstration;Tactile cues;Verbal cues;Handout    Comprehension  Verbalized understanding;Returned demonstration;Verbal cues required          PT Long Term Goals - 08/11/17 0854      PT LONG TERM GOAL #1   Title  Improve moblity and ROM thorugh lumbar sine and bilat hips demonstrated with correct stretch for hamstrings and hip musculature 08/18/17    Time  6    Period  Weeks    Status  Achieved      PT LONG TERM GOAL #2   Title  Patient reports confidence in independent HEP 08/28/17     Time  6    Period  Weeks    Status  Partially Met      PT LONG TERM GOAL #3   Title  Improve FOTO to 29% limitation 11/30//18    Time  6    Period  Weeks    Status  On-going      PT LONG TERM GOAL #4   Title  increase Lt shoulder ROM to allow patient to easily reach top shelf ( 08/28/17)     Time  6    Period  Weeks    Status  On-going      PT LONG TERM GOAL #5   Title  report =/> 75% reduction in Lt shoulder pain with daily activity ( 08/28/17)     Time  6    Period  Weeks    Status  Partially Met  Plan - 08/11/17 1046    Clinical Impression Statement  Cotninued improvement with patient reporting decreased pain and improved function Lt shoulder - 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 Reviewed HEP and will work to establish a routine of exercise.     Rehab Potential  Good    PT Frequency  1x / week    PT Treatment/Interventions  Patient/family education;ADLs/Self Care Home Management;Cryotherapy;Electrical Stimulation;Iontophoresis 80m/ml Dexamethasone;Moist Heat;Ultrasound;Dry needling;Manual techniques;Therapeutic  activities;Therapeutic exercise;Neuromuscular re-education    PT Next Visit Plan  Continue with shoulder ex and core strengthening. Possible trial of DN for hip musculature     Consulted and Agree with Plan of Care  Patient       Patient will benefit from skilled therapeutic intervention in order to improve the following deficits and impairments:  Postural dysfunction, Improper body mechanics, Pain, Increased fascial restricitons, Increased muscle spasms, Decreased mobility, Decreased range of motion, Decreased activity tolerance, Impaired UE functional use, Decreased strength  Visit Diagnosis: Acute right-sided low back pain without sciatica  Other symptoms and signs involving the musculoskeletal system  Muscle weakness (generalized)  Stiffness of left shoulder, not elsewhere classified  Chronic left shoulder pain     Problem List Patient Active Problem List   Diagnosis Date Noted  . Back pain   . [place] 06/07[place]  . Insomnia 10/14/2016  . DJD (degenerative joint disease) 10/14/2016  . PCP NOTES >>>>>>>>>>>>>>>>>>>>>>>>> 11/28/2015  . Benign prostatic hyperplasia with urinary obstruction 05/30/2015  . Fungal dermatitis 02/23/2015  . Annual physical exam 11/27/2014  . Essential hypertension 10/02/2014  . Alcohol use disorder, mild, abuse 10/02/2014  . Bruit 08/09/2014  . Medication management 07/27/2014  . GERD (gastroesophageal reflux disease) 05/25/2014  . History of rectal cancer 05/15/2014  . Personal history of colonic polyps 05/15/2014  . Diverticulosis of colon (without mention of hemorrhage) 05/15/2014  . Family history of malignant neoplasm of gastrointestinal tract 05/15/2014  . Depression 04/19/2014     PNilda SimmerPT, MPH  08/11/2017, 10:48 AM  [facility] [address]Phone: [phone]  Fax:  [phone] Name: Devin PRIDGENMRN:  [id number] of Birth: 5July 29, 1942

## 2017-08-13 ENCOUNTER — Encounter: Payer: Self-pay | Admitting: Rehabilitative and Restorative Service Providers"

## 2017-08-13 ENCOUNTER — Ambulatory Visit (INDEPENDENT_AMBULATORY_CARE_PROVIDER_SITE_OTHER): Payer: Medicare HMO | Admitting: Rehabilitative and Restorative Service Providers"

## 2017-08-13 ENCOUNTER — Other Ambulatory Visit: Payer: Self-pay

## 2017-08-13 DIAGNOSIS — M545 Low back pain, unspecified: Secondary | ICD-10-CM

## 2017-08-13 DIAGNOSIS — G8929 Other chronic pain: Secondary | ICD-10-CM

## 2017-08-13 DIAGNOSIS — M6281 Muscle weakness (generalized): Secondary | ICD-10-CM | POA: Diagnosis not present

## 2017-08-13 DIAGNOSIS — M25512 Pain in left shoulder: Secondary | ICD-10-CM | POA: Diagnosis not present

## 2017-08-13 DIAGNOSIS — M25612 Stiffness of left shoulder, not elsewhere classified: Secondary | ICD-10-CM

## 2017-08-13 DIAGNOSIS — R29898 Other symptoms and signs involving the musculoskeletal system: Secondary | ICD-10-CM | POA: Diagnosis not present

## 2017-08-13 NOTE — Therapy (Signed)
[place], [place], [address] Phone: [phone]   Fax:  [phone]  Physical Therapy Treatment  Patient Details  Name: Devin [name] MRN: [id number] Date of Birth: 06-10-1941 Referring Provider: Dr Riki Sheer    Encounter Date: 08/13/2017  PT End of Session - 08/13/17 0857    Visit Number  7    Number of Visits  10    Date for PT Re-Evaluation  08/28/17    PT Start Time  0852    PT Stop Time  0945    PT Time Calculation (min)  53 min    Activity Tolerance  Patient tolerated treatment well       Past Medical History:  Diagnosis Date  . Cataract   . COPD (chronic obstructive pulmonary disease) ([place])   . Diverticulosis    pt states he had diverticuli in his bladder also.  . H/O [facility] spotted fever 01/27/2014  . Hematuria    over 5 years ago  . Hernia of abdominal cavity   . History of rectal polyps   . Hyperplastic colon polyp   . Hypertension   . Macular degeneration   . Rectal cancer ([place]) 1994  . Shoulder pain, left    per pt, he can lay on the left side!  . Tubular adenoma    01/05/2013    Past Surgical History:  Procedure Laterality Date  . APPENDECTOMY    . COLON SURGERY     rectal cancer ~ 1999  . EYE SURGERY     cataract sx  . HERNIA REPAIR    . IR KYPHO LUMBAR INC FX REDUCE BONE BX UNI/BIL CANNULATION INC/IMAGING  06/12/2017  . mastoid gland removal     left ear  . PROSTATE SURGERY  05/2015  . TONSILLECTOMY      There were no vitals filed for this visit.  Subjective Assessment - 08/13/17 0858    Subjective  Awoke 5 times last night with pain in the Lt hip. Hip is just aching today.     Currently in Pain?  Yes    Pain Score  1     Pain Location  Hip    Pain Orientation  Left    Pain Descriptors / Indicators  Aching;Dull    Pain Onset  More than a month ago    Pain Frequency  Intermittent    Pain Score  0    Pain Location  Shoulder    Pain  Orientation  Left                      [facility] Adult PT Treatment/Exercise - 08/13/17 0001      Lumbar Exercises: Stretches   Passive Hamstring Stretch  30 seconds;2 reps supine with strap    ITB Stretch  2 reps;30 seconds supine with strap     Piriformis Stretch  2 reps;30 seconds travell varying angles       Lumbar Exercises: Aerobic   Stationary Bike  Nustep L5 x 6 min       Moist Heat Therapy   Number Minutes Moist Heat  20 Minutes    Moist Heat Location  Shoulder;Lumbar Spine;Hip Lt       Acupuncturist Location  Lt posterior hip    Electrical Stimulation Action  IFC    Electrical Stimulation Parameters  to tolerance    Electrical Stimulation Goals  Pain;Tone  Manual Therapy   Manual therapy comments  pt prone     Joint Mobilization  PA mobs Lt hip     Soft tissue mobilization  Lt posterior hip     Myofascial Release  Lt posterior shoulder                   PT Long Term Goals - 08/11/17 0854      PT LONG TERM GOAL #1   Title  Improve moblity and ROM thorugh lumbar sine and bilat hips demonstrated with correct stretch for hamstrings and hip musculature 08/18/17    Time  6    Period  Weeks    Status  Achieved      PT LONG TERM GOAL #2   Title  Patient reports confidence in independent HEP 08/28/17     Time  6    Period  Weeks    Status  Partially Met      PT LONG TERM GOAL #3   Title  Improve FOTO to 29% limitation 11/30//18    Time  6    Period  Weeks    Status  On-going      PT LONG TERM GOAL #4   Title  increase Lt shoulder ROM to allow patient to easily reach top shelf ( 08/28/17)     Time  6    Period  Weeks    Status  On-going      PT LONG TERM GOAL #5   Title  report =/> 75% reduction in Lt shoulder pain with daily activity ( 08/28/17)     Time  6    Period  Weeks    Status  Partially Met            Plan - 08/13/17 0904    Clinical Impression Statement  Continued improvement  in pain of Lt shoulder and hip. Tolerated DN well with good release of soft tissue with DN and manual work.     Rehab Potential  Good    PT Frequency  2x / week    PT Duration  6 weeks    PT Treatment/Interventions  Patient/family education;ADLs/Self Care Home Management;Cryotherapy;Electrical Stimulation;Iontophoresis 20m/ml Dexamethasone;Moist Heat;Ultrasound;Dry needling;Manual techniques;Therapeutic activities;Therapeutic exercise;Neuromuscular re-education    PT Next Visit Plan  Continue with shoulder ex and core strengthening. Assess respopnse to DN for hip musculature     PT Home Exercise Plan  continue work for shoulder and hip     Consulted and Agree with Plan of Care  Patient       Patient will benefit from skilled therapeutic intervention in order to improve the following deficits and impairments:  Postural dysfunction, Improper body mechanics, Pain, Increased fascial restricitons, Increased muscle spasms, Decreased mobility, Decreased range of motion, Decreased activity tolerance, Impaired UE functional use, Decreased strength  Visit Diagnosis: Acute right-sided low back pain without sciatica  Other symptoms and signs involving the musculoskeletal system  Muscle weakness (generalized)  Stiffness of left shoulder, not elsewhere classified  Chronic left shoulder pain     Problem List Patient Active Problem List   Diagnosis Date Noted  . Back pain   . Compression fracture of L2 ([place] 06/07/2017  . Insomnia 10/14/2016  . DJD (degenerative joint disease) 10/14/2016  . PCP NOTES >>>>>>>>>>>>>>>>>>>>>>>>> 11/28/2015  . Benign prostatic hyperplasia with urinary obstruction 05/30/2015  . Fungal dermatitis 02/23/2015  . Annual physical exam 11/27/2014  . Essential hypertension 10/02/2014  . Alcohol use disorder, mild, abuse 10/02/2014  . Bruit  08/09/2014  . Medication management 07/27/2014  . GERD (gastroesophageal reflux disease) 05/25/2014  . History of rectal cancer  05/15/2014  . Personal history of colonic polyps 05/15/2014  . Diverticulosis of colon (without mention of hemorrhage) 05/15/2014  . Family history of malignant neoplasm of gastrointestinal tract 05/15/2014  . Depression 04/19/2014    Dreshaun Stene Nilda Simmer PT, MPH  08/13/2017, 9:19 AM  [facility] [name], [place], [address] Phone: [phone]   Fax:  [phone]  Name: Devin [name] MRN: [id number] Date of Birth: 09/29/1941

## 2017-08-17 ENCOUNTER — Encounter: Payer: Self-pay | Admitting: Physical Therapy

## 2017-08-17 ENCOUNTER — Ambulatory Visit (INDEPENDENT_AMBULATORY_CARE_PROVIDER_SITE_OTHER): Payer: Medicare HMO | Admitting: Physical Therapy

## 2017-08-17 DIAGNOSIS — M545 Low back pain, unspecified: Secondary | ICD-10-CM

## 2017-08-17 DIAGNOSIS — M25612 Stiffness of left shoulder, not elsewhere classified: Secondary | ICD-10-CM

## 2017-08-17 DIAGNOSIS — M6281 Muscle weakness (generalized): Secondary | ICD-10-CM | POA: Diagnosis not present

## 2017-08-17 DIAGNOSIS — R29898 Other symptoms and signs involving the musculoskeletal system: Secondary | ICD-10-CM | POA: Diagnosis not present

## 2017-08-17 NOTE — Therapy (Signed)
[place], [place], [address] Phone: [phone]   Fax:  [phone]  Physical Therapy Treatment  Patient Details  Name: Devin [name] MRN: [id number] Date of Birth: Sep 02, 1941 Referring Provider: Dr. Riki Sheer    Encounter Date: 08/17/2017  PT End of Session - 08/17/17 0935    Visit Number  8    Number of Visits  10    Date for PT Re-Evaluation  08/28/17    PT Start Time  0930    PT Stop Time  1033    PT Time Calculation (min)  63 min    Activity Tolerance  Patient tolerated treatment well    Behavior During Therapy  [facility] for tasks assessed/performed       Past Medical History:  Diagnosis Date  . Cataract   . COPD (chronic obstructive pulmonary disease) ([place])   . Diverticulosis    pt states he had diverticuli in his bladder also.  . H/O [facility] spotted fever 01/27/2014  . Hematuria    over 5 years ago  . Hernia of abdominal cavity   . History of rectal polyps   . Hyperplastic colon polyp   . Hypertension   . Macular degeneration   . Rectal cancer ([place]) 1994  . Shoulder pain, left    per pt, he can lay on the left side!  . Tubular adenoma    01/05/2013    Past Surgical History:  Procedure Laterality Date  . APPENDECTOMY    . COLON SURGERY     rectal cancer ~ 1999  . EYE SURGERY     cataract sx  . HERNIA REPAIR    . IR KYPHO LUMBAR INC FX REDUCE BONE BX UNI/BIL CANNULATION INC/IMAGING  06/12/2017  . mastoid gland removal     left ear  . PROSTATE SURGERY  05/2015  . TONSILLECTOMY      There were no vitals filed for this visit.  Subjective Assessment - 08/17/17 0936    Subjective  Pt reports the DN to hip helped, but some of the tightness has returned.  He has pain near his LB surgery, but it's a new location.  Sleep has improved; position changes help eliminate pain.     Pertinent History  double hernia post surgery ~25 yrs ago; rectal cancer 17 yrs ago; prostate  sx 2017    Currently in Pain?  Yes    Pain Score  1  up to 6/96 with certain motions.     Pain Location  Hip    Pain Orientation  Left    Aggravating Factors   reaching over to put on socks    Pain Relieving Factors  DN    Pain Score  2    Pain Location  Shoulder    Pain Orientation  Left    Aggravating Factors   lifting arm for any use    Pain Relieving Factors  heat         [facility] PT Assessment - 08/17/17 0001      Assessment   Medical Diagnosis  LBP: bilat greater trochanteric bursitis, Lt shoulder pain     Referring Provider  Dr. Riki Sheer     Onset Date/Surgical Date  05/31/17    Hand Dominance  Right    Next MD Visit  PRN       AROM   Right/Left Shoulder  Left    Left Shoulder Flexion  133 Degrees measured in sitting  [facility] Adult PT Treatment/Exercise - 08/17/17 0001      Lumbar Exercises: Stretches   Passive Hamstring Stretch  30 seconds;2 reps supine with strap    Quad Stretch  2 reps;30 seconds    ITB Stretch  2 reps;30 seconds supine with strap     Piriformis Stretch  30 seconds;3 reps travell varying angles       Lumbar Exercises: Aerobic   Stationary Bike  Nustep L5 x 6 min (arms/legs for 3 min, legs only 3 min)      Lumbar Exercises: Standing   Functional Squats  10 reps;2 seconds  Doorway stretch x 30 sec x 3 reps in low and middle position.     Lumbar Exercises: Seated   Sit to Stand  -- 2 reps, slow eccentric control      Moist Heat Therapy   Number Minutes Moist Heat  20 Minutes    Moist Heat Location  Shoulder;Lumbar Spine;Hip Lt       Acupuncturist Location  Lt posterior hip    Electrical Stimulation Action  IFC    Electrical Stimulation Parameters   to tolerance     Electrical Stimulation Goals  Pain      Manual Therapy   Joint Mobilization  PA mobs Lt hip     Soft tissue mobilization  Lt posterior hip - Deep tissue work to [employer] hip rotators, glute med, with passive ER/IR of LLE.            PT Long Term Goals - 08/17/17 0949      PT LONG TERM GOAL #1   Title  Improve moblity and ROM thorugh lumbar spine and bilat hips demonstrated with correct stretch for hamstrings and hip musculature 08/18/17    Time  6    Period  Weeks    Status  Achieved      PT LONG TERM GOAL #2   Title  Patient reports confidence in independent HEP 08/28/17     Time  6    Period  Weeks    Status  Partially Met      PT LONG TERM GOAL #3   Title  Improve FOTO to 29% limitation 11/30//18    Time  6    Period  Weeks    Status  On-going      PT LONG TERM GOAL #4   Title  increase Lt shoulder ROM to allow patient to easily reach top shelf ( 08/28/17)     Time  6    Period  Weeks    Status  On-going      PT LONG TERM GOAL #5   Title  report =/> 75% reduction in Lt shoulder pain with daily activity ( 08/28/17)     Time  6    Period  Weeks    Status  Partially Met 50% reduction            Plan - 08/17/17 0957    Clinical Impression Statement  Improved pain reduction of Lt shoulder with functional movements and sleep; improved Lt shoulder flexion AROM. Pt reporting improved compliance and confidence with HEP.  Positive response from DN last few sessions.  Progressing well towards established therapy goals.     Rehab Potential  Good    PT Frequency  2x / week    PT Duration  6 weeks    PT Treatment/Interventions  Patient/family education;ADLs/Self Care Home Management;Cryotherapy;Electrical Stimulation;Iontophoresis 40m/ml Dexamethasone;Moist Heat;Ultrasound;Dry needling;Manual techniques;Therapeutic activities;Therapeutic exercise;Neuromuscular  re-education    PT Next Visit Plan  Continue DN and Lt shoulder ex and core strengthening.  Measure Lumbar and shoulder ROM (LGT #1 and 4).    PT Home Exercise Plan  continue work for shoulder and hip        Patient will benefit from skilled therapeutic intervention in order to improve the following deficits and impairments:  Postural  dysfunction, Improper body mechanics, Pain, Increased fascial restricitons, Increased muscle spasms, Decreased mobility, Decreased range of motion, Decreased activity tolerance, Impaired UE functional use, Decreased strength  Visit Diagnosis: Acute right-sided low back pain without sciatica  Other symptoms and signs involving the musculoskeletal system  Muscle weakness (generalized)  Stiffness of left shoulder, not elsewhere classified     Problem List Patient Active Problem List   Diagnosis Date Noted  . Back pain   . Compression fracture of L2 ([place]) 06/07/2017  . Insomnia 10/14/2016  . DJD (degenerative joint disease) 10/14/2016  . PCP NOTES >>>>>>>>>>>>>>>>>>>>>>>>> 11/28/2015  . Benign prostatic hyperplasia with urinary obstruction 05/30/2015  . Fungal dermatitis 02/23/2015  . Annual physical exam 11/27/2014  . Essential hypertension 10/02/2014  . Alcohol use disorder, mild, abuse 10/02/2014  . Bruit 08/09/2014  . Medication management 07/27/2014  . GERD (gastroesophageal reflux disease) 05/25/2014  . History of rectal cancer 05/15/2014  . Personal history of colonic polyps 05/15/2014  . Diverticulosis of colon (without mention of hemorrhage) 05/15/2014  . Family history of malignant neoplasm of gastrointestinal tract 05/15/2014  . Depression 04/19/2014   Kerin Perna, PTA 08/17/17 10:16 AM  [place], [place], [address] Phone: [phone]   Fax:  [phone]  Name: KIET GEER MRN: [id number] Date of Birth: May 24, 1941

## 2017-08-19 ENCOUNTER — Encounter: Payer: Self-pay | Admitting: Physician Assistant

## 2017-08-22 ENCOUNTER — Other Ambulatory Visit: Payer: Self-pay | Admitting: Internal Medicine

## 2017-08-29 ENCOUNTER — Other Ambulatory Visit: Payer: Self-pay | Admitting: Internal Medicine

## 2017-08-31 ENCOUNTER — Ambulatory Visit (INDEPENDENT_AMBULATORY_CARE_PROVIDER_SITE_OTHER): Payer: Medicare HMO | Admitting: Rehabilitative and Restorative Service Providers"

## 2017-08-31 DIAGNOSIS — M25612 Stiffness of left shoulder, not elsewhere classified: Secondary | ICD-10-CM | POA: Diagnosis not present

## 2017-08-31 DIAGNOSIS — R29898 Other symptoms and signs involving the musculoskeletal system: Secondary | ICD-10-CM

## 2017-08-31 DIAGNOSIS — M545 Low back pain, unspecified: Secondary | ICD-10-CM

## 2017-08-31 DIAGNOSIS — M25512 Pain in left shoulder: Secondary | ICD-10-CM | POA: Diagnosis not present

## 2017-08-31 DIAGNOSIS — G8929 Other chronic pain: Secondary | ICD-10-CM | POA: Diagnosis not present

## 2017-08-31 DIAGNOSIS — M6281 Muscle weakness (generalized): Secondary | ICD-10-CM | POA: Diagnosis not present

## 2017-08-31 NOTE — Therapy (Addendum)
[place], [place], [address] Phone: [phone]   Fax:  [phone]  Physical Therapy Treatment  Patient Details  Name: Devin [name] MRN: [id number] Date of Birth: [birth year] Referring Provider: Dr. Riki Sheer    Encounter Date: 08/31/2017  PT End of Session - 08/31/17 1419    Visit Number  9    Number of Visits  22    Date for PT Re-Evaluation  10/12/17    PT Start Time  3825    PT Stop Time  1524    PT Time Calculation (min)  64 min    Activity Tolerance  Patient tolerated treatment well       Past Medical History:  Diagnosis Date  . Cataract   . COPD (chronic obstructive pulmonary disease) ([place])   . Diverticulosis    pt states he had diverticuli in his bladder also.  . H/O [facility] spotted fever 01/27/2014  . Hematuria    over 5 years ago  . Hernia of abdominal cavity   . History of rectal polyps   . Hyperplastic colon polyp   . Hypertension   . Macular degeneration   . Rectal cancer ([place]) 1994  . Shoulder pain, left    per pt, he can lay on the left side!  . Tubular adenoma    01/05/2013    Past Surgical History:  Procedure Laterality Date  . APPENDECTOMY    . COLON SURGERY     rectal cancer ~ 1999  . EYE SURGERY     cataract sx  . HERNIA REPAIR    . IR KYPHO LUMBAR INC FX REDUCE BONE BX UNI/BIL CANNULATION INC/IMAGING  06/12/2017  . IR RADIOLOGIST EVAL & MGMT  08/06/2017  . mastoid gland removal     left ear  . PROSTATE SURGERY  05/2015  . TONSILLECTOMY      There were no vitals filed for this visit.  Subjective Assessment - 08/31/17 1425    Subjective  Pt reports he hadn't exercised in days and then he may have overdone it.  Since then his Lt shoulder has been "aweful".  "Every night my shoulder wakes me up with a jabbing pain, "    Patient Stated Goals  learn exercises for the gym - does not think he needs to be in therapy     Currently in Pain?  Yes    Pain Score  2     Pain Location  Shoulder    Pain Orientation  Left    Pain Descriptors / Indicators  Aching    Aggravating Factors   reaching     Pain Relieving Factors  heat          [facility] PT Assessment - 08/31/17 0001      Assessment   Medical Diagnosis  LBP: bilat greater trochanteric bursitis, Lt shoulder pain     Onset Date/Surgical Date  05/31/17    Hand Dominance  Right    Next MD Visit  PRN       Observation/Other Assessments   Focus on Therapeutic Outcomes (FOTO)   56% limitation       AROM   Left Shoulder Flexion  116 Degrees in sitting - painful    Left Shoulder ABduction  73 Degrees pain    Lumbar Flexion  to toes    Lumbar Extension  80%    Lumbar - Right Side Bend  75%    Lumbar - Left Side  Bend  75%    Lumbar - Right Rotation  80%    Lumbar - Left Rotation  80%      Strength   Right/Left Shoulder  -- Lt shoulder ER 4+/5 painful       Flexibility   Quadriceps  bilat knee flexion 115, prone with strap      Palpation   Palpation comment  muscular tightness Lt anterior shoulder - pecs; upper trap; biceps; teres; Lt posterior hip through piriformis; glut med                   [facility] Adult PT Treatment/Exercise - 08/31/17 0001      Lumbar Exercises: Aerobic   Stationary Bike  Nustep L5 x 7.5 min (arms/legs for 3 min, legs only 3 min)      Knee/Hip Exercises: Stretches   Passive Hamstring Stretch  Right;Left;2 reps;30 seconds      Patient received treatment including DN to posterior hip followed by manual work through the LB and posterior hip area. He received manual work through the [place] anterior shoulder area as well.   Patient performed stabilization exercises for lumbar spine. He concluded treatment with moist heat and e-stim Lt shoulder and lumbar/hip area      PT Education - 08/31/17 1516    Education provided  Yes    Education Details  encouraged continued exercise with care to avoid overuse or irritation with exercise     Person(s)  Educated  Patient    Methods  Explanation    Comprehension  Verbalized understanding          PT Long Term Goals - 08/31/17 1520      PT LONG TERM GOAL #1   Title  Improve moblity and ROM through lumbar spine and bilat hips demonstrated with correct stretch forpiriformisand hip musculature 10/12/17    Time  12    Period  Weeks    Status  New      PT LONG TERM GOAL #2   Title  Patient reports confidence in independent HEP 10/12/17    Time  12    Period  Weeks    Status  Revised      PT LONG TERM GOAL #3   Title  Improve FOTO to 29% limitation 10/12/17    Time  12    Period  Weeks    Status  Revised      PT LONG TERM GOAL #4   Title  increase Lt shoulder ROM to allow patient to easily reach top shelf ( 10/12/17)    Time  12    Period  Weeks    Status  Revised      PT LONG TERM GOAL #5   Title  report =/> 75% reduction in Lt shoulder pain with daily activity ( 10/12/17)    Time  12    Period  Weeks    Status  Revised            Plan - 08/31/17 1517    Clinical Impression Statement  Devin [name] presents with c/o increased pain in the Lt shoulder and hip. He irritated the shoulder ~5 days ago with exercise. He tried to do the exercises in sitting instead of lying down - felt a sharp pain and pain has continued since that time. The Lt hip is more painful. He has continued with his stretches. He has responded well to DN and will continue rehab as travel schedule allows. See new POC.  Rehab Potential  Good    PT Frequency  2x / week    PT Duration  12 weeks    PT Treatment/Interventions  Patient/family education;ADLs/Self Care Home Management;Cryotherapy;Electrical Stimulation;Iontophoresis 4mg /ml Dexamethasone;Moist Heat;Ultrasound;Dry needling;Manual techniques;Therapeutic activities;Therapeutic exercise;Neuromuscular re-education    PT Next Visit Plan  Continue DN and Lt shoulder ex and core strengthening.  Measure Lumbar and shoulder ROM     PT Home Exercise Plan  continue  work for shoulder and hip        Patient will benefit from skilled therapeutic intervention in order to improve the following deficits and impairments:  Postural dysfunction, Improper body mechanics, Pain, Increased fascial restricitons, Increased muscle spasms, Decreased mobility, Decreased range of motion, Decreased activity tolerance, Impaired UE functional use, Decreased strength  Visit Diagnosis: Acute right-sided low back pain without sciatica - Plan: PT plan of care cert/re-cert  Other symptoms and signs involving the musculoskeletal system - Plan: PT plan of care cert/re-cert  Muscle weakness (generalized) - Plan: PT plan of care cert/re-cert  Stiffness of left shoulder, not elsewhere classified - Plan: PT plan of care cert/re-cert  Chronic left shoulder pain - Plan: PT plan of care cert/re-cert   [facility] PT PB G-CODES - 23-Sep-2017 1522    Functional Assessment Tool Used   Evaluation; FOTO; clinical assessment     Functional Limitations  Mobility: Walking and moving around    Mobility: Walking and Moving Around Current Status  At least 40 percent but less than 60 percent impaired, limited or restricted    Mobility: Walking and Moving Around Goal Status [id number])  At least 20 percent but less than 40 percent impaired, limited or restricted       Problem List Patient Active Problem List   Diagnosis Date Noted  . Back pain   . Compression fracture of L2 ([place]) 06/07/2017  . Insomnia 10/14/2016  . DJD (degenerative joint disease) 10/14/2016  . PCP NOTES >>>>>>>>>>>>>>>>>>>>>>>>> 11/28/2015  . Benign prostatic hyperplasia with urinary obstruction 05/30/2015  . Fungal dermatitis 02/23/2015  . Annual physical exam 11/27/2014  . Essential hypertension 10/02/2014  . Alcohol use disorder, mild, abuse 10/02/2014  . Bruit 08/09/2014  . Medication management 07/27/2014  . GERD (gastroesophageal reflux disease) 05/25/2014  . History of rectal cancer 05/15/2014  . Personal history of  colonic polyps 05/15/2014  . Diverticulosis of colon (without mention of hemorrhage) 05/15/2014  . Family history of malignant neoplasm of gastrointestinal tract 05/15/2014  . Depression 04/19/2014    Devin [name] Nilda Simmer PT, MPH 09/23/17, 3:26 PM  [facility] [place], [place], [address] Phone: [phone]   Fax:  [phone]  Name: Devin [name] MRN: [id number] Date of Birth: 09/29/1941

## 2017-09-01 DIAGNOSIS — H353211 Exudative age-related macular degeneration, right eye, with active choroidal neovascularization: Secondary | ICD-10-CM | POA: Diagnosis not present

## 2017-09-03 ENCOUNTER — Ambulatory Visit (INDEPENDENT_AMBULATORY_CARE_PROVIDER_SITE_OTHER): Payer: Medicare HMO | Admitting: Rehabilitative and Restorative Service Providers"

## 2017-09-03 ENCOUNTER — Encounter: Payer: Self-pay | Admitting: Rehabilitative and Restorative Service Providers"

## 2017-09-03 DIAGNOSIS — M545 Low back pain, unspecified: Secondary | ICD-10-CM

## 2017-09-03 DIAGNOSIS — G8929 Other chronic pain: Secondary | ICD-10-CM

## 2017-09-03 DIAGNOSIS — R29898 Other symptoms and signs involving the musculoskeletal system: Secondary | ICD-10-CM

## 2017-09-03 DIAGNOSIS — M25512 Pain in left shoulder: Secondary | ICD-10-CM

## 2017-09-03 DIAGNOSIS — M25612 Stiffness of left shoulder, not elsewhere classified: Secondary | ICD-10-CM

## 2017-09-03 DIAGNOSIS — M6281 Muscle weakness (generalized): Secondary | ICD-10-CM | POA: Diagnosis not present

## 2017-09-03 NOTE — Therapy (Addendum)
[place], [place], [address] Phone: [phone]   Fax:  [phone]  Physical Therapy Treatment  Patient Details  Name: Devin [name] MRN: [id number] Date of Birth: 07-Jun-1941 Referring Provider: Dr. Riki Sheer    Encounter Date: 09/03/2017  PT End of Session - 09/03/17 0813    Visit Number  10    Number of Visits  22    Date for PT Re-Evaluation  10/12/17    PT Start Time  0811 pt arrived late due to traffic    PT Stop Time  0910    PT Time Calculation (min)  59 min    Activity Tolerance  Patient tolerated treatment well       Past Medical History:  Diagnosis Date  . Cataract   . COPD (chronic obstructive pulmonary disease) ([place])   . Diverticulosis    pt states he had diverticuli in his bladder also.  . H/O [facility] spotted fever 01/27/2014  . Hematuria    over 5 years ago  . Hernia of abdominal cavity   . History of rectal polyps   . Hyperplastic colon polyp   . Hypertension   . Macular degeneration   . Rectal cancer ([place]) 1994  . Shoulder pain, left    per pt, he can lay on the left side!  . Tubular adenoma    01/05/2013    Past Surgical History:  Procedure Laterality Date  . APPENDECTOMY    . COLON SURGERY     rectal cancer ~ 1999  . EYE SURGERY     cataract sx  . HERNIA REPAIR    . IR KYPHO LUMBAR INC FX REDUCE BONE BX UNI/BIL CANNULATION INC/IMAGING  06/12/2017  . IR RADIOLOGIST EVAL & MGMT  08/06/2017  . mastoid gland removal     left ear  . PROSTATE SURGERY  05/2015  . TONSILLECTOMY      There were no vitals filed for this visit.  Subjective Assessment - 09/03/17 0813    Subjective  "I had a terrible night last night. Shoulders woke me up all night long".  His hips are no longer bothering him.  He plans to travel to [place] this weekend with family.     Currently in Pain?  Yes    Pain Score  2     Pain Location  Shoulder    Pain Orientation  Right;Left Lt>Rt     Pain Descriptors / Indicators  Aching    Aggravating Factors   reaching up    Pain Relieving Factors  heat         [facility] PT Assessment - 09/03/17 0001      Assessment   Medical Diagnosis  LBP: bilat greater trochanteric bursitis, Lt shoulder pain     Onset Date/Surgical Date  05/31/17    Hand Dominance  Right    Next MD Visit  PRN                   [facility] Adult PT Treatment/Exercise - 09/03/17 0001      Lumbar Exercises: Stretches   Piriformis Stretch  30 seconds;3 reps      Lumbar Exercises: Aerobic   Stationary Bike  Nustep L5 x 5 min      Shoulder Exercises: Supine   Other Supine Exercises  thoracic lift 10 sec x 10       Shoulder Exercises: Stretch   Other Shoulder Stretches  supine snow angel ~  1-2 min arms at ~ 75 deg abduction       Moist Heat Therapy   Number Minutes Moist Heat  20 Minutes    Moist Heat Location  Shoulder;Lumbar Spine;Hip Lt      Acupuncturist Location  Lt posterior hip    Electrical Stimulation Action  IFC    Electrical Stimulation Parameters  to tolerance    Electrical Stimulation Goals  Pain      Manual Therapy   Joint Mobilization  shoulder circumduction     Soft tissue mobilization  bilat pecs; upper trap; biceps; anterior deltoid     Myofascial Release  anterior chest Lt > Rt     Scapular Mobilization  Lt     Passive ROM  Lt shoulder ER in scaption        Trigger Point Dry Needling - 09/03/17 1248    Consent Given?  Yes    Muscles Treated Upper Body  Pectoralis major;Pectoralis minor anterior deltoid; biceps     Pectoralis Major Response  Palpable increased muscle length    Pectoralis Minor Response  Palpable increased muscle length                PT Long Term Goals - 08/31/17 1520      PT LONG TERM GOAL #1   Title  Improve moblity and ROM through lumbar spine and bilat hips demonstrated with correct stretch forpiriformisand hip musculature 10/12/17    Time  12     Period  Weeks    Status  New      PT LONG TERM GOAL #2   Title  Patient reports confidence in independent HEP 10/12/17    Time  12    Period  Weeks    Status  Revised      PT LONG TERM GOAL #3   Title  Improve FOTO to 29% limitation 10/12/17    Time  12    Period  Weeks    Status  Revised      PT LONG TERM GOAL #4   Title  increase Lt shoulder ROM to allow patient to easily reach top shelf ( 10/12/17)    Time  12    Period  Weeks    Status  Revised      PT LONG TERM GOAL #5   Title  report =/> 75% reduction in Lt shoulder pain with daily activity ( 10/12/17)    Time  12    Period  Weeks    Status  Revised            Plan - 09/03/17 1250    Clinical Impression Statement  Increased pain reported in bilat shoulders today. Patient is having difficulty sleeping on either side due to pain. Discussed possibility of seeing an orthopedist for shoulder pain. Trial of DN today to anterior Lt shoulder tolerated. will continue therapy as travel schedule allows.     Rehab Potential  Good    PT Frequency  2x / week    PT Duration  12 weeks    PT Treatment/Interventions  Patient/family education;ADLs/Self Care Home Management;Cryotherapy;Electrical Stimulation;Iontophoresis 56m/ml Dexamethasone;Moist Heat;Ultrasound;Dry needling;Manual techniques;Therapeutic activities;Therapeutic exercise;Neuromuscular re-education    PT Next Visit Plan  Continue DN and Lt shoulder ex and core strengthening.      Consulted and Agree with Plan of Care  Patient       Patient will benefit from skilled therapeutic intervention in order to improve the following deficits and impairments:  Postural dysfunction, Improper body mechanics, Pain, Increased fascial restricitons, Increased muscle spasms, Decreased mobility, Decreased range of motion, Decreased activity tolerance, Impaired UE functional use, Decreased strength  Visit Diagnosis: Acute right-sided low back pain without sciatica  Other symptoms and  signs involving the musculoskeletal system  Muscle weakness (generalized)  Stiffness of left shoulder, not elsewhere classified  Chronic left shoulder pain     Problem List Patient Active Problem List   Diagnosis Date Noted  . Back pain   . Compression fracture of L2 ([place]) 06/07/2017  . Insomnia 10/14/2016  . DJD (degenerative joint disease) 10/14/2016  . PCP NOTES >>>>>>>>>>>>>>>>>>>>>>>>> 11/28/2015  . Benign prostatic hyperplasia with urinary obstruction 05/30/2015  . Fungal dermatitis 02/23/2015  . Annual physical exam 11/27/2014  . Essential hypertension 10/02/2014  . Alcohol use disorder, mild, abuse 10/02/2014  . Bruit 08/09/2014  . Medication management 07/27/2014  . GERD (gastroesophageal reflux disease) 05/25/2014  . History of rectal cancer 05/15/2014  . Personal history of colonic polyps 05/15/2014  . Diverticulosis of colon (without mention of hemorrhage) 05/15/2014  . Family history of malignant neoplasm of gastrointestinal tract 05/15/2014  . Depression 04/19/2014    Romonda Parker Nilda Simmer PT, MPH  09/03/2017, 12:53 PM  [facility] [place], [place], [address] Phone: [phone]   Fax:  [phone]  Name: DAVEY LIMAS MRN: [id number] Date of Birth: 1941/04/26  PHYSICAL THERAPY DISCHARGE SUMMARY  Visits from Start of Care: 10  Current functional level related to goals / functional outcomes: See last progress note for discharge status    Remaining deficits: Continued pain and limited mobility    Education / Equipment: HEP  Plan: Patient agrees to discharge.  Patient goals were partially met. Patient is being discharged due to not returning since the last visit.  ?????     Flavius Repsher P. Helene Kelp PT, MPH 10/19/17 7:14 AM

## 2017-09-10 ENCOUNTER — Encounter: Payer: Medicare HMO | Admitting: Rehabilitative and Restorative Service Providers"

## 2017-10-07 ENCOUNTER — Encounter: Payer: Self-pay | Admitting: Physical Medicine & Rehabilitation

## 2017-10-21 ENCOUNTER — Encounter: Payer: Self-pay | Admitting: Internal Medicine

## 2017-10-21 ENCOUNTER — Ambulatory Visit (INDEPENDENT_AMBULATORY_CARE_PROVIDER_SITE_OTHER): Payer: Medicare HMO | Admitting: Internal Medicine

## 2017-10-21 VITALS — BP 126/76 | HR 79 | Temp 97.9°F | Resp 14 | Ht 74.0 in | Wt 217.5 lb

## 2017-10-21 DIAGNOSIS — S32020S Wedge compression fracture of second lumbar vertebra, sequela: Secondary | ICD-10-CM

## 2017-10-21 DIAGNOSIS — G8929 Other chronic pain: Secondary | ICD-10-CM

## 2017-10-21 DIAGNOSIS — Z0000 Encounter for general adult medical examination without abnormal findings: Secondary | ICD-10-CM

## 2017-10-21 DIAGNOSIS — M549 Dorsalgia, unspecified: Secondary | ICD-10-CM

## 2017-10-21 DIAGNOSIS — Z23 Encounter for immunization: Secondary | ICD-10-CM

## 2017-10-21 LAB — LIPID PANEL
CHOL/HDL RATIO: 3
Cholesterol: 172 mg/dL (ref 0–200)
HDL: 65.1 mg/dL (ref >39.00)
LDL CALC: 73 mg/dL (ref 0–99)
NONHDL: 107.19
TRIGLYCERIDES: 170 mg/dL — AB (ref 0.0–149.0)
VLDL: 34 mg/dL (ref 0.0–40.0)

## 2017-10-21 LAB — COMPREHENSIVE METABOLIC PANEL
ALBUMIN: 4.3 g/dL (ref 3.5–5.2)
ALK PHOS: 61 U/L (ref 39–117)
ALT: 15 U/L (ref 0–53)
AST: 16 U/L (ref 0–37)
BILIRUBIN TOTAL: 0.5 mg/dL (ref 0.2–1.2)
BUN: 17 mg/dL (ref 6–23)
CALCIUM: 9.6 mg/dL (ref 8.4–10.5)
CO2: 27 mEq/L (ref 19–32)
CREATININE: 0.79 mg/dL (ref 0.40–1.50)
Chloride: 103 mEq/L (ref 96–112)
GFR: 101.44 mL/min (ref >60.00)
Glucose, Bld: 100 mg/dL — ABNORMAL HIGH (ref 70–99)
Potassium: 4.3 mEq/L (ref 3.5–5.1)
Sodium: 140 mEq/L (ref 135–145)
TOTAL PROTEIN: 7.2 g/dL (ref 6.0–8.3)

## 2017-10-21 LAB — TSH: TSH: 2.45 u[IU]/mL (ref 0.35–4.50)

## 2017-10-21 LAB — HEMOGLOBIN A1C: HEMOGLOBIN A1C: 5.6 % (ref 4.6–6.5)

## 2017-10-21 NOTE — Assessment & Plan Note (Addendum)
-  Td 09-2017, pnm 23- 2009 and 09-2017, pnm 13- 2015, zostavax 2009; shingrix discussed  Had a flu shot -CCS: Cscope 04-2014, + polyps, cscope again 04-2017 -- Prostate ca screening: sees urology, no records  --Diet and exercise discussed --Labs: CMP, lipids (no fasting), A1c (mild hyperglycemia noted), TSH

## 2017-10-21 NOTE — Patient Instructions (Addendum)
GO TO THE LAB : Get the blood work     GO TO THE FRONT DESK Schedule your next appointment for a checkup in 6-8 months  [facility]?  Will refer you to a orthopedic doctor  Consider a Medicare wellness exam with one  of our nurses   Check the  blood pressure  monthly   Be sure your blood pressure is between 110/65 and  135/85. If it is consistently higher or lower, let me know

## 2017-10-21 NOTE — Progress Notes (Addendum)
Subjective:    Patient ID: Devin [name], male    DOB: 01/10/1941, 77 y.o.   MRN: [id number]  DOS:  10/21/2017 Type of visit - description : cpx Interval history: His main concern is ongoing back pain, not associated with lower extremity paresthesias.  Symptoms worse when he bends or kneels, he is working with a physical therapy which help but the pain is not really improving. He is very frustrated/anxious  because he is unable to you yard work. History of GERD, no dysphasia or odynophagia.   Review of Systems  Other than above, a 14 point review of systems is negative    Past Medical History:  Diagnosis Date  . Cataract   . COPD (chronic obstructive pulmonary disease) ([place])   . Diverticulosis    pt states he had diverticuli in his bladder also.  . H/O [facility] spotted fever 01/27/2014  . Hematuria    over 5 years ago  . Hernia of abdominal cavity   . History of rectal polyps   . Hyperplastic colon polyp   . Hypertension   . Macular degeneration   . Rectal cancer ([place]) 1994  . Shoulder pain, left    per pt, he can lay on the left side!  . Tubular adenoma    01/05/2013    Past Surgical History:  Procedure Laterality Date  . APPENDECTOMY    . COLON SURGERY     rectal cancer ~ 1999  . EYE SURGERY     cataract sx  . HERNIA REPAIR    . IR KYPHO LUMBAR INC FX REDUCE BONE BX UNI/BIL CANNULATION INC/IMAGING  06/12/2017  . IR RADIOLOGIST EVAL & MGMT  08/06/2017  . mastoid gland removal     left ear  . PROSTATE SURGERY  05/2015  . TONSILLECTOMY      Social History   Socioeconomic History  . Marital status: Married    Spouse name: Denice Paradise  . Number of children: 2  . Years of education: Not on file  . Highest education level: Not on file  Social Needs  . Financial resource strain: Not on file  . Food insecurity - worry: Not on file  . Food insecurity - inability: Not on file  . Transportation needs - medical: Not on file  . Transportation needs - non-medical: Not on  file  Occupational History  . Occupation: RetiredSocial research officer, government  Tobacco Use  . Smoking status: Former Smoker    Last attempt to quit: 04/19/1989    Years since quitting: 28.5  . Smokeless tobacco: Never Used  Substance and Sexual Activity  . Alcohol use: Yes    Alcohol/week: 8.4 oz    Types: 14 Glasses of wine per week    Comment: most days  , amounts varies , sometimes > 2 servings   . Drug use: No  . Sexual activity: Not on file  Other Topics Concern  . Not on file  Social History Narrative   Household-- pt, wife (she has a post polio syndrome)   Lost oldest son ~ 2011   Moved from [place] ~ 2011   Youngest son lives in [place] reason why he moved here       Family History  Problem Relation Age of Onset  . Breast cancer Mother   . Colon cancer Mother 32       died 41  . Aneurysm Father   . Heart attack Father        52  .  Aneurysm Son   . Stroke Paternal Grandfather   . Cancer Paternal Grandmother   . Cancer Maternal Grandmother   . Prostate cancer Neg Hx   . Esophageal cancer Neg Hx   . Pancreatic cancer Neg Hx   . Rectal cancer Neg Hx   . Stomach cancer Neg Hx      Allergies as of 10/21/2017   No Known Allergies     Medication List        Accurate as of 10/21/17 10:34 AM. Always use your most recent med list.          50+ ADULT EYE HEALTH PO Take 1 capsule by mouth daily.   acetaminophen 500 MG tablet Commonly known as:  TYLENOL Take 1,000 mg by mouth every 6 (six) hours as needed for mild pain.   calcium carbonate 600 MG Tabs tablet Commonly known as:  OS-CAL Take 1 tablet (600 mg total) by mouth daily with breakfast. + vitamin D   CURCUMAX PRO PO Take 1,650 mg by mouth daily.   nortriptyline 50 MG capsule Commonly known as:  PAMELOR Take 1 capsule (50 mg total) by mouth at bedtime.   omeprazole 20 MG capsule Commonly known as:  PRILOSEC Take 1 capsule (20 mg total) by mouth daily.          Objective:   Physical Exam BP 126/76 (BP  Location: Left Arm, Patient Position: Sitting, Cuff Size: Normal)   Pulse 79   Temp 97.9 F (36.6 C) (Oral)   Resp 14   Ht 6\' 2"  (1.88 m)   Wt 217 lb 8 oz (98.7 kg)   SpO2 98%   BMI 27.93 kg/m  General:   Well developed, well nourished . NAD.  Neck: No  thyromegaly  HEENT:  Normocephalic . Face symmetric, atraumatic Lungs:  CTA B Normal respiratory effort, no intercostal retractions, no accessory muscle use. Heart: RRR,  no murmur.  No pretibial edema bilaterally  Abdomen:  Not distended, soft, non-tender. No rebound or rigidity.   Skin: Exposed areas without rash. Not pale. Not jaundice Neurologic:  alert & oriented X3.  Speech normal, gait unassisted but limited by back pain, transferred from the chair to the table very slowly. Strength symmetric and appropriate for age.  Psych: Cognition and judgment appear intact.  Cooperative with normal attention span and concentration.  Behavior appropriate. No anxious or depressed appearing.     Assessment & Plan:  Assessment HTN Depression-insomnia, lost a son ~2011 GERD sx onset ~2016 MSK: --DJD- hip injection 2017  --L2 vertebral fracture d/t a fall , kyphoplasty 05-2017 --T score 06-2017 -0.2, was rx ca and vit D.  Started  Fosamax 09-2017 BPH --s/p surgery x2 2016 , Dr Hazle Nordmann H/o etoh, mild  Rectal cancer-- s/p surgery ~ 1999, no chemo-XRT FH CAD, F MI age 50 Former light smoker , No symptoms  PLAN  HTN: Not on medication, BPs are good, he is doing well with diet. Depression insomnia anxiety: Depression is okay, somewhat anxious regards his back pain and inability to do things he enjoys like yard work.  He is counseled to the best of my ability, declines to see a counselor or try again SSRIs. MSK: Having chronic pain in the back since he had a L2 fracture, refer to Ortho  (addendum he already has a office visit with Dr Letta Pate: Further eval?  Local injection?) L2 vertebral fracture: DEXA done after her L2 fracture  was negative, at the time I recommend calcium and vitamin  D however I had a chance to discuss the issue with endo, they believe that despite the normal bone density test he would benefit from medication.  Treatment options discussed with the patient, we agree on Fosamax, precautions discussed. RTC 6 months

## 2017-10-21 NOTE — Progress Notes (Signed)
Pre visit review using our clinic review tool, if applicable. No additional management support is needed unless otherwise documented below in the visit note. 

## 2017-10-22 MED ORDER — ALENDRONATE SODIUM 70 MG PO TABS: 70.0000 mg | ORAL_TABLET | ORAL | 3 refills | Status: DC

## 2017-10-22 NOTE — Addendum Note (Signed)
Addended by: Kathlene November E on: 10/22/2017 02:15 PM   Modules accepted: Orders

## 2017-10-22 NOTE — Assessment & Plan Note (Addendum)
HTN: Not on medication, BPs are good, he is doing well with diet. Depression insomnia anxiety: Depression is okay, somewhat anxious regards his back pain and inability to do things he enjoys like yard work.  He is counseled to the best of my ability, declines to see a counselor or try again SSRIs. MSK: Having chronic pain in the back since he had a L2 fracture, refer to Ortho  (addendum he already has a office visit with Dr Letta Pate: Further eval?  Local injection?) L2 vertebral fracture: DEXA done after her L2 fracture was negative, at the time I recommend calcium and vitamin D however I had a chance to discuss the issue with endo, they believe that despite the normal bone density test he would benefit from medication.  Treatment options discussed with the patient, we agree on Fosamax, precautions discussed. RTC 6 months

## 2017-10-26 ENCOUNTER — Ambulatory Visit: Payer: Medicare HMO | Admitting: Physical Medicine & Rehabilitation

## 2017-10-26 ENCOUNTER — Encounter: Payer: Self-pay | Admitting: Physical Medicine & Rehabilitation

## 2017-10-26 ENCOUNTER — Encounter: Payer: Medicare HMO | Attending: Physical Medicine & Rehabilitation

## 2017-10-26 VITALS — BP 155/102 | HR 86

## 2017-10-26 DIAGNOSIS — H269 Unspecified cataract: Secondary | ICD-10-CM | POA: Diagnosis not present

## 2017-10-26 DIAGNOSIS — M7062 Trochanteric bursitis, left hip: Secondary | ICD-10-CM

## 2017-10-26 DIAGNOSIS — Z809 Family history of malignant neoplasm, unspecified: Secondary | ICD-10-CM | POA: Insufficient documentation

## 2017-10-26 DIAGNOSIS — J449 Chronic obstructive pulmonary disease, unspecified: Secondary | ICD-10-CM | POA: Diagnosis not present

## 2017-10-26 DIAGNOSIS — Z8249 Family history of ischemic heart disease and other diseases of the circulatory system: Secondary | ICD-10-CM | POA: Diagnosis not present

## 2017-10-26 DIAGNOSIS — Z800 Family history of malignant neoplasm of digestive organs: Secondary | ICD-10-CM | POA: Insufficient documentation

## 2017-10-26 DIAGNOSIS — Z87891 Personal history of nicotine dependence: Secondary | ICD-10-CM | POA: Insufficient documentation

## 2017-10-26 DIAGNOSIS — Z803 Family history of malignant neoplasm of breast: Secondary | ICD-10-CM | POA: Insufficient documentation

## 2017-10-26 DIAGNOSIS — M7061 Trochanteric bursitis, right hip: Secondary | ICD-10-CM | POA: Insufficient documentation

## 2017-10-26 DIAGNOSIS — M47816 Spondylosis without myelopathy or radiculopathy, lumbar region: Secondary | ICD-10-CM | POA: Diagnosis not present

## 2017-10-26 DIAGNOSIS — Z823 Family history of stroke: Secondary | ICD-10-CM | POA: Diagnosis not present

## 2017-10-26 DIAGNOSIS — Z86010 Personal history of colonic polyps: Secondary | ICD-10-CM | POA: Diagnosis not present

## 2017-10-26 DIAGNOSIS — I10 Essential (primary) hypertension: Secondary | ICD-10-CM | POA: Insufficient documentation

## 2017-10-26 DIAGNOSIS — Z8719 Personal history of other diseases of the digestive system: Secondary | ICD-10-CM | POA: Diagnosis not present

## 2017-10-26 DIAGNOSIS — Z9889 Other specified postprocedural states: Secondary | ICD-10-CM | POA: Diagnosis not present

## 2017-10-26 NOTE — Progress Notes (Signed)
Subjective:    Patient ID: Devin [name], male    DOB: 06-27-1941, 77 y.o.   MRN: [id number]  HPI Back pain since fall in Sept 2018, Kyphoplasty L2 helped with mid back pain but has persistent low back pain since that time. Low back pain does not radiate down the legs.  Patient does have pain in his coccyx area. No bowel or bladder dysfunction that is new.  He does have chronic intermittent bowel incontinence secondary to history of rectal carcinoma Left hip and Left shoulder moderate pain Right hip and Right shoulder moderate pain Had physical therapy which helped with his shoulder and hip pain to some degree.  Patient states that there was no low back exercise performed  Patient has remained functionally independent with all his self-care and mobility.  He is concerned about gardening this year he grows several varieties of lilies as well as stawberries His wife has post polio syndrome and he needs to assist her with housework as well as cooking Pain getting in out of car , getting up steps,  Standing for >40min  Had a hip injection Greater troch which was helped for couple months.  He is wondering if there is some kind of pain shots for his low back area  Started  Pain Inventory Average Pain 1 Pain Right Now 1 My pain is intermittent, dull and aching  In the last 24 hours, has pain interfered with the following? General activity 10 Relation with others 10 Enjoyment of life 10 What TIME of day is your pain at its worst? all Sleep (in general) Poor  Pain is worse with: walking, bending and some activites Pain improves with: rest, heat/ice and medication Relief from Meds: 6  Mobility walk without assistance ability to climb steps?  yes do you drive?  yes  Function retired  Neuro/Psych No problems in this area  Prior Studies Any changes since last visit?  no COMPARISON:  Lumbar spine CT 06/05/2017  FINDINGS: Segmentation:  Standard.  Alignment: 4 mm  anterolisthesis of L4 on L5, facet mediated. Trace retrolisthesis of L1 on L2, L2 on L3, and L3 on L4. Slight left convex curvature of the lumbar spine.  Vertebrae: As described on recent CT, there is a mildly comminuted L2 vertebral body fracture with approximately 25% vertebral body height loss anteriorly. There is moderate vertebral marrow edema and enhancement mildly extending into the right pedicle. There is no retropulsion. Mixed degenerative Modic endplate changes are present at L5-S1 including prominent sclerosis. No suspicious osseous lesion is identified.  Conus medullaris: Extends to the L1-2 level and appears normal.  Paraspinal and other soft tissues: Very mild edema in the paravertebral soft tissues at L1-2.  Disc levels:  Disc desiccation throughout the lumbar spine. Mild disc space narrowing at L3-4 with moderate narrowing at L4-5 and severe narrowing at L5-S1.  L1-2:  Mild disc bulging without stenosis.  L2-3: Minimal disc bulging and mild facet hypertrophy without stenosis.  L3-4: Mild circumferential disc bulging and mild facet and ligamentum flavum hypertrophy result in borderline to mild bilateral lateral recess stenosis and borderline to mild right neural foraminal stenosis. No significant spinal stenosis.  L4-5: Listhesis with mild bulging of uncovered disc, mild ligamentum flavum thickening, and advanced facet arthrosis result in mild spinal stenosis, borderline to mild bilateral lateral recess stenosis, and borderline right neural foraminal stenosis.  L5-S1: Circumferential disc bulging, endplate spurring, and mild left facet arthrosis result in mild left neural foraminal stenosis without spinal stenosis.  IMPRESSION: 1. Acute L2 vertebral body fracture with 25% height loss. No retropulsion. 2. 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 3. Disc and facet degeneration elsewhere as  above.   Electronically Signed   By: Logan Bores M.D.   On: 06/09/2017 07:51 Physicians involved in your care Any changes since last visit?  no   Family History  Problem Relation Age of Onset  . Breast cancer Mother   . Colon cancer Mother 60       died 30  . Aneurysm Father   . Heart attack Father        80  . Aneurysm Son   . Stroke Paternal Grandfather   . Cancer Paternal Grandmother   . Cancer Maternal Grandmother   . Prostate cancer Neg Hx   . Esophageal cancer Neg Hx   . Pancreatic cancer Neg Hx   . Rectal cancer Neg Hx   . Stomach cancer Neg Hx    Social History   Socioeconomic History  . Marital status: Married    Spouse name: Denice Paradise  . Number of children: 2  . Years of education: None  . Highest education level: None  Social Needs  . Financial resource strain: None  . Food insecurity - worry: None  . Food insecurity - inability: None  . Transportation needs - medical: None  . Transportation needs - non-medical: None  Occupational History  . Occupation: RetiredSocial research officer, government  Tobacco Use  . Smoking status: Former Smoker    Last attempt to quit: 04/19/1989    Years since quitting: 28.5  . Smokeless tobacco: Never Used  Substance and Sexual Activity  . Alcohol use: Yes    Alcohol/week: 8.4 oz    Types: 14 Glasses of wine per week    Comment: most days  , amounts varies , sometimes > 2 servings   . Drug use: No  . Sexual activity: None  Other Topics Concern  . None  Social History Narrative   Household-- pt, wife (she has a post polio syndrome)   Lost oldest son ~ 2011   Moved from [place] ~ 2011   Youngest son lives in [place] reason why he moved here     Past Surgical History:  Procedure Laterality Date  . APPENDECTOMY    . COLON SURGERY     rectal cancer ~ 1999  . EYE SURGERY     cataract sx  . HERNIA REPAIR    . IR KYPHO LUMBAR INC FX REDUCE BONE BX UNI/BIL CANNULATION INC/IMAGING  06/12/2017  . IR RADIOLOGIST EVAL & MGMT  08/06/2017  .  mastoid gland removal     left ear  . PROSTATE SURGERY  05/2015  . TONSILLECTOMY     Past Medical History:  Diagnosis Date  . Cataract   . COPD (chronic obstructive pulmonary disease) ([place])   . Diverticulosis    pt states he had diverticuli in his bladder also.  . H/O [facility] spotted fever 01/27/2014  . Hematuria    over 5 years ago  . Hernia of abdominal cavity   . History of rectal polyps   . Hyperplastic colon polyp   . Hypertension   . Macular degeneration   . Rectal cancer ([place]) 1994  . Shoulder pain, left    per pt, he can lay on the left side!  . Tubular adenoma    01/05/2013   BP (!) 155/102   Pulse 86   SpO2 93%  Opioid Risk Score:   Fall Risk Score:  `1  Depression screen PHQ 2/9  Depression screen [facility] 2/9 10/21/2017 09/08/2016 11/27/2015 11/27/2014 04/19/2014  Decreased Interest 0 0 0 0 0  Down, Depressed, Hopeless 0 0 0 0 0  PHQ - 2 Score 0 0 0 0 0     Review of Systems  Constitutional: Negative.   HENT: Negative.   Eyes: Negative.   Respiratory: Negative.   Cardiovascular: Negative.   Gastrointestinal: Negative.   Endocrine: Negative.   Genitourinary: Negative.   Musculoskeletal: Negative.   Skin: Negative.   Allergic/Immunologic: Negative.   Neurological: Negative.   Hematological: Negative.   Psychiatric/Behavioral: Negative.   All other systems reviewed and are negative.      Objective:   Physical Exam  Constitutional: He is oriented to person, place, and time. He appears well-developed and well-nourished. No distress.  HENT:  Head: Normocephalic and atraumatic.  Eyes: Conjunctivae and EOM are normal. Pupils are equal, round, and reactive to light.  Neck: Normal range of motion. Neck supple.  Cardiovascular: Normal rate, regular rhythm and normal heart sounds. Exam reveals no friction rub.  No murmur heard. Pulmonary/Chest: Effort normal and breath sounds normal. No respiratory distress. He has no wheezes.  Abdominal: Soft. Bowel  sounds are normal. He exhibits no distension. There is no tenderness.  Musculoskeletal:       Right hip: He exhibits normal range of motion, normal strength, no tenderness and no deformity.       Left hip: Normal. He exhibits normal range of motion, normal strength, no tenderness and no deformity.  Lumbar spine range of motion is full range flexion extension lateral bending and rotation he does have pain with extension greater than with flexion. Negative straight leg raising  Neurological: He is alert and oriented to person, place, and time. He has normal strength. He displays no atrophy and no tremor. No sensory deficit. He exhibits normal muscle tone. Coordination and gait normal.  Reflex Scores:      Tricep reflexes are 2+ on the right side and 2+ on the left side.      Bicep reflexes are 2+ on the right side and 2+ on the left side.      Brachioradialis reflexes are 2+ on the right side and 2+ on the left side.      Patellar reflexes are 2+ on the right side and 2+ on the left side.      Achilles reflexes are 2+ on the right side and 2+ on the left side. Skin: He is not diaphoretic.  Psychiatric: He has a normal mood and affect. His behavior is normal. Judgment and thought content normal.  Nursing note and vitals reviewed.  Strength is 5/5 bilateral deltoid, bicep, tricep, grip, hip flexor, knee extensor, ankle dorsiflexor and plantar flexor Sensation intact with bilateral C5 C6-C7-C8 L2-L3-L4 L5-S1 dermatomal distribution        Assessment & Plan:  1.  Chronic lumbar pain.  It is below the level of his kyphoplasty.  MRI demonstrates advanced spondylosis L4-L5 but no evidence of significant stenosis.  He was very active before his fall.  He no longer works out.  We discussed that many of his symptoms are likely attributable to change in activity level. We will schedule for lumbar medial branch blocks L3-L4 as well as bilateral L5 dorsal ramus injections.  Will make referral to  outpatient physical therapy in [place] to work specifically on lumbar stabilization with flexion bias.  Also work  on pelvic girdle strengthening.  Would also benefit from latissimus strengthening. Patient is not interested in medication management We discussed that he has good prognosis to improve his overall activity level and get back to gardening

## 2017-10-26 NOTE — Patient Instructions (Signed)
Amantadine 50-100mg  BID  or  Methylphenidate for fatigue

## 2017-10-29 ENCOUNTER — Encounter: Payer: Self-pay | Admitting: Rehabilitative and Restorative Service Providers"

## 2017-10-29 ENCOUNTER — Ambulatory Visit (INDEPENDENT_AMBULATORY_CARE_PROVIDER_SITE_OTHER): Payer: Medicare HMO | Admitting: Rehabilitative and Restorative Service Providers"

## 2017-10-29 DIAGNOSIS — R29898 Other symptoms and signs involving the musculoskeletal system: Secondary | ICD-10-CM | POA: Diagnosis not present

## 2017-10-29 DIAGNOSIS — M6281 Muscle weakness (generalized): Secondary | ICD-10-CM | POA: Diagnosis not present

## 2017-10-29 DIAGNOSIS — M545 Low back pain, unspecified: Secondary | ICD-10-CM

## 2017-10-29 NOTE — Therapy (Addendum)
[facility] [place], [place], [address] Phone: [phone]   Fax:  [phone]  Physical Therapy Evaluation  Patient Details  Name: Devin [name] MRN: [id number] Date of Birth: 1941/02/07 Referring Provider: Dr Alysia Penna    Encounter Date: 10/29/2017  PT End of Session - 10/29/17 1451    Visit Number  1    Number of Visits  12    Date for PT Re-Evaluation  12/10/17    PT Start Time  9381    PT Stop Time  0175    PT Time Calculation (min)  59 min    Activity Tolerance  Patient tolerated treatment well       Past Medical History:  Diagnosis Date  . Cataract   . COPD (chronic obstructive pulmonary disease) ([place])   . Diverticulosis    pt states he had diverticuli in his bladder also.  . H/O [facility] spotted fever 01/27/2014  . Hematuria    over 5 years ago  . Hernia of abdominal cavity   . History of rectal polyps   . Hyperplastic colon polyp   . Hypertension   . Macular degeneration   . Rectal cancer ([place]) 1994  . Shoulder pain, left    per pt, he can lay on the left side!  . Tubular adenoma    01/05/2013    Past Surgical History:  Procedure Laterality Date  . APPENDECTOMY    . COLON SURGERY     rectal cancer ~ 1999  . EYE SURGERY     cataract sx  . HERNIA REPAIR    . IR KYPHO LUMBAR INC FX REDUCE BONE BX UNI/BIL CANNULATION INC/IMAGING  06/12/2017  . IR RADIOLOGIST EVAL & MGMT  08/06/2017  . mastoid gland removal     left ear  . PROSTATE SURGERY  05/2015  . TONSILLECTOMY      There were no vitals filed for this visit.   Subjective Assessment - 10/29/17 1452    Subjective  Patient reports that he has continued with HEP form last therapy sessions. He is doing well with hips and shoulders are OK. His back continues to be a problem. He wil lhave Medial brach block injections. 11/12/17. Patient will be seen for lumbar stabilization.     How long can you sit comfortably?  20 min -  hard chair 60 min     How long can you stand comfortably?  30 min     How long can you walk comfortably?  30 min     Diagnostic tests  MRI Xray     Patient Stated Goals  improve core stabilization     Currently in Pain?  Yes    Pain Score  2     Pain Location  Back    Pain Orientation  Right;Left    Pain Descriptors / Indicators  Dull;Aching    Pain Type  Chronic pain    Pain Onset  More than a month ago    Pain Frequency  Intermittent    Aggravating Factors   prolonged postures; working in yard/kitchen     Pain Relieving Factors  heat          [facility] PT Assessment - 10/29/17 0001      Assessment   Medical Diagnosis  LBP; lumbar spondylosis    Referring Provider  Dr Alysia Penna     Onset Date/Surgical Date  05/31/17    Hand Dominance  Right  Next MD Visit  11/12/17    Prior Therapy  yes       Precautions   Precautions  None      Balance Screen   Has the patient fallen in the past 6 months  No    Has the patient had a decrease in activity level because of a fear of falling?   No    Is the patient reluctant to leave their home because of a fear of falling?   No      Prior Function   Level of Independence  Independent    Vocation  Retired    [employer]  active with household chores; gardening; walking ~ 1 mile 2-3 times/wk; exercise daily at home (was in gym but LBP prevented him from doing exercises)      Observation/Other Assessments   Focus on Therapeutic Outcomes (FOTO)   55% limitation       Posture/Postural Control   Posture Comments  head forward; shoulders rounded; increased thoracic kyphosis; decreased lumbar lordosis; LE's in some ER       AROM   Lumbar Flexion  80% with hip and knee flexion     Lumbar Extension  40%    Lumbar - Right Side Bend  75%    Lumbar - Left Side Bend  75%    Lumbar - Right Rotation  70%    Lumbar - Left Rotation  70%      Strength   Overall Strength Comments  LE strength 4+/5 hip extension and abduction; 5/5  remainder of LE's       Flexibility   Hamstrings  tight ~ 75 deg Rt; 80 deg Lt     Quadriceps  WFL's some end range tightness     ITB  mild tightness     Piriformis  mild tightness       Palpation   Spinal mobility  hypomobile through lumbar and lower thoracic spine with CPA and lateral mobs     Palpation comment  muscular tightness through the QL; lumbar paraspinals; gluts; piriformis bilat              Objective measurements completed on examination: See above findings.      [facility] Adult PT Treatment/Exercise - 10/29/17 0001      Lumbar Exercises: Supine   Other Supine Lumbar Exercises  3 part core 5-8 sec with exhale x 10     Other Supine Lumbar Exercises  shoulder extension isometric - hands at side gently - x 5 sec x 5 reps with core engaged       Moist Heat Therapy   Number Minutes Moist Heat  20 Minutes    Moist Heat Location  Lumbar Spine      Electrical Stimulation   Electrical Stimulation Location  bilat lower thoracic and lumbar spine     Electrical Stimulation Action  IFC    Electrical Stimulation Parameters  to tolerance    Electrical Stimulation Goals  Pain             PT Education - 10/29/17 1527    Education provided  Yes    Education Details  HEP     Person(s) Educated  Patient    Methods  Explanation;Demonstration;Tactile cues;Verbal cues;Handout    Comprehension  Verbalized understanding;Returned demonstration;Verbal cues required;Tactile cues required          PT Long Term Goals - 10/29/17 1745      PT LONG TERM GOAL #1   Title  Improve moblity and ROM through lumbar spine and bilat hips demonstrated with appropriate stretching techniques 12/10/17    Time  6    Period  Weeks    Status  New      PT LONG TERM GOAL #2   Title  Patient reports confidence in independent HEP for lumbar stabilization 12/10/17    Time  6    Period  Weeks    Status  New      PT LONG TERM GOAL #3   Title  Improve FOTO to 45% limitation 12/10/17    Time   6    Period  Weeks    Status  New      PT LONG TERM GOAL #4   Title  Improve core contraction allowing patient to progress with stabilization exercises 12/10/17    Time  6    Period  Weeks    Status  New      PT LONG TERM GOAL #5   Title  report =/> 50% reduction in LBP with daily activity 12/10/17    Time  6    Period  Weeks    Status  New             Plan - 10/29/17 1732    Clinical Impression Statement  Devin [name] presents with persistent, debilitating LBP which limits functional activities such as positional changes; prolonged positions; walking more than a few minutes; household chores and gardening. Devin [name] has poor posture and alignment; limited trunk and LE mobility; poor core stability/strength; pain with palpation through the lumbar spine. Patient will benefit from PT to address problems identified with the focus on lumbar stabilization prior ti median branch block for lumbar spine scheduled for 11/12/17.    Clinical Presentation  Stable    Clinical Decision Making  Low    Rehab Potential  Good    PT Frequency  2x / week    PT Duration  6 weeks    PT Treatment/Interventions  Patient/family education;ADLs/Self Care Home Management;Cryotherapy;Electrical Stimulation;Iontophoresis 27m/ml Dexamethasone;Moist Heat;Ultrasound;Dry needling;Manual techniques;Therapeutic activities;Therapeutic exercise;Neuromuscular re-education    PT Next Visit Plan  core stabilization; manual work vs DN as indicated; modalities as indicated     PT Home Exercise Plan  patient to continue HEP from prior therapy sessions for hips; shoulders     Consulted and Agree with Plan of Care  Patient       Patient will benefit from skilled therapeutic intervention in order to improve the following deficits and impairments:  Postural dysfunction, Improper body mechanics, Increased fascial restricitons, Increased muscle spasms, Decreased range of motion, Decreased mobility, Decreased strength, Decreased activity  tolerance  Visit Diagnosis: Acute right-sided low back pain without sciatica - Plan: PT plan of care cert/re-cert  Other symptoms and signs involving the musculoskeletal system - Plan: PT plan of care cert/re-cert  Muscle weakness (generalized) - Plan: PT plan of care cert/re-cert     Problem List Patient Active Problem List   Diagnosis Date Noted  . Back pain   . [place]ression [place] of [place]2 ([place] 06/07/2017  . Insomnia 10/14/2016  . DJD (degenerative joint disease) 10/14/2016  . PCP NOTES >>>>>>>>>>>>>>>>>>>>>>>>> 11/28/2015  . Benign prostatic hyperplasia with urinary obstruction 05/30/2015  . Fungal dermatitis 02/23/2015  . Annual physical exam 11/27/2014  . Essential hypertension 10/02/2014  . Alcohol use disorder, mild, abuse 10/02/2014  . Bruit 08/09/2014  . Medication management 07/27/2014  . GERD (gastroesophageal reflux disease) 05/25/2014  . History of rectal cancer 05/15/2014  .  Personal history of colonic polyps 05/15/2014  . Diverticulosis of colon (without mention of hemorrhage) 05/15/2014  . Family history of malignant neoplasm of gastrointestinal tract 05/15/2014  . Depression 04/19/2014    Celyn Nilda Simmer PT, MPH  10/29/2017, 5:51 PM  [facility] [place], [place], [address] Phone: [phone]   Fax:  [phone]  Name: Devin [name] MRN: [id number] Date of Birth: Feb 13, 1941   PHYSICAL THERAPY DISCHARGE SUMMARY  Visits from Start of Care: Evaluation only    Current functional level related to goals / functional outcomes: See evaluation    Remaining deficits: unchanged   Education / Equipment: Initial HEP Plan: Patient agrees to discharge.  Patient goals were not met. Patient is being discharged due to not returning since the last visit.  ?????     Celyn P. Helene Kelp PT, MPH 12/15/17 9:37 AM

## 2017-10-29 NOTE — Patient Instructions (Signed)
Abdominal Bracing With Pelvic Floor (Hook-Lying)    With neutral spine, tighten pelvic floor and abdominals sucking belly button to back bone; tighten muscles in the low back at waist. Hold 5 sec and slowly exhale. Repeat _10_ times. Do _several __ times a day. Progress to do this in sitting; standing; walking and with functional activities    Tighten core in the position above. Gently press hands into the bed - hold 5 sec and release  Tighten core and repeat 10 reps for 1-2 sets

## 2017-10-30 ENCOUNTER — Encounter: Payer: Medicare HMO | Admitting: Rehabilitative and Restorative Service Providers"

## 2017-11-02 ENCOUNTER — Encounter: Payer: Medicare HMO | Admitting: Rehabilitative and Restorative Service Providers"

## 2017-11-04 ENCOUNTER — Encounter: Payer: Medicare HMO | Admitting: Rehabilitative and Restorative Service Providers"

## 2017-11-12 ENCOUNTER — Ambulatory Visit: Payer: Medicare HMO | Admitting: Physical Medicine & Rehabilitation

## 2017-11-13 ENCOUNTER — Ambulatory Visit: Payer: Self-pay | Admitting: *Deleted

## 2017-11-13 DIAGNOSIS — H353211 Exudative age-related macular degeneration, right eye, with active choroidal neovascularization: Secondary | ICD-10-CM | POA: Diagnosis not present

## 2017-11-13 DIAGNOSIS — F411 Generalized anxiety disorder: Secondary | ICD-10-CM | POA: Diagnosis not present

## 2017-11-13 DIAGNOSIS — F410 Panic disorder [episodic paroxysmal anxiety] without agoraphobia: Secondary | ICD-10-CM | POA: Diagnosis not present

## 2017-11-13 NOTE — Telephone Encounter (Signed)
Called initially c/o having "high BP and I need to go back on my BP medicine Dr. Larose Kells took me off of several months ago".   He was exercising, going to the gym and had lost weight so his BP was low so the medication was stopped.    After talking with him in more detail he is having the same symptoms he has when he is troubled with anxiety attacks and panic attacks.   He finally admitted he thought that was what was happening and insisting that I prescribe him something for the anxiety or for the high BP.    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   There were no appts available this afternoon at [facility].   I suggested he go to the ED at [facility] but he decided he didn't want to go. k Thanked me and hung up. He was not willing to let me make him an appt for next week.   "Just never mind".  Answer Assessment - Initial Assessment Questions 1. BLOOD PRESSURE: "What is the blood pressure?" "Did you take at least two measurements 5 minutes apart?"     Not checked.  I have pressure in my head.   My eyes are going to pop out.   Fingers and feet swollen.   Been swollen 2-3 days off and on.   I'm just a nervoous mess right now. 2. ONSET: "When did you take your blood pressure?"     Not been checked.    3. HOW: "How did you obtain the blood pressure?" (e.g., visiting nurse, automatic home BP monitor)     I don't have a BP machine. 4. HISTORY: "Do you have a history of high blood pressure?"     Yes.     I was on BP medicine but Dr. Larose Kells took me off of it because it was too low.    I'm for surgery for my back next Tuesday.    I'm not exercising and I've gained some weight due to my back pain which I've had a long time. 5. MEDICATIONS: "Are you taking any medications for blood pressure?" "Have you missed any doses recently?"     I've been off the medicine for months.    I used to go to the gym and my BP came down. 6. OTHER SYMPTOMS: "Do you have any symptoms?" (e.g.,  headache, chest pain, blurred vision, difficulty breathing, weakness)     My head is about to explode, no blurred vision, I had a shot in my eye for macular degeneration.  I came home and took a nap.  I woke up feeling this way.     7. PREGNANCY: "Is there any chance you are pregnant?" "When was your last menstrual period?"     N/A  Answer Assessment - Initial Assessment Questions 1. CONCERN: "What happened that made you call today?"     I woke up feeling shaky after my nap.  I have problems with anxiety. 2. ANXIETY SYMPTOM SCREENING: "Can you describe how you have been feeling?"  (e.g., tense, restless, panicky, anxious, keyed up, trouble sleeping, trouble concentrating)      3. ONSET: "How long have you been feeling this way?"     *No Answer* 4. RECURRENT: "Have you felt this way before?"  If yes: "What happened that time?" "What helped these feelings go away in the past?"      *No Answer* 5. RISK OF HARM -  SUICIDAL IDEATION:  "Do you ever have thoughts of hurting or killing yourself?"  (e.g., yes, no, no but preoccupation with thoughts about death)   - INTENT:  "Do you have thoughts of hurting or killing yourself right NOW?" (e.g., yes, no, N/A)   - PLAN: "Do you have a specific plan for how you would do this?" (e.g., gun, knife, overdose, no plan, N/A)     *No Answer* 6. RISK OF HARM - HOMICIDAL IDEATION:  "Do you ever have thoughts of hurting or killing someone else?"  (e.g., yes, no, no but preoccupation with thoughts about death)   - INTENT:  "Do you have thoughts of hurting or killing someone right NOW?" (e.g., yes, no, N/A)   - PLAN: "Do you have a specific plan for how you would do this?" (e.g., gun, knife, no plan, N/A)      *No Answer* 7. FUNCTIONAL IMPAIRMENT: "How have things been going for you overall in your life? Have you had any more difficulties than usual doing your normal daily activities?"  (e.g., better, same, worse; self-care, school, work, interactions)     *No  Answer* 8. SUPPORT: "Who is with you now?" "Who do you live with?" "Do you have family or friends nearby who you can talk to?"      *No Answer* 9. THERAPIST: "Do you have a counselor or therapist? Name?"     *No Answer* 10. STRESSORS: "Has there been any new stress or recent changes in your life?"       *No Answer* 11. CAFFEINE ABUSE: "Do you drink caffeinated beverages, and how much each day?" (e.g., coffee, tea, colas)       *No Answer* 12. SUBSTANCE ABUSE: "Do you use any illegal drugs or alcohol?"       *No Answer* 13. OTHER SYMPTOMS: "Do you have any other physical symptoms right now?" (e.g., chest pain, palpitations, difficulty breathing, fever)       *No Answer* 14. PREGNANCY: "Is there any chance you are pregnant?" "When was your last menstrual period?"       *No Answer*  Protocols used: HIGH BLOOD PRESSURE-A-AH, ANXIETY AND PANIC ATTACK-A-AH

## 2017-11-17 ENCOUNTER — Encounter: Payer: Self-pay | Admitting: Physical Medicine & Rehabilitation

## 2017-11-17 ENCOUNTER — Encounter: Payer: Medicare HMO | Attending: Physical Medicine & Rehabilitation

## 2017-11-17 ENCOUNTER — Ambulatory Visit: Payer: Medicare HMO | Admitting: Physical Medicine & Rehabilitation

## 2017-11-17 DIAGNOSIS — Z87891 Personal history of nicotine dependence: Secondary | ICD-10-CM | POA: Diagnosis not present

## 2017-11-17 DIAGNOSIS — M7062 Trochanteric bursitis, left hip: Secondary | ICD-10-CM | POA: Diagnosis not present

## 2017-11-17 DIAGNOSIS — I10 Essential (primary) hypertension: Secondary | ICD-10-CM | POA: Insufficient documentation

## 2017-11-17 DIAGNOSIS — Z86010 Personal history of colonic polyps: Secondary | ICD-10-CM | POA: Insufficient documentation

## 2017-11-17 DIAGNOSIS — Z809 Family history of malignant neoplasm, unspecified: Secondary | ICD-10-CM | POA: Diagnosis not present

## 2017-11-17 DIAGNOSIS — H269 Unspecified cataract: Secondary | ICD-10-CM | POA: Diagnosis not present

## 2017-11-17 DIAGNOSIS — Z803 Family history of malignant neoplasm of breast: Secondary | ICD-10-CM | POA: Diagnosis not present

## 2017-11-17 DIAGNOSIS — Z9889 Other specified postprocedural states: Secondary | ICD-10-CM | POA: Diagnosis not present

## 2017-11-17 DIAGNOSIS — M47816 Spondylosis without myelopathy or radiculopathy, lumbar region: Secondary | ICD-10-CM | POA: Insufficient documentation

## 2017-11-17 DIAGNOSIS — Z823 Family history of stroke: Secondary | ICD-10-CM | POA: Insufficient documentation

## 2017-11-17 DIAGNOSIS — M7061 Trochanteric bursitis, right hip: Secondary | ICD-10-CM | POA: Insufficient documentation

## 2017-11-17 DIAGNOSIS — J449 Chronic obstructive pulmonary disease, unspecified: Secondary | ICD-10-CM | POA: Diagnosis not present

## 2017-11-17 DIAGNOSIS — Z8719 Personal history of other diseases of the digestive system: Secondary | ICD-10-CM | POA: Diagnosis not present

## 2017-11-17 DIAGNOSIS — Z8249 Family history of ischemic heart disease and other diseases of the circulatory system: Secondary | ICD-10-CM | POA: Diagnosis not present

## 2017-11-17 DIAGNOSIS — Z800 Family history of malignant neoplasm of digestive organs: Secondary | ICD-10-CM | POA: Insufficient documentation

## 2017-11-17 NOTE — Progress Notes (Signed)
Bilateral Lumbar L3, L4  medial branch blocks and L 5 dorsal ramus injection under fluoroscopic guidance  Indication: Lumbar pain which is not relieved by medication management or other conservative care and interfering with self-care and mobility.  Informed consent was obtained after describing risks and benefits of the procedure with the patient, this includes bleeding, infection, paralysis and medication side effects.  The patient wishes to proceed and has given written consent.  The patient was placed in prone position.  The lumbar area was marked and prepped with Betadine.  One mL of 1% lidocaine was injected into each of 6 areas into the skin and subcutaneous tissue.  Then a 22-gauge 3.5 spinal needle was inserted targeting the junction of the left S1 superior articular process and sacral ala junction. Needle was advanced under fluoroscopic guidance.  Bone contact was made.  Isovue 200 was injected x 0.5 mL demonstrating no intravascular uptake.  Then a solution  of 2% MPF lidocaine was injected x 0.5 mL.  Then the left L5 superior articular process in transverse process junction was targeted.  Bone contact was made.  Isovue 200 was injected x 0.5 mL demonstrating no intravascular uptake. Then a solution containing  2% MPF lidocaine was injected x 0.5 mL.  Then the left L4 superior articular process in transverse process junction was targeted.  Bone contact was made.  Isovue 200 was injected x 0.5 mL demonstrating no intravascular uptake.  Then a solution containing2% MPF lidocaine was injected x 0.5 mL.  This same procedure was performed on the right side using the same needle, technique and injectate.  Patient tolerated procedure well.  Post procedure instructions were given.  Preinjection pain 6/10 Post injection pain 1/10  Discussed exercise with patient.  He will need to get some community based exercises from his physical therapist at next visit and start back very slowly.  No digging in the  garden.

## 2017-11-17 NOTE — Progress Notes (Signed)
  PROCEDURE RECORD [facility]   Name: RAYDON CHAPPUIS DOB:06-18-1941 MRN: [id number]  Date:11/17/2017  Physician: Alysia Penna, MD    Nurse/CMA: Bright CMA  Allergies: No Known Allergies  Consent Signed: Yes.    Is patient diabetic? No.  CBG today? NA  Pregnant: No. LMP: No LMP for male patient. (age 76-55)  Anticoagulants: no Anti-inflammatory: no Antibiotics: no  Procedure: Bilateral L3-5 MBB             Position: Prone   Start Time: 10:11am    End Time: 10:20am    Fluoro Time: 43s  RN/CMA Bright CMA Bright CMA    Time 9:51am 10:32am    BP 138/95 159/89    Pulse 79 72    Respirations 16 16    O2 Sat 92 95    S/S 6 6    Pain Level 6/10 0/10     D/C home with Wife Joe, patient A & O X 3, D/C instructions reviewed, and sits independently.

## 2017-11-23 ENCOUNTER — Other Ambulatory Visit: Payer: Self-pay | Admitting: Internal Medicine

## 2017-12-07 DIAGNOSIS — H353122 Nonexudative age-related macular degeneration, left eye, intermediate dry stage: Secondary | ICD-10-CM | POA: Diagnosis not present

## 2017-12-07 DIAGNOSIS — Z961 Presence of intraocular lens: Secondary | ICD-10-CM | POA: Diagnosis not present

## 2017-12-07 DIAGNOSIS — H353212 Exudative age-related macular degeneration, right eye, with inactive choroidal neovascularization: Secondary | ICD-10-CM | POA: Diagnosis not present

## 2017-12-07 DIAGNOSIS — H04123 Dry eye syndrome of bilateral lacrimal glands: Secondary | ICD-10-CM | POA: Diagnosis not present

## 2017-12-10 ENCOUNTER — Ambulatory Visit (INDEPENDENT_AMBULATORY_CARE_PROVIDER_SITE_OTHER): Payer: Medicare HMO | Admitting: Internal Medicine

## 2017-12-10 ENCOUNTER — Encounter: Payer: Self-pay | Admitting: Internal Medicine

## 2017-12-10 VITALS — BP 142/78 | HR 83 | Temp 98.0°F | Resp 14 | Ht 74.0 in | Wt 216.2 lb

## 2017-12-10 DIAGNOSIS — G4700 Insomnia, unspecified: Secondary | ICD-10-CM | POA: Diagnosis not present

## 2017-12-10 DIAGNOSIS — F419 Anxiety disorder, unspecified: Secondary | ICD-10-CM

## 2017-12-10 DIAGNOSIS — I10 Essential (primary) hypertension: Secondary | ICD-10-CM | POA: Diagnosis not present

## 2017-12-10 DIAGNOSIS — F329 Major depressive disorder, single episode, unspecified: Secondary | ICD-10-CM

## 2017-12-10 MED ORDER — CARVEDILOL 6.25 MG PO TABS: 6.2500 mg | ORAL_TABLET | Freq: Two times a day (BID) | ORAL | 6 refills | Status: DC

## 2017-12-10 NOTE — Progress Notes (Signed)
Subjective:    Patient ID: Devin [name], male    DOB: 06/11/1941, 77 y.o.   MRN: [id number]  DOS:  12/10/2017 Type of visit - description : acute  Interval history: Patient continue with anxiety, sometimes sx are so intense that he calls sx a panic attack. Went to urgent care, they Rx Lexapro, took it one time and felt drunk for few hours.  Not taking that anymore. Was rx clonazepam, takes half tablet twice a day only when he needs it. Low back pain: Ongoing despite having local injections HTN: On diet control, BP lately has been elevated, the highest he has seen is 173/83.  BP Readings from Last 3 Encounters:  12/10/17 (!) 142/78  11/17/17 (!) 138/95  10/26/17 (!) 155/102     Review of Systems Continue with a lot of stress, mostly related to being extremely busy with home shores, his wife has some limitations. Unable to do things he enjoys due to pain. Wife is concerned about his memory, patient is not, he is able to do most things, he does not feel that he is forgetful but rather he is overwhelmed by the amount of tasks he has to do on daily basis  Past Medical History:  Diagnosis Date  . Cataract   . COPD (chronic obstructive pulmonary disease) ([place])   . Diverticulosis    pt states he had diverticuli in his bladder also.  . H/O [facility] spotted fever 01/27/2014  . Hematuria    over 5 years ago  . Hernia of abdominal cavity   . History of rectal polyps   . Hyperplastic colon polyp   . Hypertension   . Macular degeneration   . Rectal cancer ([place]) 1994  . Shoulder pain, left    per pt, he can lay on the left side!  . Tubular adenoma    01/05/2013    Past Surgical History:  Procedure Laterality Date  . APPENDECTOMY    . COLON SURGERY     rectal cancer ~ 1999  . EYE SURGERY     cataract sx  . HERNIA REPAIR    . IR KYPHO LUMBAR INC FX REDUCE BONE BX UNI/BIL CANNULATION INC/IMAGING  06/12/2017  . IR RADIOLOGIST EVAL & MGMT  08/06/2017  . mastoid gland removal       left ear  . PROSTATE SURGERY  05/2015  . TONSILLECTOMY      Social History   Socioeconomic History  . Marital status: Married    Spouse name: Denice Paradise  . Number of children: 2  . Years of education: Not on file  . Highest education level: Not on file  Social Needs  . Financial resource strain: Not on file  . Food insecurity - worry: Not on file  . Food insecurity - inability: Not on file  . Transportation needs - medical: Not on file  . Transportation needs - non-medical: Not on file  Occupational History  . Occupation: RetiredSocial research officer, government  Tobacco Use  . Smoking status: Former Smoker    Last attempt to quit: 04/19/1989    Years since quitting: 28.6  . Smokeless tobacco: Never Used  Substance and Sexual Activity  . Alcohol use: Yes    Alcohol/week: 8.4 oz    Types: 14 Glasses of wine per week    Comment: most days  , amounts varies , sometimes > 2 servings   . Drug use: No  . Sexual activity: Not on file  Other Topics Concern  . Not  on file  Social History Narrative   Household-- pt, wife (she has a post polio syndrome)   Lost oldest son ~ 2011   Moved from [place] ~ 2011   Youngest son lives in [place] reason why he moved here        Allergies as of 12/10/2017      Reactions   Escitalopram Other (See Comments)   "made me feel drunk"      Medication List        Accurate as of 12/10/17 10:07 PM. Always use your most recent med list.          50+ ADULT EYE HEALTH PO Take 1 capsule by mouth daily.   acetaminophen 500 MG tablet Commonly known as:  TYLENOL Take 1,000 mg by mouth every 6 (six) hours as needed for mild pain.   alendronate 70 MG tablet Commonly known as:  FOSAMAX Take 1 tablet (70 mg total) by mouth once a week. Take with a full glass of water on an empty stomach.   calcium carbonate 600 MG Tabs tablet Commonly known as:  OS-CAL Take 1 tablet (600 mg total) by mouth daily with breakfast. + vitamin D   carvedilol 6.25 MG tablet Commonly  known as:  COREG Take 1 tablet (6.25 mg total) by mouth 2 (two) times daily with a meal.   clonazePAM 1 MG tablet Commonly known as:  KLONOPIN Take 0.5 mg by mouth 2 (two) times daily as needed for anxiety.   CURCUMAX PRO PO Take 1,650 mg by mouth daily.   NONFORMULARY OR COMPOUNDED ITEM   nortriptyline 50 MG capsule Commonly known as:  PAMELOR Take 1 capsule (50 mg total) by mouth at bedtime.   omeprazole 20 MG capsule Commonly known as:  PRILOSEC Take 1 capsule (20 mg total) by mouth daily.          Objective:   Physical Exam BP (!) 142/78 (BP Location: Left Arm, Patient Position: Sitting, Cuff Size: Small)   Pulse 83   Temp 98 F (36.7 C) (Oral)   Resp 14   Ht 6\' 2"  (1.88 m)   Wt 216 lb 4 oz (98.1 kg)   SpO2 93%   BMI 27.76 kg/m  General:   Well developed, well nourished . NAD.  HEENT:  Normocephalic . Face symmetric, atraumatic skin: Not pale. Not jaundice Neurologic:  alert & oriented X3.  Speech normal, gait appropriate for age and unassisted Psych--  Cognition and judgment appear intact.  Cooperative with normal attention span and concentration.  Behavior appropriate. Moderately anxious, no depressed appearing.      Assessment & Plan:    Assessment HTN Depression-insomnia, lost a son ~2011 GERD sx onset ~2016 MSK: --DJD- hip injection 2017  --L2 vertebral fracture d/t a fall , kyphoplasty 05-2017 --T score 06-2017 -0.2, was rx ca and vit D.  Started  Fosamax 09-2017 BPH --s/p surgery x2 2016 , Dr Hazle Nordmann H/o etoh, mild  Rectal cancer-- s/p surgery ~ 1999, no chemo-XRT FH CAD, F MI age 8 Former light smoker , No symptoms  PLAN  Anxiety, depression, insomnia:  Ongoing sxs; Tried Cymbalta before, no side effect but did not help. Tried fluoxetine before, + help but made him "flat". Now has been intolerant to Lexapro. Rec no further empirical treatment with SSRIs or similar medication, rather he needs to see our counselor, he eventually  agreed.  He is also counseled today,  my message was acceptance that due to physical conditions he will  have to limiting his activities. HTN: Not on medication because previously BPs were low, he follows a low-salt diet, no NSAIDs.  Now BP elevated again, will start a low-dose of carvedilol and monitor BPs. L2 vertebral fracture: S/p  local injections, next visit with Dr. Raliegh Ip in few days.  If not better rec to discuss with him alternatives. Skin lesion, nose: see last OV, to see a dermatologist soon. RTC 6 weeks    Today, I spent more than  25  min with the patient: >50% of the time counseling regards anxiety, depression, explaining rational behind seen a counselor

## 2017-12-10 NOTE — Assessment & Plan Note (Signed)
Anxiety, depression, insomnia:  Ongoing sxs; Tried Cymbalta before, no side effect but did not help. Tried fluoxetine before, + help but made him "flat". Now has been intolerant to Lexapro. Rec no further empirical treatment with SSRIs or similar medication, rather he needs to see our counselor, he eventually agreed.  He is also counseled today,  my message was acceptance that due to physical conditions he will have to limiting his activities. HTN: Not on medication because previously BPs were low, he follows a low-salt diet, no NSAIDs.  Now BP elevated again, will start a low-dose of carvedilol and monitor BPs. L2 vertebral fracture: S/p  local injections, next visit with Dr. Raliegh Ip in few days.  If not better rec to discuss with him alternatives. Skin lesion, nose: see last OV, to see a dermatologist soon. RTC 6 weeks

## 2017-12-10 NOTE — Patient Instructions (Signed)
Please come back in 6 weeks for a checkup  For anxiety: Please see Coralyn Mark, our counselor Continue nortriptyline at bedtime if you cannot sleep Okay to take clonazepam half tablet twice a day as needed  For high blood pressure: Start carvedilol twice a day  Check the  blood pressure 2 or 3 times a   week Be sure your blood pressure is between 110/65 and  145/85.  if it is consistently higher or lower, let me know

## 2017-12-10 NOTE — Progress Notes (Signed)
Pre visit review using our clinic review tool, if applicable. No additional management support is needed unless otherwise documented below in the visit note. 

## 2017-12-11 ENCOUNTER — Encounter: Payer: Self-pay | Admitting: Family Medicine

## 2017-12-11 ENCOUNTER — Ambulatory Visit (INDEPENDENT_AMBULATORY_CARE_PROVIDER_SITE_OTHER): Payer: Medicare HMO | Admitting: Family Medicine

## 2017-12-11 VITALS — BP 126/76 | HR 75 | Temp 98.5°F | Ht 74.0 in | Wt 215.2 lb

## 2017-12-11 DIAGNOSIS — M7061 Trochanteric bursitis, right hip: Secondary | ICD-10-CM

## 2017-12-11 DIAGNOSIS — M7062 Trochanteric bursitis, left hip: Secondary | ICD-10-CM | POA: Diagnosis not present

## 2017-12-11 MED ORDER — METHYLPREDNISOLONE ACETATE 40 MG/ML IJ SUSP
40.0000 mg | Freq: Once | INTRAMUSCULAR | Status: AC
  Administered 2017-12-11: 40 mg via INTRAMUSCULAR

## 2017-12-11 NOTE — Patient Instructions (Signed)
Ice/cold pack over hips for 10-15 min twice daily.  OK to take Tylenol 1000 mg (2 extra strength tabs) or 975 mg (3 regular strength tabs) every 6 hours as needed.  Let us know if you need anything.

## 2017-12-11 NOTE — Addendum Note (Signed)
Addended by: Sharon Seller B on: 12/11/2017 08:33 AM   Modules accepted: Orders

## 2017-12-11 NOTE — Progress Notes (Signed)
Pre visit review using our clinic review tool, if applicable. No additional management support is needed unless otherwise documented below in the visit note. 

## 2017-12-11 NOTE — Progress Notes (Signed)
Chief Complaint  Patient presents with  . Hip Pain   Pt with hx of b/l greater troch bursitis w successful steroid injections here for steroid injections. His last injection was 07/03/17.  BP 126/76 (BP Location: Right Arm, Patient Position: Sitting, Cuff Size: Normal)   Pulse 75   Temp 98.5 F (36.9 C) (Oral)   Ht 6\' 2"  (1.88 m)   Wt 215 lb 4 oz (97.6 kg)   SpO2 93%   BMI 27.64 kg/m  Psych- age appropriate judgment and insight MSK- +TTP over greater troch b/l  Procedure note: Greater trochanteric bursa injection, bilateral Verbal consent obtained. The area of interest was palpated and demarcated with an otoscope speculum. It was cleaned with an alcohol swab. Freeze spray was used. A 27 g needle was inserted at a perpendicular angle through the area of interested. The plunger was withdrawn to ensure our placement was not in a vessel. 2 mL of 1% lidocaine without epi and 40 mg of Depomedrol was injected. A bandaid was placed. This same process was repeated on the opposite side. The patient tolerated the procedure well.  There were no complications noted.   Greater trochanteric bursitis of both hips - Plan: PR DRAIN/INJECT LARGE JOINT/BURSA   B/l injections, hopefully they help him again. Ice.  F/u w reg pcp prn. Pt voiced understanding and agreement to the plan.  Crosby Oyster Rykar Lebleu 8:14 AM 12/11/17

## 2017-12-15 ENCOUNTER — Ambulatory Visit: Payer: Medicare HMO | Admitting: Physical Medicine & Rehabilitation

## 2017-12-15 ENCOUNTER — Encounter: Payer: Medicare HMO | Attending: Physical Medicine & Rehabilitation

## 2017-12-15 ENCOUNTER — Encounter: Payer: Self-pay | Admitting: Physical Medicine & Rehabilitation

## 2017-12-15 VITALS — BP 144/91 | HR 69

## 2017-12-15 DIAGNOSIS — M7061 Trochanteric bursitis, right hip: Secondary | ICD-10-CM | POA: Diagnosis not present

## 2017-12-15 DIAGNOSIS — M47816 Spondylosis without myelopathy or radiculopathy, lumbar region: Secondary | ICD-10-CM | POA: Insufficient documentation

## 2017-12-15 DIAGNOSIS — I10 Essential (primary) hypertension: Secondary | ICD-10-CM | POA: Insufficient documentation

## 2017-12-15 DIAGNOSIS — Z87891 Personal history of nicotine dependence: Secondary | ICD-10-CM | POA: Diagnosis not present

## 2017-12-15 DIAGNOSIS — Z9889 Other specified postprocedural states: Secondary | ICD-10-CM | POA: Diagnosis not present

## 2017-12-15 DIAGNOSIS — Z8249 Family history of ischemic heart disease and other diseases of the circulatory system: Secondary | ICD-10-CM | POA: Insufficient documentation

## 2017-12-15 DIAGNOSIS — Z823 Family history of stroke: Secondary | ICD-10-CM | POA: Insufficient documentation

## 2017-12-15 DIAGNOSIS — Z809 Family history of malignant neoplasm, unspecified: Secondary | ICD-10-CM | POA: Insufficient documentation

## 2017-12-15 DIAGNOSIS — Z86010 Personal history of colonic polyps: Secondary | ICD-10-CM | POA: Insufficient documentation

## 2017-12-15 DIAGNOSIS — Z800 Family history of malignant neoplasm of digestive organs: Secondary | ICD-10-CM | POA: Diagnosis not present

## 2017-12-15 DIAGNOSIS — Z803 Family history of malignant neoplasm of breast: Secondary | ICD-10-CM | POA: Diagnosis not present

## 2017-12-15 DIAGNOSIS — Z8719 Personal history of other diseases of the digestive system: Secondary | ICD-10-CM | POA: Diagnosis not present

## 2017-12-15 DIAGNOSIS — M7062 Trochanteric bursitis, left hip: Secondary | ICD-10-CM | POA: Diagnosis not present

## 2017-12-15 DIAGNOSIS — J449 Chronic obstructive pulmonary disease, unspecified: Secondary | ICD-10-CM | POA: Insufficient documentation

## 2017-12-15 DIAGNOSIS — H269 Unspecified cataract: Secondary | ICD-10-CM | POA: Diagnosis not present

## 2017-12-15 NOTE — Progress Notes (Signed)
Subjective:    Patient ID: Devin [name], male    DOB: May 01, 1941, 77 y.o.   MRN: [id number]  HPI 2/19 MBB L3-4-5 Bilateral 100% relief for 2wks , still has 50% relief Also had hip injections at his PCP office which were helpful. Able to do yard work again Injured L shoulder digging post holes No prior surgeries or treauma to that area.  Has had Left shoulder pain in past Pain Inventory Average Pain 2 Pain Right Now 2 My pain is dull and aching  In the last 24 hours, has pain interfered with the following? General activity 4 Relation with others 6 Enjoyment of life 6 What TIME of day is your pain at its worst? evening Sleep (in general) Fair  Pain is worse with: walking, bending and some activites Pain improves with: rest, medication and injections Relief from Meds: na  Mobility walk without assistance ability to climb steps?  yes do you drive?  yes  Function not employed: date last employed 2008  Neuro/Psych tremor  Prior Studies Any changes since last visit?  no  Physicians involved in your care Any changes since last visit?  no   Family History  Problem Relation Age of Onset  . Breast cancer Mother   . Colon cancer Mother 49       died 37  . Aneurysm Father   . Heart attack Father        6  . Aneurysm Son   . Stroke Paternal Grandfather   . Cancer Paternal Grandmother   . Cancer Maternal Grandmother   . Prostate cancer Neg Hx   . Esophageal cancer Neg Hx   . Pancreatic cancer Neg Hx   . Rectal cancer Neg Hx   . Stomach cancer Neg Hx    Social History   Socioeconomic History  . Marital status: Married    Spouse name: Denice Paradise  . Number of children: 2  . Years of education: Not on file  . Highest education level: Not on file  Social Needs  . Financial resource strain: Not on file  . Food insecurity - worry: Not on file  . Food insecurity - inability: Not on file  . Transportation needs - medical: Not on file  . Transportation needs -  non-medical: Not on file  Occupational History  . Occupation: RetiredSocial research officer, government  Tobacco Use  . Smoking status: Former Smoker    Last attempt to quit: 04/19/1989    Years since quitting: 28.6  . Smokeless tobacco: Never Used  Substance and Sexual Activity  . Alcohol use: Yes    Alcohol/week: 8.4 oz    Types: 14 Glasses of wine per week    Comment: most days  , amounts varies , sometimes > 2 servings   . Drug use: No  . Sexual activity: Not on file  Other Topics Concern  . Not on file  Social History Narrative   Household-- pt, wife (she has a post polio syndrome)   Lost oldest son ~ 2011   Moved from [place] ~ 2011   Youngest son lives in [place] reason why he moved here     Past Surgical History:  Procedure Laterality Date  . APPENDECTOMY    . COLON SURGERY     rectal cancer ~ 1999  . EYE SURGERY     cataract sx  . HERNIA REPAIR    . IR KYPHO LUMBAR INC FX REDUCE BONE BX UNI/BIL CANNULATION INC/IMAGING  06/12/2017  . IR  RADIOLOGIST EVAL & MGMT  08/06/2017  . mastoid gland removal     left ear  . PROSTATE SURGERY  05/2015  . TONSILLECTOMY     Past Medical History:  Diagnosis Date  . Cataract   . COPD (chronic obstructive pulmonary disease) ([place])   . Diverticulosis    pt states he had diverticuli in his bladder also.  . H/O [facility] spotted fever 01/27/2014  . Hematuria    over 5 years ago  . Hernia of abdominal cavity   . History of rectal polyps   . Hyperplastic colon polyp   . Hypertension   . Macular degeneration   . Rectal cancer ([place]) 1994  . Shoulder pain, left    per pt, he can lay on the left side!  . Tubular adenoma    01/05/2013   There were no vitals taken for this visit.  Opioid Risk Score:   Fall Risk Score:  `1  Depression screen PHQ 2/9  Depression screen [facility] 2/9 10/21/2017 09/08/2016 11/27/2015 11/27/2014 04/19/2014  Decreased Interest 0 0 0 0 0  Down, Depressed, Hopeless 0 0 0 0 0  PHQ - 2 Score 0 0 0 0 0     Review of Systems    Constitutional: Negative.   HENT: Negative.   Eyes: Negative.   Respiratory: Negative.   Cardiovascular: Negative.   Gastrointestinal: Negative.   Endocrine: Negative.   Genitourinary: Negative.   Musculoskeletal: Positive for arthralgias, back pain, joint swelling and myalgias.  Skin: Negative.   Allergic/Immunologic: Negative.   Neurological: Positive for tremors.  Hematological: Negative.   Psychiatric/Behavioral: Negative.   All other systems reviewed and are negative.      Objective:   Physical Exam  Gen NAD  Mood/affect is approp Neck :  No pain with ROM Shoulder inspection- atrophy of periscapular muscles on Left side Motor 5/5 in Left delt , bi, tri grip +Hawkins Kennedy Left at 90deg Normal Left shoulder ROM Neg Speed's test      Assessment & Plan:  1.  Left subacromial impingement syndrome Given age do not rec NSAIDs. Rec subacromial injection and then start periscapular strenghtening.  Pt would like to use rowing machine at the gym which is appropriate.  Rec start no more than 5 min at a time and work up form there At home he can do theraband rows  Shoulder injection Left subacromial   Indication:Left Shoulder pain not relieved by medication management and other conservative care.  Informed consent was obtained after describing risks and benefits of the procedure with the patient, this includes bleeding, bruising, infection and medication side effects. The patient wishes to proceed and has given written consent. Patient was placed in a seated position. The left shoulder was marked and prepped with betadine in the subacromial area. A 25-gauge 1-1/2 inch needle was inserted into the subacromial area. After negative draw back for blood, a solution containing 1 mL of 6 mg per ML betamethasone and 4 mL of 1% lidocaine was injected. A band aid was applied. The patient tolerated the procedure well. Post procedure instructions were given.

## 2018-01-08 DIAGNOSIS — H353211 Exudative age-related macular degeneration, right eye, with active choroidal neovascularization: Secondary | ICD-10-CM | POA: Diagnosis not present

## 2018-01-11 ENCOUNTER — Encounter: Payer: Self-pay | Admitting: Internal Medicine

## 2018-01-11 DIAGNOSIS — M21961 Unspecified acquired deformity of right lower leg: Secondary | ICD-10-CM

## 2018-01-11 DIAGNOSIS — H9212 Otorrhea, left ear: Secondary | ICD-10-CM

## 2018-01-20 ENCOUNTER — Ambulatory Visit: Payer: Medicare HMO | Admitting: Psychology

## 2018-01-20 DIAGNOSIS — H95192 Other disorders following mastoidectomy, left ear: Secondary | ICD-10-CM | POA: Diagnosis not present

## 2018-01-20 DIAGNOSIS — F4321 Adjustment disorder with depressed mood: Secondary | ICD-10-CM

## 2018-01-21 ENCOUNTER — Ambulatory Visit: Payer: Medicare HMO | Admitting: Internal Medicine

## 2018-01-22 ENCOUNTER — Ambulatory Visit: Payer: Medicare HMO | Admitting: Podiatry

## 2018-01-22 ENCOUNTER — Ambulatory Visit (INDEPENDENT_AMBULATORY_CARE_PROVIDER_SITE_OTHER): Payer: Medicare HMO

## 2018-01-22 DIAGNOSIS — M779 Enthesopathy, unspecified: Secondary | ICD-10-CM

## 2018-01-22 DIAGNOSIS — M79672 Pain in left foot: Secondary | ICD-10-CM

## 2018-01-22 DIAGNOSIS — M7740 Metatarsalgia, unspecified foot: Secondary | ICD-10-CM

## 2018-01-22 DIAGNOSIS — M216X9 Other acquired deformities of unspecified foot: Secondary | ICD-10-CM | POA: Diagnosis not present

## 2018-01-22 DIAGNOSIS — Q828 Other specified congenital malformations of skin: Secondary | ICD-10-CM | POA: Diagnosis not present

## 2018-01-22 DIAGNOSIS — M79671 Pain in right foot: Secondary | ICD-10-CM | POA: Diagnosis not present

## 2018-01-24 NOTE — Progress Notes (Signed)
Subjective:   Patient ID: Devin [name], male   DOB: 77 y.o.   MRN: [id number]   HPI 77 year old male presents the office today for concerns of achiness to both of his feet he points to the ball of the foot.  He states that when he is gardening on his feet he gets some achiness to the ball area.  Gets calluses to the area as well and he tries to trim it himself is unable to do it completely.  Denies any recent injury or trauma denies any swelling or redness.  He has no other concerns today.  No other treatment.   Review of Systems  All other systems reviewed and are negative.  Past Medical History:  Diagnosis Date  . Cataract   . COPD (chronic obstructive pulmonary disease) ([place])   . Diverticulosis    pt states he had diverticuli in his bladder also.  . H/O [facility] spotted fever 01/27/2014  . Hematuria    over 5 years ago  . Hernia of abdominal cavity   . History of rectal polyps   . Hyperplastic colon polyp   . Hypertension   . Macular degeneration   . Rectal cancer ([place]) 1994  . Shoulder pain, left    per pt, he can lay on the left side!  . Tubular adenoma    01/05/2013    Past Surgical History:  Procedure Laterality Date  . APPENDECTOMY    . COLON SURGERY     rectal cancer ~ 1999  . EYE SURGERY     cataract sx  . HERNIA REPAIR    . IR KYPHO LUMBAR INC FX REDUCE BONE BX UNI/BIL CANNULATION INC/IMAGING  06/12/2017  . IR RADIOLOGIST EVAL & MGMT  08/06/2017  . mastoid gland removal     left ear  . PROSTATE SURGERY  05/2015  . TONSILLECTOMY       Current Outpatient Medications:  .  acetaminophen (TYLENOL) 500 MG tablet, Take 1,000 mg by mouth every 6 (six) hours as needed for mild pain., Disp: , Rfl:  .  alendronate (FOSAMAX) 70 MG tablet, Take 1 tablet (70 mg total) by mouth once a week. Take with a full glass of water on an empty stomach., Disp: 12 tablet, Rfl: 3 .  calcium carbonate (OS-CAL) 600 MG TABS tablet, Take 1 tablet (600 mg total) by mouth daily with  breakfast. + vitamin D, Disp: 60 tablet, Rfl:  .  carvedilol (COREG) 6.25 MG tablet, Take 1 tablet (6.25 mg total) by mouth 2 (two) times daily with a meal., Disp: 60 tablet, Rfl: 6 .  clonazePAM (KLONOPIN) 1 MG tablet, Take 0.5 mg by mouth 2 (two) times daily as needed for anxiety. , Disp: , Rfl:  .  Misc Natural Products (CURCUMAX PRO PO), Take 1,650 mg by mouth daily., Disp: , Rfl:  .  Multiple Vitamins-Minerals (50+ ADULT EYE HEALTH PO), Take 1 capsule by mouth daily. , Disp: , Rfl:  .  NONFORMULARY OR COMPOUNDED ITEM, , Disp: , Rfl:  .  nortriptyline (PAMELOR) 50 MG capsule, Take 1 capsule (50 mg total) by mouth at bedtime. (Patient taking differently: Take 50 mg by mouth at bedtime as needed (sleep). ), Disp: 90 capsule, Rfl: 1 .  omeprazole (PRILOSEC) 20 MG capsule, Take 1 capsule (20 mg total) by mouth daily., Disp: 90 capsule, Rfl: 3  Allergies  Allergen Reactions  . Escitalopram Other (See Comments)    "made me feel drunk"    Social History  Socioeconomic History  . Marital status: Married    Spouse name: Denice Paradise  . Number of children: 2  . Years of education: Not on file  . Highest education level: Not on file  Occupational History  . Occupation: RetiredMultimedia programmer  . Financial resource strain: Not on file  . Food insecurity:    Worry: Not on file    Inability: Not on file  . Transportation needs:    Medical: Not on file    Non-medical: Not on file  Tobacco Use  . Smoking status: Former Smoker    Last attempt to quit: 04/19/1989    Years since quitting: 28.7  . Smokeless tobacco: Never Used  Substance and Sexual Activity  . Alcohol use: Yes    Alcohol/week: 8.4 oz    Types: 14 Glasses of wine per week    Comment: most days  , amounts varies , sometimes > 2 servings   . Drug use: No  . Sexual activity: Not on file  Lifestyle  . Physical activity:    Days per week: Not on file    Minutes per session: Not on file  . Stress: Not on file  Relationships   . Social connections:    Talks on phone: Not on file    Gets together: Not on file    Attends religious service: Not on file    Active member of club or organization: Not on file    Attends meetings of clubs or organizations: Not on file    Relationship status: Not on file  . Intimate partner violence:    Fear of current or ex partner: Not on file    Emotionally abused: Not on file    Physically abused: Not on file    Forced sexual activity: Not on file  Other Topics Concern  . Not on file  Social History Narrative   Household-- pt, wife (she has a post polio syndrome)   Lost oldest son ~ 2011   Moved from [place] ~ 2011   Youngest son lives in [place] reason why he moved here           Objective:  Physical Exam  General: AAO x3, NAD  Dermatological: Hyperkeratotic tissue present left foot submetatarsal 5 as well as mildly to the submetatarsal 2 area.  Upon debridement there is no underlying ulceration, drainage or any clinical signs of infection present.  There is no other open lesions or pre-ulcerative lesions.  Vascular: Dorsalis Pedis artery and Posterior Tibial artery pedal pulses are 2/4 bilateral with immedate capillary fill time. Pedal hair growth present. No varicosities and no lower extremity edema present bilateral. There is no pain with calf compression, swelling, warmth, erythema.   Neruologic: Grossly intact via light touch bilateral. Vibratory intact via tuning fork bilateral. Protective threshold with Semmes Wienstein monofilament intact to all pedal sites bilateral.   Musculoskeletal: There is prominence of metatarsal heads plantarly with atrophy of the fat pad and unable to palpate each of the metatarsal heads and this is where he gets his discomfort subjectively but there is no specific area pinpoint bony tenderness.  There is no overlying edema, erythema, increase in warmth.  Muscular strength 5/5 in all groups tested bilateral.  Gait: Unassisted,  Nonantalgic.       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  There is no definitive evidence of acute fracture or stress fracture.  Old fracture present the fifth metatarsal. -I debrided the hyperkeratotic lesions x2 without any complications or bleeding.  Discussed moisturizer to the area daily. -Dispensed metatarsal offloading pads.  Discussed shoe modifications and orthotics.  On the way out he states that with patent debridement of the calluses his pain resolved but he was having -Follow-up if symptoms do not improve the next couple weeks or sooner if any issues are to arise.  Trula Slade DPM

## 2018-01-26 DIAGNOSIS — D485 Neoplasm of uncertain behavior of skin: Secondary | ICD-10-CM | POA: Diagnosis not present

## 2018-01-26 DIAGNOSIS — L920 Granuloma annulare: Secondary | ICD-10-CM | POA: Diagnosis not present

## 2018-01-26 DIAGNOSIS — L821 Other seborrheic keratosis: Secondary | ICD-10-CM | POA: Diagnosis not present

## 2018-01-26 DIAGNOSIS — D0439 Carcinoma in situ of skin of other parts of face: Secondary | ICD-10-CM | POA: Diagnosis not present

## 2018-02-09 ENCOUNTER — Ambulatory Visit: Payer: Medicare HMO | Admitting: Physical Medicine & Rehabilitation

## 2018-02-09 ENCOUNTER — Encounter: Payer: Medicare HMO | Attending: Physical Medicine & Rehabilitation

## 2018-02-09 ENCOUNTER — Encounter: Payer: Self-pay | Admitting: Physical Medicine & Rehabilitation

## 2018-02-09 VITALS — BP 150/88 | HR 79 | Resp 14 | Ht 74.5 in | Wt 211.0 lb

## 2018-02-09 DIAGNOSIS — Z8249 Family history of ischemic heart disease and other diseases of the circulatory system: Secondary | ICD-10-CM | POA: Diagnosis not present

## 2018-02-09 DIAGNOSIS — J449 Chronic obstructive pulmonary disease, unspecified: Secondary | ICD-10-CM | POA: Insufficient documentation

## 2018-02-09 DIAGNOSIS — Z803 Family history of malignant neoplasm of breast: Secondary | ICD-10-CM | POA: Diagnosis not present

## 2018-02-09 DIAGNOSIS — M7061 Trochanteric bursitis, right hip: Secondary | ICD-10-CM | POA: Insufficient documentation

## 2018-02-09 DIAGNOSIS — Z8719 Personal history of other diseases of the digestive system: Secondary | ICD-10-CM | POA: Insufficient documentation

## 2018-02-09 DIAGNOSIS — Z809 Family history of malignant neoplasm, unspecified: Secondary | ICD-10-CM | POA: Insufficient documentation

## 2018-02-09 DIAGNOSIS — Z800 Family history of malignant neoplasm of digestive organs: Secondary | ICD-10-CM | POA: Diagnosis not present

## 2018-02-09 DIAGNOSIS — M7062 Trochanteric bursitis, left hip: Secondary | ICD-10-CM | POA: Diagnosis not present

## 2018-02-09 DIAGNOSIS — H269 Unspecified cataract: Secondary | ICD-10-CM | POA: Insufficient documentation

## 2018-02-09 DIAGNOSIS — M47816 Spondylosis without myelopathy or radiculopathy, lumbar region: Secondary | ICD-10-CM | POA: Diagnosis not present

## 2018-02-09 DIAGNOSIS — I10 Essential (primary) hypertension: Secondary | ICD-10-CM | POA: Diagnosis not present

## 2018-02-09 DIAGNOSIS — Z86010 Personal history of colonic polyps: Secondary | ICD-10-CM | POA: Insufficient documentation

## 2018-02-09 DIAGNOSIS — Z823 Family history of stroke: Secondary | ICD-10-CM | POA: Diagnosis not present

## 2018-02-09 DIAGNOSIS — Z87891 Personal history of nicotine dependence: Secondary | ICD-10-CM | POA: Diagnosis not present

## 2018-02-09 DIAGNOSIS — Z9889 Other specified postprocedural states: Secondary | ICD-10-CM | POA: Diagnosis not present

## 2018-02-09 NOTE — Progress Notes (Signed)
  PROCEDURE RECORD [facility]   Name: Devin [name] DOB:[birth year] MRN: [id number]  Date:02/09/2018  Physician: Alysia Penna, MD    Nurse/CMA: Daylin Gruszka, CMA  Allergies:  Allergies  Allergen Reactions  . Escitalopram Other (See Comments)    "made me feel drunk"    Consent Signed: Yes.    Is patient diabetic? No.  CBG today?   Pregnant: No. LMP: No LMP for male patient. (age [age])  Anticoagulants: no Anti-inflammatory: no Antibiotics: no  Procedure: bilateral medial branch block Position: Prone Start Time: 3:56pm  End Time:  4:05pm  Fluoro Time: 45s   RN/CMA Ernie Sagrero, CMA Teretha Chalupa, CMA    Time 3:40pm 4:14pm    BP 150/88 164/98    Pulse 79 84    Respirations 16 16    O2 Sat 91 94    S/S 6 6    Pain Level 4/10 0/10     D/C home with wife patient A & O X 3, D/C instructions reviewed, and sits independently.

## 2018-02-09 NOTE — Patient Instructions (Signed)

## 2018-02-09 NOTE — Progress Notes (Signed)
Bilateral Lumbar L3, L4  medial branch blocks and L 5 dorsal ramus injection under fluoroscopic guidance  Indication: Lumbar pain which is not relieved by medication management or other conservative care and interfering with self-care and mobility.  Informed consent was obtained after describing risks and benefits of the procedure with the patient, this includes bleeding, infection, paralysis and medication side effects.  The patient wishes to proceed and has given written consent.  The patient was placed in prone position.  The lumbar area was marked and prepped with Betadine.  One mL of 1% lidocaine was injected into each of 6 areas into the skin and subcutaneous tissue.  Then a 22-gauge 3.5" spinal needle was inserted targeting the junction of the left S1 superior articular process and sacral ala junction. Needle was advanced under fluoroscopic guidance.  Bone contact was made.  Isovue 200 was injected x 0.5 mL demonstrating no intravascular uptake.  Then a solution  of 2% MPF lidocaine was injected x 0.5 mL.  Then the left L5 superior articular process in transverse process junction was targeted.  Bone contact was made.  Isovue 200 was injected x 0.5 mL demonstrating no intravascular uptake. Then a solution containing  2% MPF lidocaine was injected x 0.5 mL.  Then the left L4 superior articular process in transverse process junction was targeted.  Bone contact was made.  Isovue 200 was injected x 0.5 mL demonstrating no intravascular uptake.  Then a solution containing2% MPF lidocaine was injected x 0.5 mL.  This same procedure was performed on the right side using the same needle, technique and injectate.  Patient tolerated procedure well.  Post procedure instructions were given.  

## 2018-02-11 DIAGNOSIS — D0439 Carcinoma in situ of skin of other parts of face: Secondary | ICD-10-CM | POA: Diagnosis not present

## 2018-03-02 DIAGNOSIS — H353211 Exudative age-related macular degeneration, right eye, with active choroidal neovascularization: Secondary | ICD-10-CM | POA: Diagnosis not present

## 2018-03-08 ENCOUNTER — Encounter: Payer: Self-pay | Admitting: Family Medicine

## 2018-03-08 ENCOUNTER — Ambulatory Visit (INDEPENDENT_AMBULATORY_CARE_PROVIDER_SITE_OTHER): Payer: Medicare HMO | Admitting: Family Medicine

## 2018-03-08 VITALS — BP 108/70 | HR 72 | Temp 97.8°F | Ht 74.5 in | Wt 214.1 lb

## 2018-03-08 DIAGNOSIS — M7061 Trochanteric bursitis, right hip: Secondary | ICD-10-CM | POA: Diagnosis not present

## 2018-03-08 DIAGNOSIS — N529 Male erectile dysfunction, unspecified: Secondary | ICD-10-CM | POA: Insufficient documentation

## 2018-03-08 DIAGNOSIS — M7062 Trochanteric bursitis, left hip: Secondary | ICD-10-CM

## 2018-03-08 MED ORDER — METHYLPREDNISOLONE ACETATE 40 MG/ML IJ SUSP
40.0000 mg | Freq: Once | INTRAMUSCULAR | Status: AC
  Administered 2018-03-08: 40 mg via INTRAMUSCULAR

## 2018-03-08 MED ORDER — SILDENAFIL CITRATE 100 MG PO TABS: 100.0000 mg | ORAL_TABLET | Freq: Every day | ORAL | 0 refills | Status: DC | PRN

## 2018-03-08 NOTE — Addendum Note (Signed)
Addended by: Sharon Seller B on: 03/08/2018 08:30 AM   Modules accepted: Orders

## 2018-03-08 NOTE — Progress Notes (Signed)
Chief Complaint  Patient presents with  . Follow-up   Pt 77 yo WM here for hip injections. Going to [place] tomorrow. Last injections helped for around 1 mo before wearing off. Painful today. Helps him work in garden.  Has been having issues with ED. Not sexually active often, but when he was last, did not go well. Would like some sort of medication if possible.  ROS-  GU: As noted in HPI  BP 108/70 (BP Location: Left Arm, Patient Position: Sitting, Cuff Size: Normal)   Pulse 72   Temp 97.8 F (36.6 C) (Oral)   Ht 6' 2.5" (1.892 m)   Wt 214 lb 2 oz (97.1 kg)   SpO2 95%   BMI 27.12 kg/m  Gen: Awake, alert MSK: +ttp over greater troch b/l, no deformity Lungs: No access msc use Neuro: Gait nml Psych: Age appropriate judgment and insight. Nml affect and mood  Procedure note: Greater trochanteric bursa injection x2 Verbal consent obtained. The area of interest was palpated and demarcated with an otoscope speculum. It was cleaned with an alcohol swab. Freeze spray was used. A 27 g needle was inserted at a perpendicular angle through the area of interested. The plunger was withdrawn to ensure our placement was not in a vessel. 2 mL of 1% lidocaine without epi and 40 mg of Depomedrol was injected. A bandaid was placed. The same process was repeated on the opposite side. The patient tolerated the procedure well.  There were no complications noted.   Greater trochanteric bursitis of both hips - Plan: PR DRAIN/INJECT LARGE JOINT/BURSA  Erectile dysfunction, unspecified erectile dysfunction type - Plan: sildenafil (VIAGRA) 100 MG tablet  Orders as above. Trial Viagra. Goodrx rec'd. F/u in 3 mo to assess how long shots are working. May need to consider PT to stretch IT band vs referral.  Pt voiced understanding and agreement ot the plan.  Crosby Oyster Debhora Titus 8:21 AM 03/08/18

## 2018-03-08 NOTE — Patient Instructions (Addendum)
GoodRx is an app that can help you find affordable options.  Have fun in [place]!  Let us know if you need anything.

## 2018-03-08 NOTE — Progress Notes (Signed)
Pre visit review using our clinic review tool, if applicable. No additional management support is needed unless otherwise documented below in the visit note. 

## 2018-04-21 ENCOUNTER — Ambulatory Visit: Payer: Medicare HMO | Admitting: Internal Medicine

## 2018-04-29 ENCOUNTER — Ambulatory Visit (INDEPENDENT_AMBULATORY_CARE_PROVIDER_SITE_OTHER): Payer: Medicare HMO | Admitting: Internal Medicine

## 2018-04-29 ENCOUNTER — Encounter: Payer: Self-pay | Admitting: Internal Medicine

## 2018-04-29 VITALS — BP 118/75 | HR 77 | Temp 98.1°F | Resp 16 | Ht 75.0 in | Wt 208.4 lb

## 2018-04-29 DIAGNOSIS — F329 Major depressive disorder, single episode, unspecified: Secondary | ICD-10-CM

## 2018-04-29 DIAGNOSIS — I10 Essential (primary) hypertension: Secondary | ICD-10-CM

## 2018-04-29 DIAGNOSIS — M150 Primary generalized (osteo)arthritis: Secondary | ICD-10-CM | POA: Diagnosis not present

## 2018-04-29 DIAGNOSIS — M159 Polyosteoarthritis, unspecified: Secondary | ICD-10-CM

## 2018-04-29 DIAGNOSIS — F419 Anxiety disorder, unspecified: Secondary | ICD-10-CM

## 2018-04-29 DIAGNOSIS — M25519 Pain in unspecified shoulder: Secondary | ICD-10-CM

## 2018-04-29 LAB — CBC
HCT: 44.4 % (ref 39.0–52.0)
Hemoglobin: 15 g/dL (ref 13.0–17.0)
MCHC: 33.8 g/dL (ref 30.0–36.0)
MCV: 93.2 fl (ref 78.0–100.0)
Platelets: 158 10*3/uL (ref 150.0–400.0)
RBC: 4.76 Mil/uL (ref 4.22–5.81)
RDW: 13.4 % (ref 11.5–15.5)
WBC: 6.6 10*3/uL (ref 4.0–10.5)

## 2018-04-29 LAB — BASIC METABOLIC PANEL
BUN: 23 mg/dL (ref 6–23)
CO2: 27 mEq/L (ref 19–32)
Calcium: 9.8 mg/dL (ref 8.4–10.5)
Chloride: 105 mEq/L (ref 96–112)
Creatinine, Ser: 0.89 mg/dL (ref 0.40–1.50)
GFR: 88.28 mL/min (ref >60.00)
Glucose, Bld: 110 mg/dL — ABNORMAL HIGH (ref 70–99)
POTASSIUM: 4.4 meq/L (ref 3.5–5.1)
Sodium: 140 mEq/L (ref 135–145)

## 2018-04-29 LAB — SEDIMENTATION RATE: Sed Rate: 2 mm/hr (ref 0–20)

## 2018-04-29 MED ORDER — CELECOXIB 100 MG PO CAPS: 100.0000 mg | ORAL_CAPSULE | Freq: Two times a day (BID) | ORAL | 1 refills | Status: DC | PRN

## 2018-04-29 NOTE — Progress Notes (Signed)
Subjective:    Patient ID: Devin [name], male    DOB: 09-08-1941, 77 y.o.   MRN: [id number]  DOS:  04/29/2018 Type of visit - description : rov Interval history: Continue with back pain which is chronic but now also is complaining of generalized aches mostly at the knees, hips, shoulders. Has noticed some right hand shakiness Anxiety depression: Good compliance with medication.  Symptoms are relatively well controlled at this point.   Review of Systems No fever chills Has lost some weight but on purpose, he is trying to eat healthier. Denies headaches, myalgia or visual disturbances  Past Medical History:  Diagnosis Date  . Cataract   . COPD (chronic obstructive pulmonary disease) ([place])   . Diverticulosis    pt states he had diverticuli in his bladder also.  . H/O [facility] spotted fever 01/27/2014  . Hematuria    over 5 years ago  . Hernia of abdominal cavity   . History of rectal polyps   . Hyperplastic colon polyp   . Hypertension   . Macular degeneration   . Rectal cancer ([place]) 1994  . Shoulder pain, left    per pt, he can lay on the left side!  . Tubular adenoma    01/05/2013    Past Surgical History:  Procedure Laterality Date  . APPENDECTOMY    . COLON SURGERY     rectal cancer ~ 1999  . EYE SURGERY     cataract sx  . HERNIA REPAIR    . IR KYPHO LUMBAR INC FX REDUCE BONE BX UNI/BIL CANNULATION INC/IMAGING  06/12/2017  . IR RADIOLOGIST EVAL & MGMT  08/06/2017  . mastoid gland removal     left ear  . PROSTATE SURGERY  05/2015  . TONSILLECTOMY      Social History   Socioeconomic History  . Marital status: Married    Spouse name: Denice Paradise  . Number of children: 2  . Years of education: Not on file  . Highest education level: Not on file  Occupational History  . Occupation: RetiredMultimedia programmer  . Financial resource strain: Not on file  . Food insecurity:    Worry: Not on file    Inability: Not on file  . Transportation needs:    Medical:  Not on file    Non-medical: Not on file  Tobacco Use  . Smoking status: Former Smoker    Last attempt to quit: 04/19/1989    Years since quitting: 29.0  . Smokeless tobacco: Never Used  Substance and Sexual Activity  . Alcohol use: Yes    Alcohol/week: 8.4 oz    Types: 14 Glasses of wine per week    Comment: most days  , amounts varies , sometimes > 2 servings   . Drug use: No  . Sexual activity: Not on file  Lifestyle  . Physical activity:    Days per week: Not on file    Minutes per session: Not on file  . Stress: Not on file  Relationships  . Social connections:    Talks on phone: Not on file    Gets together: Not on file    Attends religious service: Not on file    Active member of club or organization: Not on file    Attends meetings of clubs or organizations: Not on file    Relationship status: Not on file  . Intimate partner violence:    Fear of current or ex partner: Not on file  Emotionally abused: Not on file    Physically abused: Not on file    Forced sexual activity: Not on file  Other Topics Concern  . Not on file  Social History Narrative   Household-- pt, wife (she has a post polio syndrome)   Lost oldest son ~ 2011   Moved from [place] ~ 2011   Youngest son lives in [place] reason why he moved here        Allergies as of 04/29/2018      Reactions   Escitalopram Other (See Comments)   "made me feel drunk"      Medication List        Accurate as of 04/29/18 11:59 PM. Always use your most recent med list.          50+ ADULT EYE HEALTH PO Take 1 capsule by mouth daily.   acetaminophen 500 MG tablet Commonly known as:  TYLENOL Take 1,000 mg by mouth every 6 (six) hours as needed for mild pain.   alendronate 70 MG tablet Commonly known as:  FOSAMAX Take 1 tablet (70 mg total) by mouth once a week. Take with a full glass of water on an empty stomach.   calcium carbonate 600 MG Tabs tablet Commonly known as:  OS-CAL Take 1 tablet (600 mg  total) by mouth daily with breakfast. + vitamin D   carvedilol 6.25 MG tablet Commonly known as:  COREG Take 1 tablet (6.25 mg total) by mouth 2 (two) times daily with a meal.   celecoxib 100 MG capsule Commonly known as:  CELEBREX Take 1 capsule (100 mg total) by mouth 2 (two) times daily as needed.   clonazePAM 1 MG tablet Commonly known as:  KLONOPIN Take 0.5 mg by mouth 2 (two) times daily as needed for anxiety.   CURCUMAX PRO PO Take 1,650 mg by mouth daily.   NONFORMULARY OR COMPOUNDED ITEM   nortriptyline 50 MG capsule Commonly known as:  PAMELOR Take 1 capsule (50 mg total) by mouth at bedtime.   omeprazole 20 MG capsule Commonly known as:  PRILOSEC Take 1 capsule (20 mg total) by mouth daily.   sildenafil 100 MG tablet Commonly known as:  VIAGRA Take 1 tablet (100 mg total) by mouth daily as needed for erectile dysfunction.          Objective:   Physical Exam BP 118/75 (BP Location: Left Arm, Patient Position: Sitting, Cuff Size: Small)   Pulse 77   Temp 98.1 F (36.7 C) (Oral)   Resp 16   Ht 6\' 3"  (1.905 m)   Wt 208 lb 6.4 oz (94.5 kg)   SpO2 95%   BMI 26.05 kg/m  General:   Well developed, NAD, see BMI.  HEENT:  Normocephalic . Face symmetric, atraumatic Temples: No TTP, normal TA pulses Lungs:  CTA B Normal respiratory effort, no intercostal retractions, no accessory muscle use. Heart: RRR,  no murmur.  No pretibial edema bilaterally  Skin: Not pale. Not jaundice MSK: No synovitis on exam of the hands and wrists.  Knees without synovitis either Neurologic:  alert & oriented X3.  Speech normal, gait appropriate for age and unassisted Psych--  Cognition and judgment appear intact.  Cooperative with normal attention span and concentration.  Behavior appropriate. No anxious or depressed appearing.      Assessment & Plan:    Assessment HTN Depression-insomnia, lost a son ~2011 GERD sx onset ~2016 MSK: --DJD- hip injection 2017  --L2  vertebral fracture d/t a  fall , kyphoplasty 05-2017 --T score 06-2017 -0.2, was rx ca and vit D.  Started  Fosamax 09-2017 BPH --s/p surgery x2 2016 , Dr Hazle Nordmann H/o etoh, mild  Rectal cancer-- s/p surgery ~ 1999, no chemo-XRT FH CAD, F MI age 5 Former light smoker , No symptoms  PLAN  HTN: Seems controlled on carvedilol.  Check a BMP and CBC Depression insomnia: Currently on clonazepam, Pamelor, symptoms relatively well controlled.  He had a OV w/  our counselor but evidently the patient did not enjoy the visit and does not plan to go back. Chronic back pain: Has seen Dr. Letta Pate, has gotten few local injection, they helped temporarily, does not plan to go back because they are very costly and cost / benefit is not in his favor. DJD: He also reports generalized pain at the knees, hips, shoulders.  No fever chills, no visual disturbances.  Will check a sed rate for completeness.  For pain control recommend to continue Tylenol, topical ibuprofen and start Celebrex.  GI precautions discussed.  Will come back in 2 months for reassessment of pain control. RTC 2 months   Today, I spent more than 25   min with the patient: >50% of the time counseling regards depression, insomnia, listening therapy provided .  Also he reported more widespread aches in addition to his chronic back pain, listening therapy provided.

## 2018-04-29 NOTE — Patient Instructions (Signed)
GO TO THE LAB : Get the blood work     GO TO THE FRONT DESK Schedule your next appointment for a checkup in 2 months  For pain: --Continue Tylenol twice a day --Continue topical ibuprofen --Please try Celebrex 100 mg: 1 tablet twice a day as needed. Always take it with food because may cause gastritis and ulcers.  If you notice nausea, stomach pain, change in the color of stools --->  Stop the medicine and let us know

## 2018-04-30 NOTE — Assessment & Plan Note (Signed)
HTN: Seems controlled on carvedilol.  Check a BMP and CBC Depression insomnia: Currently on clonazepam, Pamelor, symptoms relatively well controlled.  He had a OV w/  our counselor but evidently the patient did not enjoy the visit and does not plan to go back. Chronic back pain: Has seen Dr. Letta Pate, has gotten few local injection, they helped temporarily, does not plan to go back because they are very costly and cost / benefit is not in his favor. DJD: He also reports generalized pain at the knees, hips, shoulders.  No fever chills, no visual disturbances.  Will check a sed rate for completeness.  For pain control recommend to continue Tylenol, topical ibuprofen and start Celebrex.  GI precautions discussed.  Will come back in 2 months for reassessment of pain control. RTC 2 months

## 2018-05-05 ENCOUNTER — Ambulatory Visit: Payer: Medicare HMO | Admitting: Psychology

## 2018-05-07 DIAGNOSIS — H353211 Exudative age-related macular degeneration, right eye, with active choroidal neovascularization: Secondary | ICD-10-CM | POA: Diagnosis not present

## 2018-05-11 ENCOUNTER — Ambulatory Visit: Payer: Self-pay | Admitting: Physical Medicine & Rehabilitation

## 2018-05-14 DIAGNOSIS — N138 Other obstructive and reflux uropathy: Secondary | ICD-10-CM | POA: Diagnosis not present

## 2018-05-14 DIAGNOSIS — N401 Enlarged prostate with lower urinary tract symptoms: Secondary | ICD-10-CM | POA: Diagnosis not present

## 2018-06-09 ENCOUNTER — Encounter: Payer: Self-pay | Admitting: Internal Medicine

## 2018-06-09 ENCOUNTER — Ambulatory Visit (INDEPENDENT_AMBULATORY_CARE_PROVIDER_SITE_OTHER): Payer: Medicare HMO | Admitting: Internal Medicine

## 2018-06-09 VITALS — BP 118/82 | HR 85 | Temp 97.7°F | Resp 16 | Ht 75.0 in | Wt 211.2 lb

## 2018-06-09 DIAGNOSIS — F419 Anxiety disorder, unspecified: Secondary | ICD-10-CM

## 2018-06-09 DIAGNOSIS — F329 Major depressive disorder, single episode, unspecified: Secondary | ICD-10-CM | POA: Diagnosis not present

## 2018-06-09 MED ORDER — CLONAZEPAM 1 MG PO TABS: 0.5000 mg | ORAL_TABLET | Freq: Two times a day (BID) | ORAL | 0 refills | Status: DC | PRN

## 2018-06-09 MED ORDER — CARVEDILOL 6.25 MG PO TABS: 6.2500 mg | ORAL_TABLET | Freq: Two times a day (BID) | ORAL | 2 refills | Status: DC

## 2018-06-09 MED ORDER — CELECOXIB 100 MG PO CAPS: 100.0000 mg | ORAL_CAPSULE | Freq: Two times a day (BID) | ORAL | 2 refills | Status: DC | PRN

## 2018-06-09 MED ORDER — CLOTRIMAZOLE-BETAMETHASONE 1-0.05 % EX CREA: 1.0000 "application " | TOPICAL_CREAM | Freq: Two times a day (BID) | CUTANEOUS | 2 refills | Status: DC

## 2018-06-09 MED ORDER — ALENDRONATE SODIUM 70 MG PO TABS: 70.0000 mg | ORAL_TABLET | ORAL | 2 refills | Status: DC
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
## 2018-06-09 NOTE — Patient Instructions (Signed)
  GO TO THE FRONT DESK Schedule your next appointment for a  Check up in 3 months  

## 2018-06-10 NOTE — Assessment & Plan Note (Signed)
Multiple refills Depression, anxiety, insomnia: Extensive discussion about his family situation, encouraged to see a counselor , recommend to call any time if needed RTC 3 months

## 2018-06-14 ENCOUNTER — Ambulatory Visit: Payer: Self-pay | Admitting: Internal Medicine

## 2018-06-24 ENCOUNTER — Ambulatory Visit: Payer: Self-pay | Admitting: Internal Medicine

## 2018-07-12 ENCOUNTER — Ambulatory Visit (INDEPENDENT_AMBULATORY_CARE_PROVIDER_SITE_OTHER): Payer: Medicare HMO | Admitting: Internal Medicine

## 2018-07-12 ENCOUNTER — Encounter: Payer: Self-pay | Admitting: Internal Medicine

## 2018-07-12 VITALS — BP 118/70 | HR 70 | Temp 98.6°F | Resp 16 | Ht 75.0 in | Wt 212.1 lb

## 2018-07-12 DIAGNOSIS — Z23 Encounter for immunization: Secondary | ICD-10-CM | POA: Diagnosis not present

## 2018-07-12 DIAGNOSIS — F329 Major depressive disorder, single episode, unspecified: Secondary | ICD-10-CM | POA: Diagnosis not present

## 2018-07-12 DIAGNOSIS — Z79899 Other long term (current) drug therapy: Secondary | ICD-10-CM

## 2018-07-12 DIAGNOSIS — M159 Polyosteoarthritis, unspecified: Secondary | ICD-10-CM

## 2018-07-12 DIAGNOSIS — F32A Depression, unspecified: Secondary | ICD-10-CM

## 2018-07-12 DIAGNOSIS — F419 Anxiety disorder, unspecified: Secondary | ICD-10-CM

## 2018-07-12 DIAGNOSIS — M150 Primary generalized (osteo)arthritis: Secondary | ICD-10-CM | POA: Diagnosis not present

## 2018-07-12 DIAGNOSIS — H353211 Exudative age-related macular degeneration, right eye, with active choroidal neovascularization: Secondary | ICD-10-CM | POA: Diagnosis not present

## 2018-07-12 NOTE — Progress Notes (Signed)
Subjective:    Patient ID: Devin [name], male    DOB: 10/17/1940, 77 y.o.   MRN: [id number]  DOS:  07/12/2018 Type of visit - description : Follow-up Interval history: Since the last office visit, he had counseling, both couples and personal. Emotionally doing better. Sleeping better. MSK: Continue with pain as before, not completely well controlled,  more intense @ shoulders and at night  Wt Readings from Last 3 Encounters:  07/12/18 212 lb 2 oz (96.2 kg)  06/09/18 211 lb 3.2 oz (95.8 kg)  04/29/18 208 lb 6.4 oz (94.5 kg)     Review of Systems   Denies fever, chills. No weight loss or headaches   Past Medical History:  Diagnosis Date  . Cataract   . COPD (chronic obstructive pulmonary disease) ([place])   . Diverticulosis    pt states he had diverticuli in his bladder also.  . H/O [facility] spotted fever 01/27/2014  . Hematuria    over 5 years ago  . Hernia of abdominal cavity   . History of rectal polyps   . Hyperplastic colon polyp   . Hypertension   . Macular degeneration   . Rectal cancer ([place]) 1994  . Shoulder pain, left    per pt, he can lay on the left side!  . Tubular adenoma    01/05/2013    Past Surgical History:  Procedure Laterality Date  . APPENDECTOMY    . COLON SURGERY     rectal cancer ~ 1999  . EYE SURGERY     cataract sx  . HERNIA REPAIR    . IR KYPHO LUMBAR INC FX REDUCE BONE BX UNI/BIL CANNULATION INC/IMAGING  06/12/2017  . IR RADIOLOGIST EVAL & MGMT  08/06/2017  . mastoid gland removal     left ear  . PROSTATE SURGERY  05/2015  . TONSILLECTOMY      Social History   Socioeconomic History  . Marital status: Married    Spouse name: Denice Paradise  . Number of children: 2  . Years of education: Not on file  . Highest education level: Not on file  Occupational History  . Occupation: RetiredMultimedia programmer  . Financial resource strain: Not on file  . Food insecurity:    Worry: Not on file    Inability: Not on file  .  Transportation needs:    Medical: Not on file    Non-medical: Not on file  Tobacco Use  . Smoking status: Former Smoker    Last attempt to quit: 04/19/1989    Years since quitting: 29.2  . Smokeless tobacco: Never Used  Substance and Sexual Activity  . Alcohol use: Yes    Alcohol/week: 14.0 standard drinks    Types: 14 Glasses of wine per week    Comment: most days  , amounts varies , sometimes > 2 servings   . Drug use: No  . Sexual activity: Not on file  Lifestyle  . Physical activity:    Days per week: Not on file    Minutes per session: Not on file  . Stress: Not on file  Relationships  . Social connections:    Talks on phone: Not on file    Gets together: Not on file    Attends religious service: Not on file    Active member of club or organization: Not on file    Attends meetings of clubs or organizations: Not on file    Relationship status: Not on file  .  Intimate partner violence:    Fear of current or ex partner: Not on file    Emotionally abused: Not on file    Physically abused: Not on file    Forced sexual activity: Not on file  Other Topics Concern  . Not on file  Social History Narrative   Household-- pt, wife Denice Paradise)   [place] from [place] ~ 2010-01-05   Youngest son lives in [place] reason why he moved here        For Denice Paradise, this is her second marriage.   She lost her first husband.   Aaron Edelman adopted their son  [name] who died in 01-05-10, he was an alcoholic.   They have together  one biological son.  He lives in [place].             Allergies as of 07/12/2018      Reactions   Escitalopram Other (See Comments)   "made me feel drunk"      Medication List        Accurate as of 07/12/18  1:17 PM. Always use your most recent med list.          acetaminophen 500 MG tablet Commonly known as:  TYLENOL Take 1,000 mg by mouth every 6 (six) hours as needed for mild pain.   alendronate 70 MG tablet Commonly known as:  FOSAMAX Take 1 tablet (70 mg total) by  mouth once a week. Take with a full glass of water on an empty stomach.   carvedilol 6.25 MG tablet Commonly known as:  COREG Take 1 tablet (6.25 mg total) by mouth 2 (two) times daily with a meal.   celecoxib 100 MG capsule Commonly known as:  CELEBREX Take 1 capsule (100 mg total) by mouth 2 (two) times daily as needed.   clonazePAM 1 MG tablet Commonly known as:  KLONOPIN Take 0.5 tablets (0.5 mg total) by mouth 2 (two) times daily as needed for anxiety.   clotrimazole-betamethasone cream Commonly known as:  LOTRISONE Apply 1 application topically 2 (two) times daily.   CURCUMAX PRO PO Take 1,650 mg by mouth daily.   NONFORMULARY OR COMPOUNDED ITEM   nortriptyline 50 MG capsule Commonly known as:  PAMELOR Take 1 capsule (50 mg total) by mouth at bedtime.   omeprazole 20 MG capsule Commonly known as:  PRILOSEC Take 1 capsule (20 mg total) by mouth daily.          Objective:   Physical Exam BP 118/70 (BP Location: Left Arm, Patient Position: Sitting, Cuff Size: Small)   Pulse 70   Temp 98.6 F (37 C) (Oral)   Resp 16   Ht 6\' 3"  (1.905 m)   Wt 212 lb 2 oz (96.2 kg)   SpO2 91%   BMI 26.51 kg/m  General:   Well developed, NAD, see BMI.  HEENT:  Normocephalic . Face symmetric, atraumatic Skin: Not pale. Not jaundice Neurologic:  alert & oriented X3.  Speech normal, gait appropriate for age and unassisted Psych--  Cognition and judgment appear intact.  Cooperative with normal attention span and concentration.  Behavior appropriate. No anxious or depressed appearing.      Assessment & Plan:   Assessment HTN Depression-insomnia, lost a son 01/05/10 GERD sx onset 01-06-2015 MSK: --DJD- hip injection 01/06/16  --L2 vertebral fracture d/t a fall , kyphoplasty 05-2017 --T score 06-2017 -0.2, was rx ca and vit D.  Started  Fosamax 09-2017 BPH --s/p surgery x2 01/06/15 , Dr Hazle Nordmann H/o etoh,  mild  Rectal cancer-- s/p surgery ~ 1999, no chemo-XRT FH CAD, F MI age  13 Former light smoker , No symptoms  PLAN  Depression, anxiety, insomnia. S/p  personal and couples counseling, feels better, recommend to continue with counseling.   Also at some point the wife raised the question of excessive EtOH, that is not the case, he is here w/ his wife, he reports drinks some nights in moderation. Current medications: Clonazepam, nortriptyline.  Continue with the same, UDS and contract today. DJD: Pain is about the same, still having problems with shoulder pain at night.  Sed rate was normal few weeks ago. Plan: change  Celebrex 100 mg brom BID to 2 tablets w/ dinner in an attempt to help pain at night.  Tylenol OTC 2-3 times during the day.  Flu shot today RTC 09-2018 CPX

## 2018-07-12 NOTE — Assessment & Plan Note (Signed)
Depression, anxiety, insomnia. S/p  personal and couples counseling, feels better, recommend to continue with counseling.   Also at some point the wife raised the question of excessive EtOH, that is not the case, he is here w/ his wife, he reports drinks some nights in moderation. Current medications: Clonazepam, nortriptyline.  Continue with the same, UDS and contract today. DJD: Pain is about the same, still having problems with shoulder pain at night.  Sed rate was normal few weeks ago. Plan: change  Celebrex 100 mg brom BID to 2 tablets w/ dinner in an attempt to help pain at night.  Tylenol OTC 2-3 times during the day.  Flu shot today RTC 09-2018 CPX

## 2018-07-12 NOTE — Progress Notes (Signed)
Pre visit review using our clinic review tool, if applicable. No additional management support is needed unless otherwise documented below in the visit note. 

## 2018-07-12 NOTE — Patient Instructions (Addendum)
GO TO THE LAB : Provide a urine sample   GO TO THE FRONT DESK Schedule your next appointment for a   physical exam by 09-2018  Pain management: Tylenol  500 mg OTC: ok to take up to  2 tabs a day every 8 hours as needed for pain Celebrex 100 mg: Take 2 tablets with dinner as needed Always take it with food because may cause gastritis and ulcers.  If you notice nausea, stomach pain, change in the color of stools --->  Stop the medicine and let us know

## 2018-07-13 ENCOUNTER — Other Ambulatory Visit: Payer: Medicare HMO

## 2018-07-17 LAB — PAIN MGMT, PROFILE 8 W/CONF, U
6 ACETYLMORPHINE: NEGATIVE ng/mL (ref <10)
ALPHAHYDROXYALPRAZOLAM: NEGATIVE ng/mL (ref <25)
AMINOCLONAZEPAM: 130 ng/mL — AB (ref <25)
Alcohol Metabolites: POSITIVE ng/mL — AB (ref <500)
Alphahydroxymidazolam: NEGATIVE ng/mL (ref <50)
Alphahydroxytriazolam: NEGATIVE ng/mL (ref <50)
Amphetamines: NEGATIVE ng/mL (ref <500)
Benzodiazepines: POSITIVE ng/mL — AB (ref <100)
Buprenorphine, Urine: NEGATIVE ng/mL (ref <5)
COCAINE METABOLITE: NEGATIVE ng/mL (ref <150)
CREATININE: 154.1 mg/dL
Ethyl Glucuronide (ETG): 44167 ng/mL — ABNORMAL HIGH (ref <500)
Ethyl Sulfate (ETS): 8016 ng/mL — ABNORMAL HIGH (ref <100)
Hydroxyethylflurazepam: NEGATIVE ng/mL (ref <50)
LORAZEPAM: NEGATIVE ng/mL (ref <50)
MDMA: NEGATIVE ng/mL (ref <500)
Marijuana Metabolite: NEGATIVE ng/mL (ref <20)
Nordiazepam: NEGATIVE ng/mL (ref <50)
OPIATES: NEGATIVE ng/mL (ref <100)
Oxazepam: NEGATIVE ng/mL (ref <50)
Oxidant: NEGATIVE ug/mL (ref <200)
Oxycodone: NEGATIVE ng/mL (ref <100)
PH: 6.8 (ref 4.5–9.0)
TEMAZEPAM: NEGATIVE ng/mL (ref <50)

## 2018-07-31 ENCOUNTER — Other Ambulatory Visit: Payer: Self-pay | Admitting: Family Medicine

## 2018-07-31 DIAGNOSIS — G4700 Insomnia, unspecified: Secondary | ICD-10-CM

## 2018-08-30 ENCOUNTER — Ambulatory Visit (INDEPENDENT_AMBULATORY_CARE_PROVIDER_SITE_OTHER): Payer: Medicare HMO | Admitting: Internal Medicine

## 2018-08-30 ENCOUNTER — Encounter: Payer: Self-pay | Admitting: Internal Medicine

## 2018-08-30 VITALS — BP 126/70 | HR 68 | Temp 98.1°F | Resp 16 | Ht 75.0 in | Wt 213.0 lb

## 2018-08-30 DIAGNOSIS — F329 Major depressive disorder, single episode, unspecified: Secondary | ICD-10-CM

## 2018-08-30 DIAGNOSIS — I639 Cerebral infarction, unspecified: Secondary | ICD-10-CM | POA: Diagnosis not present

## 2018-08-30 DIAGNOSIS — G459 Transient cerebral ischemic attack, unspecified: Secondary | ICD-10-CM

## 2018-08-30 DIAGNOSIS — M25511 Pain in right shoulder: Secondary | ICD-10-CM | POA: Diagnosis not present

## 2018-08-30 DIAGNOSIS — M25512 Pain in left shoulder: Secondary | ICD-10-CM | POA: Diagnosis not present

## 2018-08-30 DIAGNOSIS — F419 Anxiety disorder, unspecified: Secondary | ICD-10-CM

## 2018-08-30 LAB — CBC WITH DIFFERENTIAL/PLATELET
BASOS ABS: 0 10*3/uL (ref 0.0–0.1)
Basophils Relative: 0.3 % (ref 0.0–3.0)
EOS PCT: 4.9 % (ref 0.0–5.0)
Eosinophils Absolute: 0.3 10*3/uL (ref 0.0–0.7)
HCT: 44 % (ref 39.0–52.0)
Hemoglobin: 14.9 g/dL (ref 13.0–17.0)
LYMPHS ABS: 2 10*3/uL (ref 0.7–4.0)
Lymphocytes Relative: 38.4 % (ref 12.0–46.0)
MCHC: 33.9 g/dL (ref 30.0–36.0)
MCV: 91.6 fl (ref 78.0–100.0)
MONOS PCT: 9.1 % (ref 3.0–12.0)
Monocytes Absolute: 0.5 10*3/uL (ref 0.1–1.0)
NEUTROS ABS: 2.5 10*3/uL (ref 1.4–7.7)
NEUTROS PCT: 47.3 % (ref 43.0–77.0)
PLATELETS: 158 10*3/uL (ref 150.0–400.0)
RBC: 4.8 Mil/uL (ref 4.22–5.81)
RDW: 13.6 % (ref 11.5–15.5)
WBC: 5.2 10*3/uL (ref 4.0–10.5)

## 2018-08-30 LAB — BASIC METABOLIC PANEL
BUN: 17 mg/dL (ref 6–23)
CALCIUM: 9.7 mg/dL (ref 8.4–10.5)
CO2: 30 mEq/L (ref 19–32)
Chloride: 103 mEq/L (ref 96–112)
Creatinine, Ser: 0.79 mg/dL (ref 0.40–1.50)
GFR: 101.21 mL/min (ref >60.00)
Glucose, Bld: 102 mg/dL — ABNORMAL HIGH (ref 70–99)
Potassium: 4.5 mEq/L (ref 3.5–5.1)
SODIUM: 140 meq/L (ref 135–145)

## 2018-08-30 LAB — HEMOGLOBIN A1C: Hgb A1c MFr Bld: 5.6 % (ref 4.6–6.5)

## 2018-08-30 NOTE — Progress Notes (Signed)
Pre visit review using our clinic review tool, if applicable. No additional management support is needed unless otherwise documented below in the visit note. 

## 2018-08-30 NOTE — Progress Notes (Signed)
Subjective:    Patient ID: Devin [name], male    DOB: [birth year], 77 y.o.   MRN: [id number]  DOS:  08/30/2018 Type of visit - description : Follow-up Depression anxiety insomnia: Overall feels better, had additional counseling since last OV DJD: Still an issue, shoulder pain bilaterally, taking Celebrex at night with some help. EtOH: Reports moderation.  1 or 2 drinks at night. Stroke?  At the end of the visit, the patient reports he thinks he had a stroke 6 weeks ago.  Had severe dizziness, had to sit down, "I think I probably passed out". At the time, his a speech was unclear for 1 or 2 hours. Since then he is asymptomatic except in the mornings from time to time has hard time finding words, that self resolve quickly after he wakes up.   Review of Systems Denies chest pain, palpitations.  No visual disturbances No headaches No facial or motor weaknesses.  Past Medical History:  Diagnosis Date  . Cataract   . COPD (chronic obstructive pulmonary disease) ([place])   . Diverticulosis    pt states he had diverticuli in his bladder also.  . H/O [facility] spotted fever 01/27/2014  . Hematuria    over 5 years ago  . Hernia of abdominal cavity   . History of rectal polyps   . Hyperplastic colon polyp   . Hypertension   . Macular degeneration   . Rectal cancer ([place]) 1994  . Shoulder pain, left    per pt, he can lay on the left side!  . Tubular adenoma    01/05/2013    Past Surgical History:  Procedure Laterality Date  . APPENDECTOMY    . COLON SURGERY     rectal cancer ~ 1999  . EYE SURGERY     cataract sx  . HERNIA REPAIR    . IR KYPHO LUMBAR INC FX REDUCE BONE BX UNI/BIL CANNULATION INC/IMAGING  06/12/2017  . IR RADIOLOGIST EVAL & MGMT  08/06/2017  . mastoid gland removal     left ear  . PROSTATE SURGERY  05/2015  . TONSILLECTOMY      Social History   Socioeconomic History  . Marital status: Married    Spouse name: Denice Paradise  . Number of children: 2  . Years of  education: Not on file  . Highest education level: Not on file  Occupational History  . Occupation: RetiredMultimedia programmer  . Financial resource strain: Not on file  . Food insecurity:    Worry: Not on file    Inability: Not on file  . Transportation needs:    Medical: Not on file    Non-medical: Not on file  Tobacco Use  . Smoking status: Former Smoker    Last attempt to quit: 04/19/1989    Years since quitting: 29.3  . Smokeless tobacco: Never Used  Substance and Sexual Activity  . Alcohol use: Yes    Alcohol/week: 14.0 standard drinks    Types: 14 Glasses of wine per week    Comment: most days  , amounts varies , sometimes > 2 servings   . Drug use: No  . Sexual activity: Not on file  Lifestyle  . Physical activity:    Days per week: Not on file    Minutes per session: Not on file  . Stress: Not on file  Relationships  . Social connections:    Talks on phone: Not on file    Gets together: Not on file  Attends religious service: Not on file    Active member of club or organization: Not on file    Attends meetings of clubs or organizations: Not on file    Relationship status: Not on file  . Intimate partner violence:    Fear of current or ex partner: Not on file    Emotionally abused: Not on file    Physically abused: Not on file    Forced sexual activity: Not on file  Other Topics Concern  . Not on file  Social History Narrative   Household-- pt, wife Denice Paradise)   [place] from [place] ~ 01/01/10   Youngest son lives in [place] reason why he moved here        For Denice Paradise, this is her second marriage.   She lost her first husband.   Aaron Edelman adopted their son  [name] who died in 01/01/2010, he was an alcoholic.   They have together  one biological son.  He lives in [place].             Allergies as of 08/30/2018      Reactions   Escitalopram Other (See Comments)   "made me feel drunk"      Medication List        Accurate as of 08/30/18  9:14 AM. Always use  your most recent med list.          acetaminophen 500 MG tablet Commonly known as:  TYLENOL Take 1,000 mg by mouth every 6 (six) hours as needed for mild pain.   alendronate 70 MG tablet Commonly known as:  FOSAMAX Take 1 tablet (70 mg total) by mouth once a week. Take with a full glass of water on an empty stomach.   carvedilol 6.25 MG tablet Commonly known as:  COREG Take 1 tablet (6.25 mg total) by mouth 2 (two) times daily with a meal.   celecoxib 100 MG capsule Commonly known as:  CELEBREX Take 2 capsules (200 mg total) by mouth daily with supper.   clonazePAM 1 MG tablet Commonly known as:  KLONOPIN Take 0.5 tablets (0.5 mg total) by mouth 2 (two) times daily as needed for anxiety.   clotrimazole-betamethasone cream Commonly known as:  LOTRISONE Apply 1 application topically 2 (two) times daily.   CURCUMAX PRO PO Take 1,650 mg by mouth daily.   NONFORMULARY OR COMPOUNDED ITEM   nortriptyline 50 MG capsule Commonly known as:  PAMELOR TAKE 1 CAPSULE AT BEDTIME   omeprazole 20 MG capsule Commonly known as:  PRILOSEC Take 1 capsule (20 mg total) by mouth daily.           Objective:   Physical Exam BP 126/70 (BP Location: Left Arm, Patient Position: Sitting, Cuff Size: Small)   Pulse 68   Temp 98.1 F (36.7 C) (Oral)   Resp 16   Ht 6\' 3"  (1.905 m)   Wt 213 lb (96.6 kg)   SpO2 98%   BMI 26.62 kg/m   General:   Well developed, NAD, BMI noted. HEENT:  Normocephalic . Face symmetric, atraumatic Neck: Normal carotid pulse Lungs:  CTA B Normal respiratory effort, no intercostal retractions, no accessory muscle use. Heart: RRR,  no murmur.  No pretibial edema bilaterally  Skin: Not pale. Not jaundice Neurologic:  alert & oriented X3.  Speech normal, gait appropriate for age and unassisted. Face symmetric, speech seems normal today, DTRs: Symmetric, decreased ankle jerks bilaterally. Psych--  Cognition and judgment appear intact.  Cooperative with  normal  attention span and concentration.  Behavior appropriate. No anxious or depressed appearing.      Assessment & Plan:    Assessment HTN Depression-insomnia, lost a son ~2011 GERD sx onset ~2016 MSK: --DJD- hip injection 2017  --L2 vertebral fracture d/t a fall , kyphoplasty 05-2017 --T score 06-2017 -0.2, was rx ca and vit D.  Started  Fosamax 09-2017 BPH --s/p surgery x2 2016 , Dr Hazle Nordmann H/o etoh, mild  Rectal cancer-- s/p surgery ~ 1999, no chemo-XRT FH CAD, F MI age 65 Former light smoker , No symptoms  PLAN  TIA-Stroke?  See HPI, symptoms from 6 weeks ago are suggestive of TIA/stroke; neuro exam today is nonfocal. 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 Plan: BMP, CBC, A1c.  Echo, brain MRI, carotid ultrasound.  Start aspirin which results.  ER if symptoms resurface Tremor, right hand: Started a while back per pt, no tremor noted on today's exam. Anxiety, depression, insomnia.  Stable/improved, had additional counseling since last OV DJD, shoulder pain: Still an issue, Celebrex 2 tablets at bedtime helps.  No apparent GI side effects.  Recommend to reduce to 1 tablet at bedtime, refer to sports medicine for possible injection. RTC  2 months

## 2018-08-30 NOTE — Patient Instructions (Addendum)
GO TO THE LAB : Get the blood work     GO TO THE FRONT DESK Schedule your next appointment for a     checkup in 2 months  If you have more stroke symptoms: Call 911, go to the ER  We will schedule a echocardiogram, brain MRI and carotid ultrasound.  We will refer you to sports medicine

## 2018-08-30 NOTE — Assessment & Plan Note (Addendum)
TIA-Stroke?  See HPI, symptoms from 6 weeks ago are suggestive of TIA/stroke; neuro exam today is nonfocal. 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 Plan: BMP, CBC, A1c.  Echo, brain MRI, carotid ultrasound.  Start aspirin which results.  ER if symptoms resurface Tremor, right hand: Started a while back per pt, no tremor noted on today's exam. Anxiety, depression, insomnia.  Stable/improved, had additional counseling since last OV DJD, shoulder pain: Still an issue, Celebrex 2 tablets at bedtime helps.  No apparent GI side effects.  Recommend to reduce to 1 tablet at bedtime, refer to sports medicine for possible injection. RTC  2 months

## 2018-08-31 ENCOUNTER — Encounter: Payer: Self-pay | Admitting: Family Medicine

## 2018-08-31 ENCOUNTER — Ambulatory Visit: Payer: Medicare HMO | Admitting: Family Medicine

## 2018-08-31 ENCOUNTER — Ambulatory Visit: Payer: Self-pay

## 2018-08-31 VITALS — BP 138/91 | HR 76 | Ht 75.0 in | Wt 208.0 lb

## 2018-08-31 DIAGNOSIS — M25512 Pain in left shoulder: Secondary | ICD-10-CM | POA: Diagnosis not present

## 2018-08-31 DIAGNOSIS — G8929 Other chronic pain: Secondary | ICD-10-CM | POA: Diagnosis not present

## 2018-08-31 DIAGNOSIS — M25511 Pain in right shoulder: Secondary | ICD-10-CM | POA: Diagnosis not present

## 2018-08-31 NOTE — Patient Instructions (Addendum)
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 Try to avoid painful activities (overhead activities, lifting with extended arm) as much as possible. Aleve 2 tabs twice a day with food  for pain and inflammation only if needed. Can take tylenol in addition to this. Consider combination steroid injections for your shoulders if you're struggling Consider physical therapy with transition to home exercise program. Do home exercise program with theraband and scapular stabilization exercises daily 3 sets of 10 once a day. If not improving at follow-up we will consider injections, physical therapy, and/or nitro patches. Follow up with me in 6 weeks but call me sooner if you're struggling and want to go ahead with physical therapy or injection(s).

## 2018-09-02 ENCOUNTER — Encounter: Payer: Self-pay | Admitting: Family Medicine

## 2018-09-02 NOTE — Progress Notes (Signed)
PCP: Colon Branch, MD  Subjective:   HPI: Patient is a 77 y.o. male here for bilateral shoulder pain.  Patient reports he's had about 1 year of pain in both shoulders but worse on the left. Pain level 0/10 at rest but up to 3-4/10 at times, sharp. Had remote injury to shoulders when he was much younger and has had intermittent issues since then. Pain especially worse past month. Tried tylenol with only mild benefit. Does a lot of gardening. No skin changes, numbness.  Past Medical History:  Diagnosis Date  . Cataract   . COPD (chronic obstructive pulmonary disease) ([place])   . Diverticulosis    pt states he had diverticuli in his bladder also.  . H/O [facility] spotted fever 01/27/2014  . Hematuria    over 5 years ago  . Hernia of abdominal cavity   . History of rectal polyps   . Hyperplastic colon polyp   . Hypertension   . Macular degeneration   . Rectal cancer ([place]) 1994  . Shoulder pain, left    per pt, he can lay on the left side!  . Tubular adenoma    01/05/2013    Current Outpatient Medications on File Prior to Visit  Medication Sig Dispense Refill  . acetaminophen (TYLENOL) 500 MG tablet Take 1,000 mg by mouth every 6 (six) hours as needed for mild pain.    Marland Kitchen alendronate (FOSAMAX) 70 MG tablet Take 1 tablet (70 mg total) by mouth once a week. Take with a full glass of water on an empty stomach. 12 tablet 2  . carvedilol (COREG) 6.25 MG tablet Take 1 tablet (6.25 mg total) by mouth 2 (two) times daily with a meal. 180 tablet 2  . celecoxib (CELEBREX) 100 MG capsule Take 100 mg by mouth at bedtime as needed.    . clonazePAM (KLONOPIN) 1 MG tablet Take 0.5 tablets (0.5 mg total) by mouth 2 (two) times daily as needed for anxiety. 90 tablet 0  . clotrimazole-betamethasone (LOTRISONE) cream Apply 1 application topically 2 (two) times daily. 90 g 2  . Misc Natural Products (CURCUMAX PRO PO) Take 1,650 mg by mouth daily.    . NONFORMULARY OR COMPOUNDED ITEM     .  nortriptyline (PAMELOR) 50 MG capsule TAKE 1 CAPSULE AT BEDTIME 90 capsule 1  . omeprazole (PRILOSEC) 20 MG capsule Take 1 capsule (20 mg total) by mouth daily. 90 capsule 3   No current facility-administered medications on file prior to visit.     Past Surgical History:  Procedure Laterality Date  . APPENDECTOMY    . COLON SURGERY     rectal cancer ~ 1999  . EYE SURGERY     cataract sx  . HERNIA REPAIR    . IR KYPHO LUMBAR INC FX REDUCE BONE BX UNI/BIL CANNULATION INC/IMAGING  06/12/2017  . IR RADIOLOGIST EVAL & MGMT  08/06/2017  . mastoid gland removal     left ear  . PROSTATE SURGERY  05/2015  . TONSILLECTOMY      Allergies  Allergen Reactions  . Escitalopram Other (See Comments)    "made me feel drunk"    Social History   Socioeconomic History  . Marital status: Married    Spouse name: Denice Paradise  . Number of children: 2  . Years of education: Not on file  . Highest education level: Not on file  Occupational History  . Occupation: RetiredMultimedia programmer  . Financial resource strain: Not on file  .  Food insecurity:    Worry: Not on file    Inability: Not on file  . Transportation needs:    Medical: Not on file    Non-medical: Not on file  Tobacco Use  . Smoking status: Former Smoker    Last attempt to quit: 04/19/1989    Years since quitting: 29.3  . Smokeless tobacco: Never Used  Substance and Sexual Activity  . Alcohol use: Yes    Alcohol/week: 14.0 standard drinks    Types: 14 Glasses of wine per week    Comment: most days  , amounts varies , sometimes > 2 servings   . Drug use: No  . Sexual activity: Not on file  Lifestyle  . Physical activity:    Days per week: Not on file    Minutes per session: Not on file  . Stress: Not on file  Relationships  . Social connections:    Talks on phone: Not on file    Gets together: Not on file    Attends religious service: Not on file    Active member of club or organization: Not on file    Attends meetings  of clubs or organizations: Not on file    Relationship status: Not on file  . Intimate partner violence:    Fear of current or ex partner: Not on file    Emotionally abused: Not on file    Physically abused: Not on file    Forced sexual activity: Not on file  Other Topics Concern  . Not on file  Social History Narrative   Household-- pt, wife Denice Paradise)   [place] from [place] ~ [place] 21, [place]   Youngest son lives in [place] reason why he moved here        For Denice Paradise, this is her second marriage.   She lost her first husband.   Aaron Edelman adopted their son  [name] who died in 01-21-2010, he was an alcoholic.   They have together  one biological son.  He lives in [place].           Family History  Problem Relation Age of Onset  . Breast cancer Mother   . Colon cancer Mother 42       died 35  . Aneurysm Father   . Heart attack Father        28  . Aneurysm Son   . Stroke Paternal Grandfather   . Cancer Paternal Grandmother   . Cancer Maternal Grandmother   . Prostate cancer Neg Hx   . Esophageal cancer Neg Hx   . Pancreatic cancer Neg Hx   . Rectal cancer Neg Hx   . Stomach cancer Neg Hx     BP (!) 138/91   Pulse 76   Ht 6\' 3"  (1.905 m)   Wt 208 lb (94.3 kg)   BMI 26.00 kg/m   Review of Systems: See HPI above.     Objective:  Physical Exam:  Gen: NAD, comfortable in exam room  Right shoulder: No swelling, ecchymoses.  No gross deformity. No TTP AC joint.  Mild tenderness over glenohumeral joint. FROM with painful arc. Positive Hawkins, Neers. Negative Yergasons. Strength 5/5 with empty can and resisted internal/external rotation.  Pain empty can. Negative apprehension. NV intact distally.  Left shoulder: No swelling, ecchymoses.  No gross deformity. No TTP AC joint.  Mild tenderness glenohumeral joint. Full passive motion.  Active abduction only to 90 degrees, full IR and ER. Positive Hawkins, Neers. Negative Yergasons. Strength 4/5  with empty can and 5/5 resisted  internal/external rotation. Negative apprehension. NV intact distally.  MSK u/s left shoulder: biceps tendon intact with tenosynovitis.  Subscapularis intact.  AC joint with arthropathy and effusion.  Infraspinatus intact.  Supraspinatus with insertional side tear that appears old.  Most of supraspinatus intact.   Assessment & Plan:  1. Bilateral shoulder pain - 2/2 rotator cuff impingement with glenohumeral arthritis.  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  Discussed options - he would like to start with aleve and home exercises.  Consider combination injections, physical therapy, nitro patches.  F/u in 6 weeks.

## 2018-09-03 ENCOUNTER — Telehealth: Payer: Self-pay

## 2018-09-03 NOTE — Telephone Encounter (Signed)
New message    Just an FYI. We have made several attempts to contact this patient including sending a letter to schedule or reschedule their echocardiogram. We will be removing the patient from the echo WQ.   Thank you 

## 2018-09-03 NOTE — Telephone Encounter (Signed)
Noted  

## 2018-09-10 ENCOUNTER — Ambulatory Visit: Payer: Self-pay | Admitting: Internal Medicine

## 2018-09-11 ENCOUNTER — Ambulatory Visit (HOSPITAL_BASED_OUTPATIENT_CLINIC_OR_DEPARTMENT_OTHER): Payer: Medicare HMO

## 2018-09-17 ENCOUNTER — Telehealth: Payer: Self-pay

## 2018-09-17 NOTE — Telephone Encounter (Signed)
Copied from [place] [id number]. Topic: Referral - Request for Referral >> Sep 17, 2018 11:16 AM Devin [name] E wrote: Pt had to cancel his MRI and was told to call and reschedule it. When Pt called to reschedule he was advised that only Dr. Larose Kells can do so/ Pt needs to reschedule his MRI/please advise  Provider: [facility]  Dept: [facility]  Date: 09/11/18 Arrival Time: 8:00 AM   Pt also needs to reschedule his cardiology appt for 12.23.2019 due to an appt for his eye/ please advise

## 2018-09-17 NOTE — Telephone Encounter (Signed)
Message sent to [facility] in imaging about rescheduling MRI. Pt will have to call cardiology to reschedule.

## 2018-09-18 ENCOUNTER — Ambulatory Visit (HOSPITAL_BASED_OUTPATIENT_CLINIC_OR_DEPARTMENT_OTHER)
Admission: RE | Admit: 2018-09-18 | Discharge: 2018-09-18 | Disposition: A | Discharge status: Home / Self Care | Payer: Medicare HMO | Source: Ambulatory Visit | Attending: Internal Medicine | Admitting: Internal Medicine

## 2018-09-18 DIAGNOSIS — R410 Disorientation, unspecified: Secondary | ICD-10-CM | POA: Diagnosis not present

## 2018-09-18 DIAGNOSIS — I639 Cerebral infarction, unspecified: Secondary | ICD-10-CM | POA: Diagnosis not present

## 2018-09-18 DIAGNOSIS — R4789 Other speech disturbances: Secondary | ICD-10-CM | POA: Diagnosis not present

## 2018-09-20 ENCOUNTER — Ambulatory Visit (HOSPITAL_COMMUNITY): Payer: Medicare HMO | Attending: Internal Medicine

## 2018-09-20 DIAGNOSIS — H353211 Exudative age-related macular degeneration, right eye, with active choroidal neovascularization: Secondary | ICD-10-CM | POA: Diagnosis not present

## 2018-10-04 ENCOUNTER — Ambulatory Visit (HOSPITAL_BASED_OUTPATIENT_CLINIC_OR_DEPARTMENT_OTHER): Payer: Medicare HMO

## 2018-10-04 ENCOUNTER — Ambulatory Visit (HOSPITAL_COMMUNITY)
Admission: RE | Admit: 2018-10-04 | Discharge: 2018-10-04 | Disposition: A | Discharge status: Home / Self Care | Payer: Medicare HMO | Source: Ambulatory Visit | Attending: Cardiovascular Disease | Admitting: Cardiovascular Disease

## 2018-10-04 DIAGNOSIS — I639 Cerebral infarction, unspecified: Secondary | ICD-10-CM | POA: Insufficient documentation

## 2018-10-07 ENCOUNTER — Other Ambulatory Visit: Payer: Self-pay

## 2018-10-07 DIAGNOSIS — R9431 Abnormal electrocardiogram [ECG] [EKG]: Secondary | ICD-10-CM

## 2018-10-11 ENCOUNTER — Encounter: Payer: Self-pay | Admitting: Internal Medicine

## 2018-10-13 ENCOUNTER — Ambulatory Visit: Payer: Medicare HMO | Admitting: Family Medicine

## 2018-10-13 ENCOUNTER — Encounter: Payer: Self-pay | Admitting: Family Medicine

## 2018-10-13 VITALS — BP 129/82 | HR 71 | Ht 75.0 in | Wt 210.0 lb

## 2018-10-13 DIAGNOSIS — M545 Low back pain, unspecified: Secondary | ICD-10-CM

## 2018-10-13 NOTE — Progress Notes (Signed)
PCP: Colon Branch, MD  Subjective:   HPI: Patient is a 78 y.o. male here for low back pain.  Patient reports his shoulders are improved. Primary issue is pain in low back radiating into his hips more to the left side. No radiation into left leg. Pain level 2/10 and a soreness though gets a sharp branding-iron feeling into thighs at times that lasts up to 30 seconds. Worse with prolonged gardening. Topical ibuprofen helps. No skin changes, numbness. No bowel/bladder dysfunction.  Past Medical History:  Diagnosis Date  . Cataract   . COPD (chronic obstructive pulmonary disease) ([place])   . Diverticulosis    pt states he had diverticuli in his bladder also.  . H/O [facility] spotted fever 01/27/2014  . Hematuria    over 5 years ago  . Hernia of abdominal cavity   . History of rectal polyps   . Hyperplastic colon polyp   . Hypertension   . Macular degeneration   . Rectal cancer ([place]) 1994  . Shoulder pain, left    per pt, he can lay on the left side!  . Tubular adenoma    01/05/2013    Current Outpatient Medications on File Prior to Visit  Medication Sig Dispense Refill  . acetaminophen (TYLENOL) 500 MG tablet Take 1,000 mg by mouth every 6 (six) hours as needed for mild pain.    Marland Kitchen alendronate (FOSAMAX) 70 MG tablet Take 1 tablet (70 mg total) by mouth once a week. Take with a full glass of water on an empty stomach. 12 tablet 2  . carvedilol (COREG) 6.25 MG tablet Take 1 tablet (6.25 mg total) by mouth 2 (two) times daily with a meal. 180 tablet 2  . celecoxib (CELEBREX) 100 MG capsule Take 100 mg by mouth at bedtime as needed.    . clonazePAM (KLONOPIN) 1 MG tablet Take 0.5 tablets (0.5 mg total) by mouth 2 (two) times daily as needed for anxiety. 90 tablet 0  . clotrimazole-betamethasone (LOTRISONE) cream Apply 1 application topically 2 (two) times daily. 90 g 2  . Misc Natural Products (CURCUMAX PRO PO) Take 1,650 mg by mouth daily.    . NONFORMULARY OR COMPOUNDED ITEM      . nortriptyline (PAMELOR) 50 MG capsule TAKE 1 CAPSULE AT BEDTIME 90 capsule 1  . omeprazole (PRILOSEC) 20 MG capsule Take 1 capsule (20 mg total) by mouth daily. 90 capsule 3   No current facility-administered medications on file prior to visit.     Past Surgical History:  Procedure Laterality Date  . APPENDECTOMY    . COLON SURGERY     rectal cancer ~ 1999  . EYE SURGERY     cataract sx  . HERNIA REPAIR    . IR KYPHO LUMBAR INC FX REDUCE BONE BX UNI/BIL CANNULATION INC/IMAGING  06/12/2017  . IR RADIOLOGIST EVAL & MGMT  08/06/2017  . mastoid gland removal     left ear  . PROSTATE SURGERY  05/2015  . TONSILLECTOMY      Allergies  Allergen Reactions  . Escitalopram Other (See Comments)    "made me feel drunk"    Social History   Socioeconomic History  . Marital status: Married    Spouse name: Denice Paradise  . Number of children: 2  . Years of education: Not on file  . Highest education level: Not on file  Occupational History  . Occupation: RetiredMultimedia programmer  . Financial resource strain: Not on file  . Food insecurity:  Worry: Not on file    Inability: Not on file  . Transportation needs:    Medical: Not on file    Non-medical: Not on file  Tobacco Use  . Smoking status: Former Smoker    Last attempt to quit: 04/19/1989    Years since quitting: 29.5  . Smokeless tobacco: Never Used  Substance and Sexual Activity  . Alcohol use: Yes    Alcohol/week: 14.0 standard drinks    Types: 14 Glasses of wine per week    Comment: most days  , amounts varies , sometimes > 2 servings   . Drug use: No  . Sexual activity: Not on file  Lifestyle  . Physical activity:    Days per week: Not on file    Minutes per session: Not on file  . Stress: Not on file  Relationships  . Social connections:    Talks on phone: Not on file    Gets together: Not on file    Attends religious service: Not on file    Active member of club or organization: Not on file    Attends  meetings of clubs or organizations: Not on file    Relationship status: Not on file  . Intimate partner violence:    Fear of current or ex partner: Not on file    Emotionally abused: Not on file    Physically abused: Not on file    Forced sexual activity: Not on file  Other Topics Concern  . Not on file  Social History Narrative   Household-- pt, wife Denice Paradise)   [place] from [place] ~ [place], [place]   Youngest son lives in [place] reason why he moved here        For Denice Paradise, this is her second marriage.   She lost her first husband.   Aaron Edelman adopted their son  [name] who died in 12-31-09, he was an alcoholic.   They have together  one biological son.  He lives in [place].           Family History  Problem Relation Age of Onset  . Breast cancer Mother   . Colon cancer Mother 40       died 73  . Aneurysm Father   . Heart attack Father        29  . Aneurysm Son   . Stroke Paternal Grandfather   . Cancer Paternal Grandmother   . Cancer Maternal Grandmother   . Prostate cancer Neg Hx   . Esophageal cancer Neg Hx   . Pancreatic cancer Neg Hx   . Rectal cancer Neg Hx   . Stomach cancer Neg Hx     BP 129/82   Pulse 71   Ht 6\' 3"  (1.905 m)   Wt 210 lb (95.3 kg)   BMI 26.25 kg/m   Review of Systems: See HPI above.     Objective:  Physical Exam:  Gen: NAD, comfortable in exam room  Back: No gross deformity, scoliosis.  Mild kyphosis. No TTP .  No midline or bony TTP. FROM with mild pain on flexion. Strength LEs 5/5 all muscle groups.   2+ MSRs in patellar and achilles tendons, equal bilaterally. Negative SLRs. Sensation intact to light touch bilaterally.  Bilateral hips: No deformity. FROM with 5/5 strength. No tenderness to palpation. NVI distally. Negative logroll bilateral hips Negative fabers and piriformis stretches.   Assessment & Plan:  1. Low back pain - consistent with arthritis of lumbar spine.  Shown home  exercises to do daily.  Tylenol, topical medications,  supplements reviewed, aleve if needed.  Consider physical therapy.  F/u in 5-6 weeks.

## 2018-10-13 NOTE — Patient Instructions (Signed)
Your pain is due to arthritis of your low back. These are the different medications you can take for this: Tylenol 500mg  1-2 tabs three times a day for pain. Capsaicin, aspercreme, or biofreeze topically up to four times a day may also help with pain. Some supplements that may help for arthritis: Boswellia extract, curcumin, pycnogenol Aleve 1-2 tabs twice a day with food It's important that you continue to stay active. Knee to chest, cat/camel, pelvic tilt at least, do these daily for next 6 weeks. Consider physical therapy to strengthen muscles around the joint that hurts to take pressure off of the joint itself. Heat or ice 15 minutes at a time 3-4 times a day as needed to help with pain. Water aerobics and cycling with low resistance are the best two types of exercise for arthritis though any exercise is ok as long as it doesn't worsen the pain. Follow up with me in 5-6 weeks.

## 2018-10-15 ENCOUNTER — Telehealth: Payer: Self-pay | Admitting: Internal Medicine

## 2018-10-15 DIAGNOSIS — G3109 Other frontotemporal dementia: Principal | ICD-10-CM

## 2018-10-15 DIAGNOSIS — F0280 Dementia in other diseases classified elsewhere without behavioral disturbance: Secondary | ICD-10-CM

## 2018-10-15 NOTE — Telephone Encounter (Addendum)
I spoke with the patient's wife today, he continue with behavioral changes: Happy, sometimes angry, etc. 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 I spoke to the patient, I, told him that I am concerned about his memory and behavior as well as the recent TIA-like symptoms.  I mentioned the possibility of dementia. Recommend neurology evaluation, states he will proceed. Enter a neurology referral, rule out frontotemporal dementia

## 2018-10-15 NOTE — Telephone Encounter (Signed)
This referral was placed for you today.

## 2018-10-21 ENCOUNTER — Ambulatory Visit: Payer: Medicare HMO | Admitting: Neurology

## 2018-10-21 ENCOUNTER — Encounter: Payer: Self-pay | Admitting: Neurology

## 2018-10-21 DIAGNOSIS — R251 Tremor, unspecified: Secondary | ICD-10-CM | POA: Diagnosis not present

## 2018-10-21 HISTORY — DX: Tremor, unspecified: R25.1

## 2018-10-21 NOTE — Progress Notes (Signed)
Reason for visit: Tremor, emotional lability  Referring physician: Dr. Terance [name] is a 78 y.o. male  History of present illness:  Devin [name] is a 78 year old right-handed white male with a history of a tremor that has been present in his right arm he claims has been present over the last 15 years.  He denies any family history of tremor.  The tremor is notable upon resting the arm but also he may notice it when he is trying to feed himself at times, he has also noted that his handwriting has become sloppy, he denies any micrographia.  He has had no problems with mobility, no changes in speech or swallowing.  The patient is sent over to this office in part because of some emotional lability that has been present recently.  Upon questioning, the patient has had a lot of stressors in his life, he has had to deal with the loss of his son in 2011, prior to that he had lost his job, his house, and his pension.  The patient has had difficulty in his marriage, he does not get along well with his wife, they argue frequently, the patient finds it this is quite stressful for him.  He has nortriptyline to take, but he does not take this on a regular basis, he has clonazepam for anxiety but he takes this only occasionally.  He is sent to this office for concerns of possible frontotemporal dementia.  He denies any memory problems whatsoever.  He is able to manage his medications, appointments, he is able to operate a motor vehicle without difficulty, and he is able to manage his finances.  Past Medical History:  Diagnosis Date  . Cataract   . COPD (chronic obstructive pulmonary disease) ([place])   . Diverticulosis    pt states he had diverticuli in his bladder also.  . H/O [facility] spotted fever 01/27/2014  . Hematuria    over 5 years ago  . Hernia of abdominal cavity   . History of rectal polyps   . Hyperplastic colon polyp   . Hypertension   . Macular degeneration   . Rectal cancer ([place])  1994  . Shoulder pain, left    per pt, he can lay on the left side!  . Tubular adenoma    01/05/2013    Past Surgical History:  Procedure Laterality Date  . APPENDECTOMY    . COLON SURGERY     rectal cancer ~ 1999  . EYE SURGERY     cataract sx  . HERNIA REPAIR    . IR KYPHO LUMBAR INC FX REDUCE BONE BX UNI/BIL CANNULATION INC/IMAGING  06/12/2017  . IR RADIOLOGIST EVAL & MGMT  08/06/2017  . mastoid gland removal     left ear  . PROSTATE SURGERY  05/2015  . TONSILLECTOMY      Family History  Problem Relation Age of Onset  . Breast cancer Mother   . Colon cancer Mother 40       died 63  . Aneurysm Father   . Heart attack Father        61  . Aneurysm Son   . Stroke Paternal Grandfather   . Cancer Paternal Grandmother   . Cancer Maternal Grandmother   . Prostate cancer Neg Hx   . Esophageal cancer Neg Hx   . Pancreatic cancer Neg Hx   . Rectal cancer Neg Hx   . Stomach cancer Neg Hx     Social  history:  reports that he quit smoking about 29 years ago. He has never used smokeless tobacco. He reports current alcohol use of about 14.0 standard drinks of alcohol per week. He reports that he does not use drugs.  Medications:  Prior to Admission medications   Medication Sig Start Date End Date Taking? Authorizing Provider  alendronate (FOSAMAX) 70 MG tablet Take 1 tablet (70 mg total) by mouth once a week. Take with a full glass of water on an empty stomach. 06/09/18  Yes Colon Branch, MD  carvedilol (COREG) 6.25 MG tablet Take 1 tablet (6.25 mg total) by mouth 2 (two) times daily with a meal. 06/09/18  Yes Paz, Alda Berthold, MD  clonazePAM (KLONOPIN) 1 MG tablet Take 0.5 tablets (0.5 mg total) by mouth 2 (two) times daily as needed for anxiety. 06/09/18  Yes Paz, Alda Berthold, MD  clotrimazole-betamethasone (LOTRISONE) cream Apply 1 application topically 2 (two) times daily. 06/09/18  Yes Paz, Alda Berthold, MD  Misc Natural Products (CURCUMAX PRO PO) Take 1,650 mg by mouth daily.   Yes [provider]  NONFORMULARY OR COMPOUNDED ITEM    Yes [provider]  nortriptyline (PAMELOR) 50 MG capsule TAKE 1 CAPSULE AT BEDTIME Patient taking differently: PRN 08/02/18  Yes Paz, Alda Berthold, MD  omeprazole (PRILOSEC) 20 MG capsule Take 1 capsule (20 mg total) by mouth daily. 11/23/17  Yes Colon Branch, MD      Allergies  Allergen Reactions  . Escitalopram Other (See Comments)    "made me feel drunk"    ROS:  Out of a complete 14 system review of symptoms, the patient complains only of the following symptoms, and all other reviewed systems are negative.  Slurred speech Tremor  Blood pressure 134/80, pulse 79, height 6\' 3"  (1.905 m), weight 210 lb (95.3 kg).  Physical Exam  General: The patient is alert and cooperative at the time of the examination.  Eyes: Pupils are equal, round, and reactive to light. Discs are flat bilaterally.  Neck: The neck is supple, no carotid bruits are noted.  Respiratory: The respiratory examination is clear.  Cardiovascular: The cardiovascular examination reveals a regular rate and rhythm, no obvious murmurs or rubs are noted.  Skin: Extremities are without significant edema.  Neurologic Exam  Mental status: The patient is alert and oriented x 3 at the time of the examination. The patient has apparent normal recent and remote memory, with an apparently normal attention span and concentration ability.  Cranial nerves: Facial symmetry is present. There is good sensation of the face to pinprick and soft touch bilaterally. The strength of the facial muscles and the muscles to head turning and shoulder shrug are normal bilaterally. Speech is well enunciated, no aphasia or dysarthria is noted. Extraocular movements are full. Visual fields are full. The tongue is midline, and the patient has symmetric elevation of the soft palate. No obvious hearing deficits are noted.  Motor: The motor testing reveals 5 over 5 strength of all 4 extremities.  Good symmetric motor tone is noted throughout.  Sensory: Sensory testing is intact to pinprick, soft touch, vibration sensation, and position sense on all 4 extremities. No evidence of extinction is noted.  Coordination: Cerebellar testing reveals good finger-nose-finger and heel-to-shin bilaterally.  There is a resting tremor of the right upper extremity, the patient is able to draw spiral without tremor being translated into the spiral.  Gait and station: Gait is normal, but with walking the patient has a prominent  tremor involving the right hand and arm.  The patient has relatively symmetric arm swing. Tandem gait is normal. Romberg is negative. No drift is seen.  Reflexes: Deep tendon reflexes are symmetric and normal bilaterally. Toes are downgoing bilaterally.   Assessment/Plan:  1.  Resting tremor, right upper extremity  2.  Emotional lability  The patient has been under a lot of stress with his marriage and life events.  This may explain some of his emotional lability, he does not wish to go on an antidepressant, he has seen a marriage counselor previously.  The patient does have a resting tremor that suggests parkinsonism, but the patient has no other features of Parkinson disease and the tremor has been present for 15 years, has been daily since 2011 without any other features of parkinsonism.  We will follow the patient conservatively, the patient will be seen back in 6 months.  If needed in the future, a DAT scan can be done to determine whether he truly does have Parkinson's disease.  Devin Alexanders MD 10/21/2018 12:15 PM  [facility] [address] [place], [place] [address]  Phone [phone] Fax [phone]

## 2018-10-25 NOTE — Progress Notes (Addendum)
Subjective:   JP EASTHAM is a 78 y.o. male who presents for Medicare Annual/Subsequent preventive examination.  Pt continues to suffer from depression. Pt's son died a few years ago at which time his wife decided they should move from [place] to here to be near their other children. Pt reports all has been down hill since and his marriage continues to suffer. Pt reports he has tried counseling without success and prefers not to take many pills. Pt states he manages his depression with diet, exercise, and healthy hobbies like gardening and volunteering and cooking.  Review of Systems: No ROS.  Medicare Wellness Visit. Additional risk factors are reflected in the social history. Cardiac Risk Factors include: advanced age (>65men, >29 women);hypertension;male gender Sleep patterns: sleeps about 6-8 hrs per nt. Feels rested. Home Safety/Smoke Alarms: Feels safe in home. Smoke alarms in place.  Lives with wife in 2 story home.   Male:   CCS- 05/20/17. 5 yr recall PSA-  Lab Results  Component Value Date   PSA 3.4 02/06/2015       Objective:    Vitals: BP 130/84 (BP Location: Left Arm, Patient Position: Sitting, Cuff Size: Normal)   Pulse 76   Ht 6\' 3"  (1.905 m)   Wt 209 lb 9.6 oz (95.1 kg)   SpO2 96%   BMI 26.20 kg/m   Body mass index is 26.2 kg/m.  Advanced Directives 10/26/2018 07/13/2017 07/08/2017 06/07/2017 09/08/2016 05/18/2014  Does Patient Have a Medical Advance Directive? Yes No Yes Yes Yes Yes  Type of Paramedic of [place];Living will - [place];Living will [place];Living will Out of facility DNR (pink MOST or yellow form) Living will  Does patient want to make changes to medical advance directive? No - Patient declined - - No - Patient declined No - Patient declined -  Copy of [place] in Chart? Yes - validated most recent copy scanned in chart (See row information) - No - copy requested  No - copy requested - No - copy requested    Tobacco Social History   Tobacco Use  Smoking Status Former Smoker  . Last attempt to quit: 04/19/1989  . Years since quitting: 29.5  Smokeless Tobacco Never Used     Counseling given: Not Answered   Clinical Intake:     Pain : No/denies pain     Past Medical History:  Diagnosis Date  . Arthritis   . Cataract   . COPD (chronic obstructive pulmonary disease) ([place])   . Diverticulosis    pt states he had diverticuli in his bladder also.  . H/O [facility] spotted fever 01/27/2014  . Hematuria    over 5 years ago  . Hernia of abdominal cavity   . History of rectal polyps   . Hyperplastic colon polyp   . Hypertension   . Macular degeneration   . Rectal cancer ([place]) 1994  . Shoulder pain, left    per pt, he can lay on the left side!  . Tremor 10/21/2018  . Tubular adenoma    01/05/2013   Past Surgical History:  Procedure Laterality Date  . APPENDECTOMY    . COLON SURGERY     rectal cancer ~ 1999  . EYE SURGERY     cataract sx  . HERNIA REPAIR    . IR KYPHO LUMBAR INC FX REDUCE BONE BX UNI/BIL CANNULATION INC/IMAGING  06/12/2017  . IR RADIOLOGIST EVAL & MGMT  08/06/2017  .  mastoid gland removal     left ear  . PROSTATE SURGERY  05/2015  . TONSILLECTOMY     Family History  Problem Relation Age of Onset  . Breast cancer Mother   . Colon cancer Mother 51       died 48  . Aneurysm Father   . Heart attack Father        73  . Aneurysm Son   . Stroke Paternal Grandfather   . Cancer Paternal Grandmother   . Cancer Maternal Grandmother   . Prostate cancer Neg Hx   . Esophageal cancer Neg Hx   . Pancreatic cancer Neg Hx   . Rectal cancer Neg Hx   . Stomach cancer Neg Hx    Social History   Socioeconomic History  . Marital status: Married    Spouse name: Denice Paradise  . Number of children: 2  . Years of education: Not on file  . Highest education level: Not on file  Occupational History  . Occupation: RetiredFirefighter  . Financial resource strain: Not on file  . Food insecurity:    Worry: Not on file    Inability: Not on file  . Transportation needs:    Medical: Not on file    Non-medical: Not on file  Tobacco Use  . Smoking status: Former Smoker    Last attempt to quit: 04/19/1989    Years since quitting: 29.5  . Smokeless tobacco: Never Used  Substance and Sexual Activity  . Alcohol use: Yes    Alcohol/week: 14.0 standard drinks    Types: 14 Glasses of wine per week    Comment: most days  , amounts varies , sometimes > 2 servings   . Drug use: No  . Sexual activity: Not Currently  Lifestyle  . Physical activity:    Days per week: Not on file    Minutes per session: Not on file  . Stress: Not on file  Relationships  . Social connections:    Talks on phone: Not on file    Gets together: Not on file    Attends religious service: Not on file    Active member of club or organization: Not on file    Attends meetings of clubs or organizations: Not on file    Relationship status: Not on file  Other Topics Concern  . Not on file  Social History Narrative   Household-- pt, wife Denice Paradise)   [place] from [place] ~ 01/17/2010   Youngest son lives in [place] reason why he moved here        For Denice Paradise, this is her second marriage.   She lost her first husband.   Aaron Edelman adopted their son  [name] who died in 01/17/2010, he was an alcoholic.   They have together  one biological son.  He lives in [place].      Right handed   Occasional caffeine usage      Outpatient Encounter Medications as of 10/26/2018  Medication Sig  . alendronate (FOSAMAX) 70 MG tablet Take 1 tablet (70 mg total) by mouth once a week. Take with a full glass of water on an empty stomach.  . carvedilol (COREG) 6.25 MG tablet Take 1 tablet (6.25 mg total) by mouth 2 (two) times daily with a meal.  . clonazePAM (KLONOPIN) 1 MG tablet Take 0.5 tablets (0.5 mg total) by mouth 2 (two) times daily as needed for anxiety.  .  clotrimazole-betamethasone (LOTRISONE) cream Apply 1  application topically 2 (two) times daily.  . Misc Natural Products (CURCUMAX PRO PO) Take 1,650 mg by mouth daily.  . NONFORMULARY OR COMPOUNDED ITEM   . nortriptyline (PAMELOR) 50 MG capsule TAKE 1 CAPSULE AT BEDTIME (Patient taking differently: PRN)  . omeprazole (PRILOSEC) 20 MG capsule Take 1 capsule (20 mg total) by mouth daily.   No facility-administered encounter medications on file as of 10/26/2018.     Activities of Daily Living In your present state of health, do you have any difficulty performing the following activities: 10/26/2018 07/12/2018  Hearing? 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 -  Vision? N N  Difficulty concentrating or making decisions? N N  Walking or climbing stairs? N N  Dressing or bathing? N N  Doing errands, shopping? N N  Preparing Food and eating ? N -  Using the Toilet? N -  In the past six months, have you accidently leaked urine? N -  Do you have problems with loss of bowel control? N -  Managing your Medications? N -  Managing your Finances? N -  Housekeeping or managing your Housekeeping? N -  Some recent data might be hidden    Patient Care Team: Colon Branch, MD as PCP - General (Internal Medicine) Puschinsky, Fransico Him., MD as Consulting Physician (General Surgery) [place] Crane, MD as Consulting Physician (Internal Medicine) Zadie Rhine Clent Demark, MD as Consulting Physician (Ophthalmology)   Assessment:   This is a routine wellness examination for [place]. Physical assessment deferred to PCP.  Exercise Activities and Dietary recommendations Current Exercise Habits: Home exercise routine, Type of exercise: stretching;Other - see comments(rowing machine and bike), Time (Minutes): 30, Frequency (Times/Week): 3, Weekly Exercise (Minutes/Week): 90, Intensity: Mild, Exercise limited by: None identified   Diet (meal preparation, eat out, water intake, caffeinated beverages, dairy products,  fruits and vegetables): in general, a "healthy" diet  , well balanced, on average, 3 meals per day Eats fish 3x/wk, fresh vegetables every day. Drinks 3-4 glasses of water per day.     Goals    . Maintain healty lifestyle       Fall Risk Fall Risk  10/26/2018 02/09/2018 12/15/2017 11/17/2017 10/26/2017  Falls in the past year? 1 Yes No Yes Yes  Number falls in past yr: 1 2 or more - 1 1  Injury with Fall? 0 No - No Yes  Risk for fall due to : Impaired balance/gait - - Impaired mobility;Impaired balance/gait Impaired mobility;Impaired vision    Depression Screen PHQ 2/9 Scores 10/26/2018 08/30/2018 10/21/2017 09/08/2016  PHQ - 2 Score 1 1 0 0  PHQ- 9 Score - 4 - -    Cognitive Function Ad8 score reviewed for issues:  Issues making decisions:no  Less interest in hobbies / activities:no  Repeats questions, stories (family complaining):no  Trouble using ordinary gadgets (microwave, computer, phone):no  Forgets the month or year: no  Mismanaging finances: no  Remembering appts:no  Daily problems with thinking and/or memory:no Ad8 score is=0   MMSE - Mini Mental State Exam 10/21/2018 09/08/2016  Orientation to time 4 5  Orientation to Place 5 5  Registration 3 3  Attention/ Calculation 5 5  Recall 3 3  Language- name 2 objects 2 2  Language- repeat 1 1  Language- follow 3 step command 3 3  Language- read & follow direction 1 1  Write a sentence 1 1  Copy design 1 1  Total score 29 30  Immunization History  Administered Date(s) Administered  . Influenza, High Dose Seasonal PF 07/12/2018  . Influenza,inj,Quad PF,6+ Mos 07/27/2014, 05/30/2015, 06/13/2016, 06/19/2017  . Pneumococcal Conjugate-13 07/27/2014  . Pneumococcal Polysaccharide-23 10/21/2017  . Pneumococcal-Unspecified 09/30/2007  . Td 10/21/2017  . Tdap 05/26/2007  . Zoster 09/30/2007    Screening Tests Health Maintenance  Topic Date Due  . COLONOSCOPY  05/20/2022  . TETANUS/TDAP  10/22/2027    . INFLUENZA VACCINE  Completed  . PNA vac Low Risk Adult  Completed      Plan:    Please schedule your next medicare wellness visit with me in 1 yr.  Continue to eat heart healthy diet (full of fruits, vegetables, whole grains, lean protein, water--limit salt, fat, and sugar intake) and increase physical activity as tolerated.  Continue healthy hobbies such as gardening, cooking, and volunteering.  Enjoy your trip to [place].  I have personally reviewed and noted the following in the patient's chart:   . Medical and social history . Use of alcohol, tobacco or illicit drugs  . Current medications and supplements . Functional ability and status . Nutritional status . Physical activity . Advanced directives . List of other physicians . Hospitalizations, surgeries, and ER visits in previous 12 months . Vitals . Screenings to include cognitive, depression, and falls . Referrals and appointments  In addition, I have reviewed and discussed with patient certain preventive protocols, quality metrics, and best practice recommendations. A written personalized care plan for preventive services as well as general preventive health recommendations were provided to patient.     Naaman Plummer [place], [place]  10/26/2018  Kathlene November, MD

## 2018-10-26 ENCOUNTER — Encounter: Payer: Self-pay | Admitting: *Deleted

## 2018-10-26 ENCOUNTER — Ambulatory Visit (INDEPENDENT_AMBULATORY_CARE_PROVIDER_SITE_OTHER): Payer: Medicare HMO | Admitting: Internal Medicine

## 2018-10-26 ENCOUNTER — Encounter: Payer: Self-pay | Admitting: Internal Medicine

## 2018-10-26 ENCOUNTER — Ambulatory Visit (INDEPENDENT_AMBULATORY_CARE_PROVIDER_SITE_OTHER): Payer: Medicare HMO | Admitting: *Deleted

## 2018-10-26 ENCOUNTER — Telehealth: Payer: Self-pay

## 2018-10-26 VITALS — BP 130/84 | HR 76 | Ht 75.0 in | Wt 209.4 lb

## 2018-10-26 VITALS — BP 130/84 | HR 76 | Ht 75.0 in | Wt 209.6 lb

## 2018-10-26 DIAGNOSIS — G4700 Insomnia, unspecified: Secondary | ICD-10-CM | POA: Diagnosis not present

## 2018-10-26 DIAGNOSIS — I10 Essential (primary) hypertension: Secondary | ICD-10-CM

## 2018-10-26 DIAGNOSIS — F32A Depression, unspecified: Secondary | ICD-10-CM

## 2018-10-26 DIAGNOSIS — F419 Anxiety disorder, unspecified: Secondary | ICD-10-CM | POA: Diagnosis not present

## 2018-10-26 DIAGNOSIS — F329 Major depressive disorder, single episode, unspecified: Secondary | ICD-10-CM

## 2018-10-26 DIAGNOSIS — M1990 Unspecified osteoarthritis, unspecified site: Secondary | ICD-10-CM

## 2018-10-26 DIAGNOSIS — Z0000 Encounter for general adult medical examination without abnormal findings: Secondary | ICD-10-CM | POA: Diagnosis not present

## 2018-10-26 LAB — LIPID PANEL
Cholesterol: 165 mg/dL (ref 0–200)
HDL: 52.5 mg/dL (ref >39.00)
LDL Cholesterol: 93 mg/dL (ref 0–99)
NonHDL: 112.15
Total CHOL/HDL Ratio: 3
Triglycerides: 95 mg/dL (ref 0.0–149.0)
VLDL: 19 mg/dL (ref 0.0–40.0)

## 2018-10-26 MED ORDER — CLONAZEPAM 1 MG PO TABS: 0.5000 mg | ORAL_TABLET | Freq: Two times a day (BID) | ORAL | 0 refills | Status: DC | PRN

## 2018-10-26 MED ORDER — NORTRIPTYLINE HCL 50 MG PO CAPS: 50.0000 mg | ORAL_CAPSULE | Freq: Every day | ORAL | 1 refills | Status: DC

## 2018-10-26 MED ORDER — ALENDRONATE SODIUM 70 MG PO TABS: 70.0000 mg | ORAL_TABLET | ORAL | 2 refills | Status: DC

## 2018-10-26 MED ORDER — OMEPRAZOLE 20 MG PO CPDR: 20.0000 mg | DELAYED_RELEASE_CAPSULE | Freq: Every day | ORAL | 3 refills | Status: DC

## 2018-10-26 MED ORDER — CARVEDILOL 6.25 MG PO TABS: 6.2500 mg | ORAL_TABLET | Freq: Two times a day (BID) | ORAL | 2 refills | Status: DC

## 2018-10-26 NOTE — Patient Instructions (Addendum)
GO TO THE LAB : Get the blood work     GO TO THE FRONT DESK Schedule your next appointment  foa physical in 4 months

## 2018-10-26 NOTE — Telephone Encounter (Signed)
Pt was unable to afford.

## 2018-10-26 NOTE — Telephone Encounter (Signed)
New message    Just an FYI.  We have made several attempts to contact this patient including sending a letter to schedule or reschedule their MYOCARDIAL PERFUSION. We will be removing the patient from the WQ.   Thank you 

## 2018-10-26 NOTE — Progress Notes (Signed)
Subjective:    Patient ID: Devin [name], male    DOB: July 24, 1941, 78 y.o.   MRN: [id number]  DOS:  10/26/2018 Type of visit - description: rov Since the last office visit he is doing well DJD: Going to the gym frequently, feeling very good Tremor, emotional lability: Note from neurology reviewed HTN: Good med compliance Anxiety depression: At baseline  Review of Systems  No chest pain no difficulty breathing No nausea vomiting or diarrhea Past Medical History:  Diagnosis Date  . Arthritis   . Cataract   . COPD (chronic obstructive pulmonary disease) ([place])   . Diverticulosis    pt states he had diverticuli in his bladder also.  . H/O [facility] spotted fever 01/27/2014  . Hematuria    over 5 years ago  . Hernia of abdominal cavity   . History of rectal polyps   . Hyperplastic colon polyp   . Hypertension   . Macular degeneration   . Rectal cancer ([place]) 1994  . Shoulder pain, left    per pt, he can lay on the left side!  . Tremor 10/21/2018  . Tubular adenoma    01/05/2013    Past Surgical History:  Procedure Laterality Date  . APPENDECTOMY    . COLON SURGERY     rectal cancer ~ 1999  . EYE SURGERY     cataract sx  . HERNIA REPAIR    . IR KYPHO LUMBAR INC FX REDUCE BONE BX UNI/BIL CANNULATION INC/IMAGING  06/12/2017  . IR RADIOLOGIST EVAL & MGMT  08/06/2017  . mastoid gland removal     left ear  . PROSTATE SURGERY  05/2015  . TONSILLECTOMY      Social History   Socioeconomic History  . Marital status: Married    Spouse name: Denice Paradise  . Number of children: 2  . Years of education: Not on file  . Highest education level: Not on file  Occupational History  . Occupation: RetiredMultimedia programmer  . Financial resource strain: Not on file  . Food insecurity:    Worry: Not on file    Inability: Not on file  . Transportation needs:    Medical: Not on file    Non-medical: Not on file  Tobacco Use  . Smoking status: Former Smoker    Last attempt to  quit: 04/19/1989    Years since quitting: 29.5  . Smokeless tobacco: Never Used  Substance and Sexual Activity  . Alcohol use: Yes    Alcohol/week: 14.0 standard drinks    Types: 14 Glasses of wine per week    Comment: most days  , amounts varies , sometimes > 2 servings   . Drug use: No  . Sexual activity: Not Currently  Lifestyle  . Physical activity:    Days per week: Not on file    Minutes per session: Not on file  . Stress: Not on file  Relationships  . Social connections:    Talks on phone: Not on file    Gets together: Not on file    Attends religious service: Not on file    Active member of club or organization: Not on file    Attends meetings of clubs or organizations: Not on file    Relationship status: Not on file  . Intimate partner violence:    Fear of current or ex partner: Not on file    Emotionally abused: Not on file    Physically abused: Not on file  Forced sexual activity: Not on file  Other Topics Concern  . Not on file  Social History Narrative   Household-- pt, wife Denice Paradise)   [place] from [place] ~ 2009/12/30   Youngest son lives in [place] reason why he moved here        For Denice Paradise, this is her second marriage.   She lost her first husband.   Aaron Edelman adopted their son  [name] who died in 12-30-09, he was an alcoholic.   They have together  one biological son.  He lives in [place].      Right handed   Occasional caffeine usage        Allergies as of 10/26/2018      Reactions   Escitalopram Other (See Comments)   "made me feel drunk"      Medication List       Accurate as of October 26, 2018 11:59 PM. Always use your most recent med list.        alendronate 70 MG tablet Commonly known as:  FOSAMAX Take 1 tablet (70 mg total) by mouth once a week. Take with a full glass of water on an empty stomach.   carvedilol 6.25 MG tablet Commonly known as:  COREG Take 1 tablet (6.25 mg total) by mouth 2 (two) times daily with a meal.   clonazePAM 1 MG  tablet Commonly known as:  KLONOPIN Take 0.5 tablets (0.5 mg total) by mouth 2 (two) times daily as needed for anxiety.   clotrimazole-betamethasone cream Commonly known as:  LOTRISONE Apply 1 application topically 2 (two) times daily.   CURCUMAX PRO PO Take 1,650 mg by mouth daily.   NONFORMULARY OR COMPOUNDED ITEM   nortriptyline 50 MG capsule Commonly known as:  PAMELOR Take 1 capsule (50 mg total) by mouth at bedtime.   omeprazole 20 MG capsule Commonly known as:  PRILOSEC Take 1 capsule (20 mg total) by mouth daily.           Objective:   Physical Exam BP 130/84 (BP Location: Left Arm, Patient Position: Sitting, Cuff Size: Normal)   Pulse 76   Ht 6\' 3"  (1.905 m)   Wt 209 lb 6 oz (95 kg)   SpO2 96%   BMI 26.17 kg/m  General:   Well developed, NAD, BMI noted. HEENT:  Normocephalic . Face symmetric, atraumatic Lungs:  CTA B Normal respiratory effort, no intercostal retractions, no accessory muscle use. Heart: RRR,  no murmur.  No pretibial edema bilaterally  Skin: Not pale. Not jaundice Neurologic:  alert & oriented X3.  Speech normal, gait appropriate for age and unassisted Psych--  Cognition and judgment appear intact.  Cooperative with normal attention span and concentration.  Behavior appropriate. No anxious or depressed appearing.      Assessment     Assessment HTN Depression-insomnia, lost a son December 30, 2009 GERD sx onset 2014/12/31 MSK: --DJD- hip injection 12-31-2015  --L2 vertebral fracture d/t a fall , kyphoplasty 05-2017 --T score 06-2017 -0.2, was rx ca and vit D.  Started  Fosamax 09-2017 BPH --s/p surgery x2 12-31-2014 , Dr Hazle Nordmann H/o etoh, mild  Rectal cancer-- s/p surgery ~ 1997/12/30, no chemo-XRT FH CAD, F MI age 13 Former light smoker , No symptoms  PLAN HTN: Controlled, check a FLP Tremor: Seen by neurology 10/21/2018 for tremors and emotional lability. Lability was felt to be due to a stress.  Resting tremor suggested Parkinson but he had no other  features.  The plan is to follow-up  clinically. TIA stroke?  Since the last visit had a non-acute brain MRI;  echo with normal EF and akinesis of the basal inferior myocardium.  The  carotid US was wnl. Was unable to complete the stress test due to cost, denies chest pain. Anxiety, depression, insomnia: Situation at home has not changed, still having issues with his wife.  Overall stable.  Good compliance with medications. DJD: Saw sports medicine, back pain felt to be due to DJD, was recommended increase exercise, patient is doing that and feels better RTC 4 months CPX.

## 2018-10-26 NOTE — Patient Instructions (Signed)
Please schedule your next medicare wellness visit with me in 1 yr.  Continue to eat heart healthy diet (full of fruits, vegetables, whole grains, lean protein, water--limit salt, fat, and sugar intake) and increase physical activity as tolerated.  Continue healthy hobbies such as gardening, cooking, and volunteering.  Enjoy your trip to [place].   Devin [name] , Thank you for taking time to come for your Medicare Wellness Visit. I appreciate your ongoing commitment to your health goals. Please review the following plan we discussed and let me know if I can assist you in the future.   These are the goals we discussed: Goals    . Maintain healty lifestyle       This is a list of the screening recommended for you and due dates:  Health Maintenance  Topic Date Due  . Colon Cancer Screening  05/20/2022  . Tetanus Vaccine  10/22/2027  . Flu Shot  Completed  . Pneumonia vaccines  Completed    Health Maintenance After Age 57 After age 79, you are at a higher risk for certain long-term diseases and infections as well as injuries from falls. Falls are a major cause of broken bones and head injuries in people who are older than age 13. Getting regular preventive care can help to keep you healthy and well. Preventive care includes getting regular testing and making lifestyle changes as recommended by your health care provider. Talk with your health care provider about:  Which screenings and tests you should have. A screening is a test that checks for a disease when you have no symptoms.  A diet and exercise plan that is right for you. What should I know about screenings and tests to prevent falls? Screening and testing are the best ways to find a health problem early. Early diagnosis and treatment give you the best chance of managing medical conditions that are common after age 41. Certain conditions and lifestyle choices may make you more likely to have a fall. Your health care provider may  recommend:  Regular vision checks. Poor vision and conditions such as cataracts can make you more likely to have a fall. If you wear glasses, make sure to get your prescription updated if your vision changes.  Medicine review. Work with your health care provider to regularly review all of the medicines you are taking, including over-the-counter medicines. Ask your health care provider about any side effects that may make you more likely to have a fall. Tell your health care provider if any medicines that you take make you feel dizzy or sleepy.  Osteoporosis screening. Osteoporosis is a condition that causes the bones to get weaker. This can make the bones weak and cause them to break more easily.  Blood pressure screening. Blood pressure changes and medicines to control blood pressure can make you feel dizzy.  Strength and balance checks. Your health care provider may recommend certain tests to check your strength and balance while standing, walking, or changing positions.  Foot health exam. Foot pain and numbness, as well as not wearing proper footwear, can make you more likely to have a fall.  Depression screening. You may be more likely to have a fall if you have a fear of falling, feel emotionally low, or feel unable to do activities that you used to do.  Alcohol use screening. Using too much alcohol can affect your balance and may make you more likely to have a fall. What actions can I take to lower my risk  of falls? General instructions  Talk with your health care provider about your risks for falling. Tell your health care provider if: ? You fall. Be sure to tell your health care provider about all falls, even ones that seem minor. ? You feel dizzy, sleepy, or off-balance.  Take over-the-counter and prescription medicines only as told by your health care provider. These include any supplements.  Eat a healthy diet and maintain a healthy weight. A healthy diet includes low-fat dairy  products, low-fat (lean) meats, and fiber from whole grains, beans, and lots of fruits and vegetables. Home safety  Remove any tripping hazards, such as rugs, cords, and clutter.  Install safety equipment such as grab bars in bathrooms and safety rails on stairs.  Keep rooms and walkways well-lit. Activity   Follow a regular exercise program to stay fit. This will help you maintain your balance. Ask your health care provider what types of exercise are appropriate for you.  If you need a cane or walker, use it as recommended by your health care provider.  Wear supportive shoes that have nonskid soles. Lifestyle  Do not drink alcohol if your health care provider tells you not to drink.  If you drink alcohol, limit how much you have: ? 0-1 drink a day for women. ? 0-2 drinks a day for men.  Be aware of how much alcohol is in your drink. In the [place] one drink equals one typical bottle of beer (12 oz), one-half glass of wine (5 oz), or one shot of hard liquor (1 oz).  Do not use any products that contain nicotine or tobacco, such as cigarettes and e-cigarettes. If you need help quitting, ask your health care provider. Summary  Having a healthy lifestyle and getting preventive care can help to protect your health and wellness after age 72.  Screening and testing are the best way to find a health problem early and help you avoid having a fall. Early diagnosis and treatment give you the best chance for managing medical conditions that are more common for people who are older than age 15.  Falls are a major cause of broken bones and head injuries in people who are older than age 87. Take precautions to prevent a fall at home.  Work with your health care provider to learn what changes you can make to improve your health and wellness and to prevent falls. This information is not intended to replace advice given to you by your health care provider. Make sure you discuss any questions you  have with your health care provider. Document Released: 07/29/2017 Document Revised: 07/29/2017 Document Reviewed: 07/29/2017 Elsevier Interactive Patient Education  2019 [employer].

## 2018-10-26 NOTE — Telephone Encounter (Signed)
Noted, thank you

## 2018-10-26 NOTE — Progress Notes (Signed)
Pre visit review using our clinic review tool, if applicable. No additional management support is needed unless otherwise documented below in the visit note. 

## 2018-10-27 NOTE — Assessment & Plan Note (Signed)
HTN: Controlled, check a FLP Tremor: Seen by neurology 10/21/2018 for tremors and emotional lability. Lability was felt to be due to a stress.  Resting tremor suggested Parkinson but he had no other features.  The plan is to follow-up clinically. TIA stroke?  Since the last visit had a non-acute brain MRI;  echo with normal EF and akinesis of the basal inferior myocardium.  The  carotid US was wnl. Was unable to complete the stress test due to cost, denies chest pain. Anxiety, depression, insomnia: Situation at home has not changed, still having issues with his wife.  Overall stable.  Good compliance with medications. DJD: Saw sports medicine, back pain felt to be due to DJD, was recommended increase exercise, patient is doing that and feels better RTC 4 months CPX.

## 2018-11-01 ENCOUNTER — Ambulatory Visit: Payer: Self-pay | Admitting: Internal Medicine

## 2018-11-09 ENCOUNTER — Encounter: Payer: Self-pay | Admitting: Internal Medicine

## 2018-11-10 ENCOUNTER — Other Ambulatory Visit: Payer: Self-pay | Admitting: Internal Medicine

## 2018-11-10 MED ORDER — ZOSTER VAC RECOMB ADJUVANTED 50 MCG/0.5ML IM SUSR: 0.5000 mL | Freq: Once | INTRAMUSCULAR | 1 refills | Status: AC

## 2018-11-16 ENCOUNTER — Encounter: Payer: Self-pay | Admitting: Family Medicine

## 2018-11-18 ENCOUNTER — Ambulatory Visit: Payer: Medicare HMO | Admitting: Podiatry

## 2018-11-18 DIAGNOSIS — Q828 Other specified congenital malformations of skin: Secondary | ICD-10-CM | POA: Diagnosis not present

## 2018-11-18 NOTE — Patient Instructions (Signed)

## 2018-11-19 ENCOUNTER — Ambulatory Visit (INDEPENDENT_AMBULATORY_CARE_PROVIDER_SITE_OTHER): Payer: Medicare HMO | Admitting: Family Medicine

## 2018-11-19 ENCOUNTER — Ambulatory Visit: Payer: Medicare HMO | Admitting: Family Medicine

## 2018-11-19 ENCOUNTER — Encounter: Payer: Self-pay | Admitting: Family Medicine

## 2018-11-19 VITALS — BP 124/82 | HR 96 | Temp 98.2°F | Ht 74.5 in | Wt 211.0 lb

## 2018-11-19 DIAGNOSIS — M25552 Pain in left hip: Secondary | ICD-10-CM | POA: Diagnosis not present

## 2018-11-19 DIAGNOSIS — M25551 Pain in right hip: Secondary | ICD-10-CM | POA: Diagnosis not present

## 2018-11-19 MED ORDER — METHYLPREDNISOLONE ACETATE 40 MG/ML IJ SUSP
40.0000 mg | Freq: Once | INTRAMUSCULAR | Status: AC
  Administered 2018-11-19: 40 mg via INTRA_ARTICULAR

## 2018-11-19 NOTE — Progress Notes (Signed)
Musculoskeletal Exam  Patient: Devin [name] DOB: 1941-01-24  DOS: 11/19/2018  SUBJECTIVE:  Chief Complaint:   CC: Hip pain  Devin [name] is a 78 y.o.  male for evaluation and treatment of f/u b/l hip pain.  Had success w injection in past. Travelling to [place] soon, plans to do walking and has not been able to garden bc of pain.  ROS: Musculoskeletal/Extremities: +hip pain  Past Medical History:  Diagnosis Date  . Arthritis   . Cataract   . COPD (chronic obstructive pulmonary disease) ([place])   . Diverticulosis    pt states he had diverticuli in his bladder also.  . H/O [facility] spotted fever 01/27/2014  . Hematuria    over 5 years ago  . Hernia of abdominal cavity   . History of rectal polyps   . Hyperplastic colon polyp   . Hypertension   . Macular degeneration   . Rectal cancer ([place]) 1994  . Shoulder pain, left    per pt, he can lay on the left side!  . Tremor 10/21/2018  . Tubular adenoma    01/05/2013    Objective: VITAL SIGNS: BP 124/82 (BP Location: Left Arm, Patient Position: Sitting, Cuff Size: Normal)   Pulse 96   Temp 98.2 F (36.8 C) (Oral)   Ht 6' 2.5" (1.892 m)   Wt 211 lb (95.7 kg)   SpO2 94%   BMI 26.73 kg/m  Constitutional: Well formed, well developed. No acute distress. Musculoskeletal: b/l hip.   Tenderness to palpation: yes, over greater troch bursa b/l Deformity: no Ecchymosis: no Neurologic: Normal sensory function. No focal deficits noted. Antalgic gait. Psychiatric: Normal mood. Age appropriate judgment and insight. Alert & oriented x 3.    Procedure note: Greater trochanteric bursa injection Verbal consent obtained. The area of interest was palpated and demarcated with an otoscope speculum. It was cleaned with an alcohol swab. Freeze spray was used. A 27 g needle was inserted at a perpendicular angle through the area of interested. The plunger was withdrawn to ensure our placement was not in a vessel. 2 mL of 1% lidocaine without  epi and 40 mg of Depomedrol was injected. A bandaid was placed. This process was repeated on the contralateral side. The patient tolerated the procedure well.  There were no complications noted.    Assessment:  Pain of both hip joints - Plan: methylPREDNISolone acetate (DEPO-MEDROL) injection 40 mg, PR DRAIN/INJECT LARGE JOINT/BURSA  Plan: Orders as above. If he does not continue to do well with injections, will refer to ortho. F/u prn. The patient voiced understanding and agreement to the plan.   [place], DO 11/19/18  11:02 AM

## 2018-11-22 ENCOUNTER — Ambulatory Visit: Payer: Self-pay | Admitting: Family Medicine

## 2018-11-22 DIAGNOSIS — H353211 Exudative age-related macular degeneration, right eye, with active choroidal neovascularization: Secondary | ICD-10-CM | POA: Diagnosis not present

## 2018-11-27 ENCOUNTER — Encounter: Payer: Self-pay | Admitting: Podiatry

## 2018-11-27 NOTE — Progress Notes (Signed)
Subjective:  Patient presents to clinic with cc of  painful callus  both feet which are aggravated when weightbearing with and without shoe gear.  This pain limits his daily activities. Pain symptoms resolve with periodic professional debridement.  Patient states he will be going to [place] March 4th for a month and he will be walking the [facility] which is about 100 miles.  Colon Branch, MD is his PCP and last visit was 10/26/2018.   Current Outpatient Medications:  .  alendronate (FOSAMAX) 70 MG tablet, Take 1 tablet (70 mg total) by mouth once a week. Take with a full glass of water on an empty stomach., Disp: 12 tablet, Rfl: 2 .  carvedilol (COREG) 6.25 MG tablet, Take 1 tablet (6.25 mg total) by mouth 2 (two) times daily with a meal., Disp: 180 tablet, Rfl: 2 .  clonazePAM (KLONOPIN) 1 MG tablet, Take 0.5 tablets (0.5 mg total) by mouth 2 (two) times daily as needed for anxiety., Disp: 90 tablet, Rfl: 0 .  clotrimazole-betamethasone (LOTRISONE) cream, Apply 1 application topically 2 (two) times daily., Disp: 90 g, Rfl: 2 .  Misc Natural Products (CURCUMAX PRO PO), Take 1,650 mg by mouth daily., Disp: , Rfl:  .  NONFORMULARY OR COMPOUNDED ITEM, , Disp: , Rfl:  .  nortriptyline (PAMELOR) 50 MG capsule, Take 1 capsule (50 mg total) by mouth at bedtime., Disp: 90 capsule, Rfl: 1 .  omeprazole (PRILOSEC) 20 MG capsule, Take 1 capsule (20 mg total) by mouth daily., Disp: 90 capsule, Rfl: 3   Allergies  Allergen Reactions  . Escitalopram Other (See Comments)    "made me feel drunk"     Objective:  Physical Examination: Neurovascular status intact b/l feet.  Muscle strength 5/5 to all muscle groups b/l LE  Porokeratotic lesions submetatarsal head 5 left foot and submet head 2 left foot  Assessment: Painful porokeratoses submet head 5 right foot, submet head 2 left foot  Plan: Painful porokeratosis submet head 5 right foot, submet head 2 left foot Continue soft, supportive shoe  gear daily. Report any pedal injuries to medical professional Follow up prn. Call our office should there be any questions/concerns in the interim.

## 2019-01-17 DIAGNOSIS — H353211 Exudative age-related macular degeneration, right eye, with active choroidal neovascularization: Secondary | ICD-10-CM | POA: Diagnosis not present

## 2019-02-15 ENCOUNTER — Encounter: Payer: Self-pay | Admitting: Internal Medicine

## 2019-02-15 ENCOUNTER — Emergency Department (HOSPITAL_BASED_OUTPATIENT_CLINIC_OR_DEPARTMENT_OTHER)
Admission: EM | Admit: 2019-02-15 | Discharge: 2019-02-15 | Disposition: A | Discharge status: Home / Self Care | Payer: Medicare HMO | Attending: Emergency Medicine | Admitting: Emergency Medicine

## 2019-02-15 ENCOUNTER — Ambulatory Visit (INDEPENDENT_AMBULATORY_CARE_PROVIDER_SITE_OTHER): Payer: Medicare HMO | Admitting: Internal Medicine

## 2019-02-15 ENCOUNTER — Encounter (HOSPITAL_BASED_OUTPATIENT_CLINIC_OR_DEPARTMENT_OTHER): Payer: Self-pay | Admitting: *Deleted

## 2019-02-15 ENCOUNTER — Other Ambulatory Visit: Payer: Self-pay

## 2019-02-15 ENCOUNTER — Emergency Department (HOSPITAL_BASED_OUTPATIENT_CLINIC_OR_DEPARTMENT_OTHER): Payer: Medicare HMO

## 2019-02-15 VITALS — BP 144/88 | HR 84 | Temp 98.5°F | Resp 16 | Ht 74.5 in | Wt 207.4 lb

## 2019-02-15 DIAGNOSIS — K5900 Constipation, unspecified: Secondary | ICD-10-CM | POA: Insufficient documentation

## 2019-02-15 DIAGNOSIS — J449 Chronic obstructive pulmonary disease, unspecified: Secondary | ICD-10-CM | POA: Insufficient documentation

## 2019-02-15 DIAGNOSIS — F32A Depression, unspecified: Secondary | ICD-10-CM

## 2019-02-15 DIAGNOSIS — F419 Anxiety disorder, unspecified: Secondary | ICD-10-CM

## 2019-02-15 DIAGNOSIS — R1032 Left lower quadrant pain: Secondary | ICD-10-CM

## 2019-02-15 DIAGNOSIS — F329 Major depressive disorder, single episode, unspecified: Secondary | ICD-10-CM | POA: Diagnosis not present

## 2019-02-15 DIAGNOSIS — Z85048 Personal history of other malignant neoplasm of rectum, rectosigmoid junction, and anus: Secondary | ICD-10-CM | POA: Insufficient documentation

## 2019-02-15 DIAGNOSIS — Z79899 Other long term (current) drug therapy: Secondary | ICD-10-CM | POA: Diagnosis not present

## 2019-02-15 DIAGNOSIS — Z87891 Personal history of nicotine dependence: Secondary | ICD-10-CM | POA: Diagnosis not present

## 2019-02-15 DIAGNOSIS — R1031 Right lower quadrant pain: Secondary | ICD-10-CM | POA: Diagnosis present

## 2019-02-15 DIAGNOSIS — K5732 Diverticulitis of large intestine without perforation or abscess without bleeding: Secondary | ICD-10-CM | POA: Diagnosis not present

## 2019-02-15 DIAGNOSIS — I10 Essential (primary) hypertension: Secondary | ICD-10-CM | POA: Diagnosis not present

## 2019-02-15 DIAGNOSIS — K5792 Diverticulitis of intestine, part unspecified, without perforation or abscess without bleeding: Secondary | ICD-10-CM | POA: Diagnosis not present

## 2019-02-15 DIAGNOSIS — R161 Splenomegaly, not elsewhere classified: Secondary | ICD-10-CM | POA: Diagnosis not present

## 2019-02-15 LAB — COMPREHENSIVE METABOLIC PANEL
ALT: 15 U/L (ref 0–44)
AST: 18 U/L (ref 15–41)
Albumin: 4 g/dL (ref 3.5–5.0)
Alkaline Phosphatase: 52 U/L (ref 38–126)
Anion gap: 8 (ref 5–15)
BUN: 15 mg/dL (ref 8–23)
CO2: 23 mmol/L (ref 22–32)
Calcium: 9.1 mg/dL (ref 8.9–10.3)
Chloride: 107 mmol/L (ref 98–111)
Creatinine, Ser: 0.64 mg/dL (ref 0.61–1.24)
GFR calc Af Amer: >60 mL/min (ref >60)
GFR calc non Af Amer: >60 mL/min (ref >60)
Glucose, Bld: 93 mg/dL (ref 70–99)
Potassium: 3.6 mmol/L (ref 3.5–5.1)
Sodium: 138 mmol/L (ref 135–145)
Total Bilirubin: 0.9 mg/dL (ref 0.3–1.2)
Total Protein: 6.8 g/dL (ref 6.5–8.1)

## 2019-02-15 LAB — CBC WITH DIFFERENTIAL/PLATELET
Abs Immature Granulocytes: 0.03 10*3/uL (ref 0.00–0.07)
Basophils Absolute: 0 10*3/uL (ref 0.0–0.1)
Basophils Relative: 0 %
Eosinophils Absolute: 0.2 10*3/uL (ref 0.0–0.5)
Eosinophils Relative: 2 %
HCT: 43.5 % (ref 39.0–52.0)
Hemoglobin: 14.1 g/dL (ref 13.0–17.0)
Immature Granulocytes: 0 %
Lymphocytes Relative: 29 %
Lymphs Abs: 2.1 10*3/uL (ref 0.7–4.0)
MCH: 30.1 pg (ref 26.0–34.0)
MCHC: 32.4 g/dL (ref 30.0–36.0)
MCV: 92.8 fL (ref 80.0–100.0)
Monocytes Absolute: 0.6 10*3/uL (ref 0.1–1.0)
Monocytes Relative: 9 %
Neutro Abs: 4.2 10*3/uL (ref 1.7–7.7)
Neutrophils Relative %: 60 %
Platelets: 154 10*3/uL (ref 150–400)
RBC: 4.69 MIL/uL (ref 4.22–5.81)
RDW: 12.5 % (ref 11.5–15.5)
WBC: 7.1 10*3/uL (ref 4.0–10.5)
nRBC: 0 % (ref 0.0–0.2)

## 2019-02-15 LAB — POC URINALSYSI DIPSTICK (AUTOMATED)
Bilirubin, UA: NEGATIVE
Blood, UA: NEGATIVE
Glucose, UA: NEGATIVE
Leukocytes, UA: NEGATIVE
Nitrite, UA: NEGATIVE
Protein, UA: NEGATIVE
Spec Grav, UA: 1.025 (ref 1.010–1.025)
Urobilinogen, UA: 0.2 E.U./dL
pH, UA: 6 (ref 5.0–8.0)

## 2019-02-15 LAB — LIPASE, BLOOD: Lipase: 46 U/L (ref 11–51)

## 2019-02-15 MED ORDER — HYDROCODONE-ACETAMINOPHEN 5-325 MG PO TABS: 1.0000 | ORAL_TABLET | ORAL | 0 refills | Status: DC | PRN

## 2019-02-15 MED ORDER — POLYETHYLENE GLYCOL 3350 17 G PO PACK: 17.0000 g | PACK | Freq: Every day | ORAL | 0 refills | Status: DC

## 2019-02-15 MED ORDER — SODIUM CHLORIDE 0.9 % IV BOLUS
1000.0000 mL | Freq: Once | INTRAVENOUS | Status: AC
  Administered 2019-02-15: 1000 mL via INTRAVENOUS

## 2019-02-15 MED ORDER — MORPHINE SULFATE (PF) 4 MG/ML IV SOLN
4.0000 mg | Freq: Once | INTRAVENOUS | Status: AC
  Administered 2019-02-15: 16:00:00 4 mg via INTRAVENOUS
  Filled 2019-02-15: qty 1

## 2019-02-15 MED ORDER — IOHEXOL 300 MG/ML  SOLN
100.0000 mL | Freq: Once | INTRAMUSCULAR | Status: AC | PRN
  Administered 2019-02-15: 16:00:00 100 mL via INTRAVENOUS

## 2019-02-15 MED ORDER — CIPROFLOXACIN HCL 500 MG PO TABS
500.0000 mg | ORAL_TABLET | Freq: Once | ORAL | Status: AC
  Administered 2019-02-15: 17:00:00 500 mg via ORAL
  Filled 2019-02-15: qty 1

## 2019-02-15 MED ORDER — CIPROFLOXACIN HCL 500 MG PO TABS: 500.0000 mg | ORAL_TABLET | Freq: Two times a day (BID) | ORAL | 0 refills | Status: DC

## 2019-02-15 MED ORDER — METRONIDAZOLE 500 MG PO TABS: 500.0000 mg | ORAL_TABLET | Freq: Two times a day (BID) | ORAL | 0 refills | Status: DC

## 2019-02-15 MED ORDER — ONDANSETRON 4 MG PO TBDP: 4.0000 mg | ORAL_TABLET | Freq: Three times a day (TID) | ORAL | 0 refills | Status: DC | PRN

## 2019-02-15 MED ORDER — METRONIDAZOLE 500 MG PO TABS
500.0000 mg | ORAL_TABLET | Freq: Once | ORAL | Status: AC
  Administered 2019-02-15: 500 mg via ORAL
  Filled 2019-02-15: qty 1

## 2019-02-15 MED ORDER — ONDANSETRON HCL 4 MG/2ML IJ SOLN
4.0000 mg | Freq: Once | INTRAMUSCULAR | Status: AC
  Administered 2019-02-15: 4 mg via INTRAVENOUS
  Filled 2019-02-15: qty 2

## 2019-02-15 NOTE — ED Notes (Signed)
Pt. Does not complain of nausea or vomiting.

## 2019-02-15 NOTE — ED Provider Notes (Signed)
[facility] EMERGENCY DEPARTMENT Provider Note   CSN: [id number] Arrival date & time: 02/15/19  1415    History   Chief Complaint Chief Complaint  Patient presents with  . Abdominal Pain    HPI Devin [name] is a 78 y.o. male.     Pt presents to the ED today with abdominal pain.  The pt said pain has been going on for 3 days.  The pt said he has not had a fever on n/v.  The saw his pcp who sent him here to be eval for diverticulitis.  Pt said both right and left lower quadrants hurt.  Pt denies sob or cp.  No cough.     Past Medical History:  Diagnosis Date  . Arthritis   . Cataract   . COPD (chronic obstructive pulmonary disease) ([place])   . Diverticulosis    pt states he had diverticuli in his bladder also.  . H/O [facility] spotted fever 01/27/2014  . Hematuria    over 5 years ago  . Hernia of abdominal cavity   . History of rectal polyps   . Hyperplastic colon polyp   . Hypertension   . Macular degeneration   . Rectal cancer ([place]) 1994  . Shoulder pain, left    per pt, he can lay on the left side!  . Tremor 10/21/2018  . Tubular adenoma    01/05/2013    Patient Active Problem List   Diagnosis Date Noted  . Tremor 10/21/2018  . Erectile dysfunction 03/08/2018  . Greater trochanteric bursitis of both hips 12/11/2017  . Back pain   . Compression fracture of L2 ([place]) 06/07/2017  . Insomnia 10/14/2016  . DJD (degenerative joint disease) 10/14/2016  . PCP NOTES >>>>>>>>>>>>>>>>>>>>>>>>> 11/28/2015  . Benign prostatic hyperplasia with urinary obstruction 05/30/2015  . Annual physical exam 11/27/2014  . Essential hypertension 10/02/2014  . Alcohol use disorder, mild, abuse 10/02/2014  . Bruit 08/09/2014  . GERD (gastroesophageal reflux disease) 05/25/2014  . History of rectal cancer 05/15/2014  . Personal history of colonic polyps 05/15/2014  . Diverticulosis of colon (without mention of hemorrhage) 05/15/2014  . Family history of malignant  neoplasm of gastrointestinal tract 05/15/2014  . Anxiety and depression 04/19/2014    Past Surgical History:  Procedure Laterality Date  . APPENDECTOMY    . COLON SURGERY     rectal cancer ~ 1999  . EYE SURGERY     cataract sx  . HERNIA REPAIR    . IR KYPHO LUMBAR INC FX REDUCE BONE BX UNI/BIL CANNULATION INC/IMAGING  06/12/2017  . IR RADIOLOGIST EVAL & MGMT  08/06/2017  . mastoid gland removal     left ear  . PROSTATE SURGERY  05/2015  . TONSILLECTOMY          Home Medications    Prior to Admission medications   Medication Sig Start Date End Date Taking? Authorizing Provider  alendronate (FOSAMAX) 70 MG tablet Take 1 tablet (70 mg total) by mouth once a week. Take with a full glass of water on an empty stomach. Patient not taking: Reported on 02/15/2019 10/26/18   Colon Branch, MD  carvedilol (COREG) 6.25 MG tablet Take 1 tablet (6.25 mg total) by mouth 2 (two) times daily with a meal. 10/26/18   Colon Branch, MD  ciprofloxacin (CIPRO) 500 MG tablet Take 1 tablet (500 mg total) by mouth 2 (two) times daily. 02/15/19   Isla Pence, MD  clonazePAM (KLONOPIN) 1 MG tablet Take  0.5 tablets (0.5 mg total) by mouth 2 (two) times daily as needed for anxiety. 10/26/18   Colon Branch, MD  clotrimazole-betamethasone (LOTRISONE) cream Apply 1 application topically 2 (two) times daily. Patient not taking: Reported on 02/15/2019 06/09/18   Colon Branch, MD  HYDROcodone-acetaminophen (NORCO/VICODIN) 5-325 MG tablet Take 1 tablet by mouth every 4 (four) hours as needed. 02/15/19   Isla Pence, MD  metroNIDAZOLE (FLAGYL) 500 MG tablet Take 1 tablet (500 mg total) by mouth 2 (two) times daily. 02/15/19   Isla Pence, MD  nortriptyline (PAMELOR) 50 MG capsule Take 1 capsule (50 mg total) by mouth at bedtime. 10/26/18   Colon Branch, MD  ondansetron (ZOFRAN ODT) 4 MG disintegrating tablet Take 1 tablet (4 mg total) by mouth every 8 (eight) hours as needed. 02/15/19   Isla Pence, MD  polyethylene  glycol (MIRALAX) 17 g packet Take 17 g by mouth daily. 02/15/19   Isla Pence, MD    Family History Family History  Problem Relation Age of Onset  . Breast cancer Mother   . Colon cancer Mother 46       died 98  . Aneurysm Father   . Heart attack Father        52  . Aneurysm Son   . Stroke Paternal Grandfather   . Cancer Paternal Grandmother   . Cancer Maternal Grandmother   . Prostate cancer Neg Hx   . Esophageal cancer Neg Hx   . Pancreatic cancer Neg Hx   . Rectal cancer Neg Hx   . Stomach cancer Neg Hx     Social History Social History   Tobacco Use  . Smoking status: Former Smoker    Last attempt to quit: 04/19/1989    Years since quitting: 29.8  . Smokeless tobacco: Never Used  Substance Use Topics  . Alcohol use: Yes    Alcohol/week: 14.0 standard drinks    Types: 14 Glasses of wine per week    Comment: most days  , amounts varies , sometimes > 2 servings   . Drug use: No     Allergies   Escitalopram   Review of Systems Review of Systems  Gastrointestinal: Positive for abdominal pain.  All other systems reviewed and are negative.    Physical Exam Updated Vital Signs BP (!) 158/96   Pulse 95   Temp 98.3 F (36.8 C) (Oral)   Resp 18   Ht 6' 2.5" (1.892 m)   Wt 94 kg   SpO2 95%   BMI 26.25 kg/m   Physical Exam Vitals signs and nursing note reviewed.  Constitutional:      Appearance: He is well-developed.  HENT:     Head: Normocephalic and atraumatic.     Mouth/Throat:     Mouth: Mucous membranes are moist.     Pharynx: Oropharynx is clear.  Eyes:     Extraocular Movements: Extraocular movements intact.     Pupils: Pupils are equal, round, and reactive to light.  Cardiovascular:     Rate and Rhythm: Normal rate and regular rhythm.  Pulmonary:     Effort: Pulmonary effort is normal.     Breath sounds: Normal breath sounds.  Abdominal:     General: Abdomen is flat.     Palpations: Abdomen is rigid.     Tenderness: There is  abdominal tenderness in the right lower quadrant and left lower quadrant.  Skin:    General: Skin is warm.     Capillary Refill:  Capillary refill takes less than 2 seconds.  Neurological:     General: No focal deficit present.     Mental Status: He is alert and oriented to person, place, and time.  Psychiatric:        Mood and Affect: Mood normal.        Behavior: Behavior normal.      ED Treatments / Results  Labs (all labs ordered are listed, but only abnormal results are displayed) Labs Reviewed  CBC WITH DIFFERENTIAL/PLATELET  COMPREHENSIVE METABOLIC PANEL  LIPASE, BLOOD  URINALYSIS, ROUTINE W REFLEX MICROSCOPIC    EKG None  Radiology Ct Abdomen Pelvis W Contrast  Result Date: 02/15/2019 CLINICAL DATA:  Abdominal pain EXAM: CT ABDOMEN AND PELVIS WITH CONTRAST TECHNIQUE: Multidetector CT imaging of the abdomen and pelvis was performed using the standard protocol following bolus administration of intravenous contrast. CONTRAST:  126mL OMNIPAQUE IOHEXOL 300 MG/ML  SOLN COMPARISON:  CT of the pelvis dated 04/24/2016. FINDINGS: Lower chest: There is a small amount of scarring or atelectasis at the lung bases bilaterally. Hepatobiliary: No focal liver abnormality is seen. No gallstones, gallbladder wall thickening, or biliary dilatation. Pancreas: Unremarkable. No pancreatic ductal dilatation or surrounding inflammatory changes. Spleen: The spleen is enlarged measuring 13.7 cm craniocaudad. Adrenals/Urinary Tract: Adrenal glands are unremarkable. Kidneys are normal, without renal calculi, focal lesion, or hydronephrosis. There is a focal dilatation of the distal left ureter with a possible small filling defect near the left UVJ (axial series 2, image 73). This is stable from prior study and is likely benign. Bladder is unremarkable. Stomach/Bowel: There is uncomplicated sigmoid diverticulitis. There is a large amount of formed stool throughout the colon. No evidence of an abscess. There  is no small bowel obstruction. Vascular/Lymphatic: Aortic atherosclerosis. No enlarged abdominal or pelvic lymph nodes. Reproductive: Prostate is unremarkable. Other: No abdominal wall hernia or abnormality. No abdominopelvic ascites. Musculoskeletal: The patient is status post prior vertebral augmentation of the L2 vertebral body. There is a chronic compression fracture of the L2 vertebral body. There is severe disc height loss at the L5-S1 level. There is no acute osseous abnormality. IMPRESSION: 1. Uncomplicated sigmoid diverticulitis. An underlying mass cannot be excluded. Follow-up with a nonemergent outpatient colonoscopy is recommended. 2. Large amount of stool throughout the colon. Correlate for constipation. 3. Splenomegaly. Aortic Atherosclerosis (ICD10-I70.0). Electronically Signed   By: Constance Holster M.D.   On: 02/15/2019 16:36    Procedures Procedures (including critical care time)  Medications Ordered in ED Medications  metroNIDAZOLE (FLAGYL) tablet 500 mg (has no administration in time range)  ciprofloxacin (CIPRO) tablet 500 mg (has no administration in time range)  ondansetron (ZOFRAN) injection 4 mg (4 mg Intravenous Given 02/15/19 1540)  morphine 4 MG/ML injection 4 mg (4 mg Intravenous Given 02/15/19 1542)  sodium chloride 0.9 % bolus 1,000 mL (1,000 mLs Intravenous New Bag/Given 02/15/19 1537)  iohexol (OMNIPAQUE) 300 MG/ML solution 100 mL (100 mLs Intravenous Contrast Given 02/15/19 1605)     Initial Impression / Assessment and Plan / ED Course  I have reviewed the triage vital signs and the nursing notes.  Pertinent labs & imaging results that were available during my care of the patient were reviewed by me and considered in my medical decision making (see chart for details).   CT scans and labs reviewed.    Pt's pain is gone.  He does have diverticulitis.  He is told that he needs to see GI and to possibly get a colonoscopy after pain has  resolved.  Pt is encouraged  to eat a high fiber diet.  Return if worse.  Final Clinical Impressions(s) / ED Diagnoses   Final diagnoses:  Diverticulitis  Constipation, unspecified constipation type    ED Discharge Orders         Ordered    ciprofloxacin (CIPRO) 500 MG tablet  2 times daily     02/15/19 1646    metroNIDAZOLE (FLAGYL) 500 MG tablet  2 times daily     02/15/19 1646    HYDROcodone-acetaminophen (NORCO/VICODIN) 5-325 MG tablet  Every 4 hours PRN     02/15/19 1646    ondansetron (ZOFRAN ODT) 4 MG disintegrating tablet  Every 8 hours PRN     02/15/19 1646    polyethylene glycol (MIRALAX) 17 g packet  Daily     02/15/19 1646           Isla Pence, MD 02/15/19 1648

## 2019-02-15 NOTE — Patient Instructions (Signed)
Please go to the ER at the first floor for further evaluation  Schedule a ER follow-up with me in the next few days

## 2019-02-15 NOTE — Progress Notes (Signed)
Subjective:    Patient ID: Devin [name], male    DOB: [birth year], 78 y.o.   MRN: [id number]  DOS:  02/15/2019 Type of visit - description: Acute visit Symptoms started 3 days ago with bilateral lower abdominal pain.  When he is active doing his yard work he feels okay, the pain service when his at rest. Pain also increases after eating (?) Not taking any specific medication for his symptoms except for stool softener  He recently separated from his wife, he is eating having have to change, not eating healthy.  He thinks that the problem is related to constipation.  Review of Systems Denies fever chills Appetite is good No nausea, vomiting, no blood in the stools. Typically he has BMs every 3 days, last BM was 5 days ago. No cough, no LUTS. No S/H ideas   Past Medical History:  Diagnosis Date  . Arthritis   . Cataract   . COPD (chronic obstructive pulmonary disease) ([place])   . Diverticulosis    pt states he had diverticuli in his bladder also.  . H/O [facility] spotted fever 01/27/2014  . Hematuria    over 5 years ago  . Hernia of abdominal cavity   . History of rectal polyps   . Hyperplastic colon polyp   . Hypertension   . Macular degeneration   . Rectal cancer ([place]) 1994  . Shoulder pain, left    per pt, he can lay on the left side!  . Tremor 10/21/2018  . Tubular adenoma    01/05/2013    Past Surgical History:  Procedure Laterality Date  . APPENDECTOMY    . COLON SURGERY     rectal cancer ~ 1999  . EYE SURGERY     cataract sx  . HERNIA REPAIR    . IR KYPHO LUMBAR INC FX REDUCE BONE BX UNI/BIL CANNULATION INC/IMAGING  06/12/2017  . IR RADIOLOGIST EVAL & MGMT  08/06/2017  . mastoid gland removal     left ear  . PROSTATE SURGERY  05/2015  . TONSILLECTOMY      Social History   Socioeconomic History  . Marital status: Married    Spouse name: Denice Paradise  . Number of children: 2  . Years of education: Not on file  . Highest education level: Not on file   Occupational History  . Occupation: RetiredMultimedia programmer  . Financial resource strain: Not on file  . Food insecurity:    Worry: Not on file    Inability: Not on file  . Transportation needs:    Medical: Not on file    Non-medical: Not on file  Tobacco Use  . Smoking status: Former Smoker    Last attempt to quit: 04/19/1989    Years since quitting: 29.8  . Smokeless tobacco: Never Used  Substance and Sexual Activity  . Alcohol use: Yes    Alcohol/week: 14.0 standard drinks    Types: 14 Glasses of wine per week    Comment: most days  , amounts varies , sometimes > 2 servings   . Drug use: No  . Sexual activity: Not Currently  Lifestyle  . Physical activity:    Days per week: Not on file    Minutes per session: Not on file  . Stress: Not on file  Relationships  . Social connections:    Talks on phone: Not on file    Gets together: Not on file    Attends religious service: Not on file  Active member of club or organization: Not on file    Attends meetings of clubs or organizations: Not on file    Relationship status: Not on file  . Intimate partner violence:    Fear of current or ex partner: Not on file    Emotionally abused: Not on file    Physically abused: Not on file    Forced sexual activity: Not on file  Other Topics Concern  . Not on file  Social History Narrative   Household-- pt, wife Denice Paradise)   [place] from [place] ~ 2010/01/17   Youngest son lives in [place] reason why he moved here        Separated from wife 09/2018, thinking about going back to [place], this is her second marriage.   She lost her first husband.   Aaron Edelman adopted their son  [name] who died in 01-17-2010, he was an alcoholic.   They have together  one biological son.  He lives in [place].      Right handed   Occasional caffeine usage        Allergies as of 02/15/2019      Reactions   Escitalopram Other (See Comments)   "made me feel drunk"      Medication List        Accurate as of Feb 15, 2019  1:55 PM. If you have any questions, ask your nurse or doctor.        alendronate 70 MG tablet Commonly known as:  FOSAMAX Take 1 tablet (70 mg total) by mouth once a week. Take with a full glass of water on an empty stomach.   carvedilol 6.25 MG tablet Commonly known as:  COREG Take 1 tablet (6.25 mg total) by mouth 2 (two) times daily with a meal.   clonazePAM 1 MG tablet Commonly known as:  KLONOPIN Take 0.5 tablets (0.5 mg total) by mouth 2 (two) times daily as needed for anxiety.   clotrimazole-betamethasone cream Commonly known as:  LOTRISONE Apply 1 application topically 2 (two) times daily.   CURCUMAX PRO PO Take 1,650 mg by mouth daily.   NONFORMULARY OR COMPOUNDED ITEM   nortriptyline 50 MG capsule Commonly known as:  PAMELOR Take 1 capsule (50 mg total) by mouth at bedtime.   omeprazole 20 MG capsule Commonly known as:  PRILOSEC Take 1 capsule (20 mg total) by mouth daily.           Objective:   Physical Exam Abdominal:      BP (!) 144/88 (BP Location: Left Arm, Patient Position: Sitting, Cuff Size: Small)   Pulse 84   Temp 98.5 F (36.9 C) (Oral)   Resp 16   Ht 6' 2.5" (1.892 m)   Wt 207 lb 6 oz (94.1 kg)   SpO2 96%   BMI 26.27 kg/m  General:   Well developed, NAD, BMI noted.  HEENT:  Normocephalic . Face symmetric, atraumatic Lungs:  CTA B Normal respiratory effort, no intercostal retractions, no accessory muscle use. Heart: RRR,  no murmur.  no pretibial edema bilaterally  Abdomen:  Not distended, soft, no mass, no rebound but + tenderness.  See graphic Skin: Not pale. Not jaundice Neurologic:  alert & oriented X3.  Speech normal, gait appropriate for age and unassisted Psych--  Cognition and judgment appear intact.  Cooperative with normal attention span and concentration.  Behavior appropriate. No anxious or depressed appearing.     Assessment  Assessment HTN Depression-insomnia, lost a  son ~2011 GERD sx onset ~2016 MSK: --DJD- hip injection 2017  --L2 vertebral fracture d/t a fall , kyphoplasty 05-2017 --T score 06-2017 -0.2, was rx ca and vit D.  Started  Fosamax 09-2017 BPH --s/p surgery x2 2016 , Dr Hazle Nordmann H/o etoh, mild  Rectal cancer-- s/p surgery ~ 1999, no chemo-XRT FH CAD, F MI age 83 Former light smoker , No symptoms  PLAN Lower abdominal pain: As described above, history of appendectomy, rectal cancer many years ago status post surgery, prostate surgery, last colonoscopy 2018: Diverticuli and a solitary ulcer. Udip negative Although there is no acute abdomen I am concerned about diverticulitis since the pain to palpation is certainly worse on the left,: Will recommend ER for further evaluation. Anxiety, depression, insomnia: Separated from his wife, no suicidal or homicidal ideas, thinking about going back to [place] where he is from. Advised to schedule a routine visit after ER eval.  Today, I spent more than  25  min with the patient: >50% of the time counseling regards his acute abdominal pain, taking a detailed history, explaining why I think he has possibly diverticulitis & coordinating his care

## 2019-02-15 NOTE — ED Triage Notes (Signed)
Abdominal pain x 3 days. He was seen by his MD today and diagnosed with diverticulitis. He was told to come here for treatment.

## 2019-02-15 NOTE — Assessment & Plan Note (Addendum)
Lower abdominal pain: As described above, history of appendectomy, rectal cancer many years ago status post surgery, prostate surgery, last colonoscopy 2018: Diverticuli and a solitary ulcer. Udip negative Although there is no acute abdomen I am concerned about diverticulitis since the pain to palpation is certainly worse on the left,: Will recommend ER for further evaluation. Anxiety, depression, insomnia: Separated from his wife, no suicidal or homicidal ideas, thinking about going back to [place] where he is from. Advised to schedule a routine visit after ER eval.

## 2019-02-17 ENCOUNTER — Encounter: Payer: Self-pay | Admitting: Internal Medicine

## 2019-02-19 ENCOUNTER — Encounter: Payer: Self-pay | Admitting: Internal Medicine

## 2019-02-23 ENCOUNTER — Telehealth: Payer: Self-pay | Admitting: Gastroenterology

## 2019-02-23 NOTE — Telephone Encounter (Signed)
I rec'd a message from the ED that this patient was seen last week for diverticulitis. They recommended follow up with me.  Please contact him today and ask if he can come to the office the day after tomorrow for an in-person visit.

## 2019-02-23 NOTE — Telephone Encounter (Signed)
Left message to return call 

## 2019-02-23 NOTE — Telephone Encounter (Signed)
Left a message to return call.  

## 2019-02-28 ENCOUNTER — Encounter: Payer: Self-pay | Admitting: Internal Medicine

## 2019-03-01 ENCOUNTER — Encounter: Payer: Self-pay | Admitting: Internal Medicine

## 2019-03-01 ENCOUNTER — Ambulatory Visit (INDEPENDENT_AMBULATORY_CARE_PROVIDER_SITE_OTHER): Payer: Medicare HMO | Admitting: Internal Medicine

## 2019-03-01 ENCOUNTER — Other Ambulatory Visit: Payer: Self-pay

## 2019-03-01 VITALS — BP 120/72 | HR 72 | Temp 98.0°F | Ht 74.0 in | Wt 201.5 lb

## 2019-03-01 DIAGNOSIS — Z0000 Encounter for general adult medical examination without abnormal findings: Secondary | ICD-10-CM

## 2019-03-01 MED ORDER — CLONAZEPAM 1 MG PO TABS: 0.5000 mg | ORAL_TABLET | Freq: Two times a day (BID) | ORAL | 1 refills | Status: DC | PRN

## 2019-03-01 NOTE — Progress Notes (Signed)
Subjective:    Patient ID: Devin [name], male    DOB: 1941-03-03, 78 y.o.   MRN: [id number]  DOS:  03/01/2019 Type of visit - description: Here for CPX Was recently seen at the ER with diverticulitis, symptoms resolved. Hernia, left abdomen?  about a week ago noted the area is slightly bulge.  No tenderness,    Review of Systems  Other than above, a 14 point review of systems is negative     Past Medical History:  Diagnosis Date  . Arthritis   . Cataract   . COPD (chronic obstructive pulmonary disease) ([place])   . Diverticulosis    pt states he had diverticuli in his bladder also.  . H/O [facility] spotted fever 01/27/2014  . Hematuria    over 5 years ago  . Hernia of abdominal cavity   . History of rectal polyps   . Hyperplastic colon polyp   . Hypertension   . Macular degeneration   . Rectal cancer ([place]) 1994  . Shoulder pain, left    per pt, he can lay on the left side!  . Tremor 10/21/2018  . Tubular adenoma    01/05/2013    Past Surgical History:  Procedure Laterality Date  . APPENDECTOMY    . COLON SURGERY     rectal cancer ~ 1999  . EYE SURGERY     cataract sx  . HERNIA REPAIR    . IR KYPHO LUMBAR INC FX REDUCE BONE BX UNI/BIL CANNULATION INC/IMAGING  06/12/2017  . IR RADIOLOGIST EVAL & MGMT  08/06/2017  . mastoid gland removal     left ear  . PROSTATE SURGERY  05/2015  . TONSILLECTOMY      Social History   Socioeconomic History  . Marital status: Married    Spouse name: Denice Paradise  . Number of children: 2  . Years of education: Not on file  . Highest education level: Not on file  Occupational History  . Occupation: RetiredMultimedia programmer  . Financial resource strain: Not on file  . Food insecurity:    Worry: Not on file    Inability: Not on file  . Transportation needs:    Medical: Not on file    Non-medical: Not on file  Tobacco Use  . Smoking status: Former Smoker    Last attempt to quit: 04/19/1989    Years since quitting: 29.8  .  Smokeless tobacco: Never Used  Substance and Sexual Activity  . Alcohol use: Yes    Alcohol/week: 14.0 standard drinks    Types: 14 Glasses of wine per week    Comment: most days  , amounts varies , sometimes > 2 servings   . Drug use: No  . Sexual activity: Not Currently  Lifestyle  . Physical activity:    Days per week: Not on file    Minutes per session: Not on file  . Stress: Not on file  Relationships  . Social connections:    Talks on phone: Not on file    Gets together: Not on file    Attends religious service: Not on file    Active member of club or organization: Not on file    Attends meetings of clubs or organizations: Not on file    Relationship status: Not on file  . Intimate partner violence:    Fear of current or ex partner: Not on file    Emotionally abused: Not on file    Physically abused: Not on  file    Forced sexual activity: Not on file  Other Topics Concern  . Not on file  Social History Narrative   Household-- pt, wife Denice Paradise)   [place] from [place] ~ 2010/01/11   Youngest son lives in [place] reason why he moved here        Separated from wife 09/2018, thinking about going back to [place], this is her second marriage.   She lost her first husband.   Aaron Edelman adopted their son  [name] who died in 2010-01-11, he was an alcoholic.   They have together  one biological son.  He lives in [place].      Right handed   Occasional caffeine usage       Family History  Problem Relation Age of Onset  . Breast cancer Mother   . Colon cancer Mother 71       died 28  . Aneurysm Father   . Heart attack Father        2  . Aneurysm Son   . Stroke Paternal Grandfather   . Cancer Paternal Grandmother   . Cancer Maternal Grandmother   . Prostate cancer Neg Hx   . Esophageal cancer Neg Hx   . Pancreatic cancer Neg Hx   . Rectal cancer Neg Hx   . Stomach cancer Neg Hx      Allergies as of 03/01/2019      Reactions   Escitalopram Other (See Comments)    "made me feel drunk"      Medication List       Accurate as of March 01, 2019  8:55 PM. If you have any questions, ask your nurse or doctor.        STOP taking these medications   ciprofloxacin 500 MG tablet Commonly known as:  CIPRO Stopped by:  Kathlene November, MD   HYDROcodone-acetaminophen 5-325 MG tablet Commonly known as:  NORCO/VICODIN Stopped by:  Kathlene November, MD   metroNIDAZOLE 500 MG tablet Commonly known as:  FLAGYL Stopped by:  Kathlene November, MD   polyethylene glycol 17 g packet Commonly known as:  MiraLax Stopped by:  Kathlene November, MD     TAKE these medications   alendronate 70 MG tablet Commonly known as:  FOSAMAX Take 1 tablet (70 mg total) by mouth once a week. Take with a full glass of water on an empty stomach.   carvedilol 6.25 MG tablet Commonly known as:  COREG Take 1 tablet (6.25 mg total) by mouth 2 (two) times daily with a meal.   clonazePAM 1 MG tablet Commonly known as:  KLONOPIN Take 0.5 tablets (0.5 mg total) by mouth 2 (two) times daily as needed for anxiety.   clotrimazole-betamethasone cream Commonly known as:  LOTRISONE Apply 1 application topically 2 (two) times daily.   nortriptyline 50 MG capsule Commonly known as:  PAMELOR Take 1 capsule (50 mg total) by mouth at bedtime.   ondansetron 4 MG disintegrating tablet Commonly known as:  Zofran ODT Take 1 tablet (4 mg total) by mouth every 8 (eight) hours as needed.           Objective:   Physical Exam Abdominal:      BP 120/72 (BP Location: Left Arm, Patient Position: Sitting, Cuff Size: Normal)   Pulse 72   Temp 98 F (36.7 C) (Oral)   Ht 6\' 2"  (1.88 m)   Wt 201 lb 8 oz (91.4 kg)   SpO2  93%   BMI 25.87 kg/m  General: Well developed, NAD, BMI noted Neck: No  thyromegaly  HEENT:  Normocephalic . Face symmetric, atraumatic Lungs:  CTA B Normal respiratory effort, no intercostal retractions, no accessory muscle use. Heart: RRR,  no murmur.  No pretibial edema bilaterally   Abdomen:  Not distended, soft, non-tender. No rebound or rigidity.   Skin: Exposed areas without rash. Not pale. Not jaundice Neurologic:  alert & oriented X3.  Speech normal, gait appropriate for age and unassisted Strength symmetric and appropriate for age.  Psych: Cognition and judgment appear intact.  Cooperative with normal attention span and concentration.  Behavior appropriate. No anxious or depressed appearing.     Assessment       Assessment HTN Depression-insomnia, lost a son ~2011 GERD sx onset ~2016 MSK: --DJD- hip injection 2017  --L2 vertebral fracture d/t a fall , kyphoplasty 05-2017 --T score 06-2017 -0.2, was rx ca and vit D.  Started  Fosamax 09-2017 BPH --s/p surgery x2 2016 , Dr Hazle Nordmann H/o etoh, mild  Rectal cancer-- s/p surgery ~ 1999, no chemo-XRT FH CAD, F MI age 66 Former light smoker , No symptoms  PLAN DJT:TSVXBL BP, continue carvedilol Depression, insomnia: Continue clonazepam and Pamelor.  Needs RF on clonazepam Diverticulitis: Recently diagnosed, had antibiotics, symptoms quickly resolved, currently asymptomatic Osteoporosis: Reports good compliance with Fosamax, consider a bone density test next year.  Recommend vitamin D Hernia?  See HPI and physical exam, recommend observation of the left abdomen. History of EtOH: Reports drinking in moderation, 3-4 times a week at most Splenomegaly: Seen on CT 02/15/2019, no organomegaly on physical exam.  Consider periodic ultrasound noting  his last CBC was normal Social: Separated from wife, to spend about 4 months in [place] then plans to come back to [place] 6 months

## 2019-03-01 NOTE — Patient Instructions (Signed)
   GO TO THE FRONT DESK Schedule your next appointment  For a follow up in 6 months   Start vit D

## 2019-03-01 NOTE — Assessment & Plan Note (Addendum)
-  Td 1-2  019, pnm 23- 2009 and 09-2017, pnm 13- 2015, zostavax 2009; shingrix 10/2018 @ pharmacy, rec to call and get 2nd dose ; rec a flu shot q year -CCS: Cscope 04-2014, + polyps, cscope again 04-2017, next 2023 per cscope report -- Prostate ca screening: states will call  urology, no records  --Diet and exercise discussed --Labs: Reviewed, normal.

## 2019-03-01 NOTE — Assessment & Plan Note (Signed)
OXB:DZHGDJ BP, continue carvedilol Depression, insomnia: Continue clonazepam and Pamelor.  Needs RF on clonazepam Diverticulitis: Recently diagnosed, had antibiotics, symptoms quickly resolved, currently asymptomatic Osteoporosis: Reports good compliance with Fosamax, consider a bone density test next year.  Recommend vitamin D Hernia?  See HPI and physical exam, recommend observation of the left abdomen. History of EtOH: Reports drinking in moderation, 3-4 times a week at most Splenomegaly: Seen on CT 02/15/2019, no organomegaly on physical exam.  Consider periodic ultrasound noting  his last CBC was normal Social: Separated from wife, to spend about 4 months in [place] then plans to come back to [place] [facility] 6 months

## 2019-03-01 NOTE — Telephone Encounter (Signed)
Pt didn't return call. Tried to reach x 2

## 2019-03-03 NOTE — Telephone Encounter (Signed)
Thank you for trying.  I expect he will contact us if he needs [place].  I reviewed his primary care note from 2 days ago, where it sounds like his diverticulitis symptoms have resolved.

## 2019-03-08 ENCOUNTER — Telehealth: Payer: Self-pay | Admitting: Internal Medicine

## 2019-03-08 NOTE — Telephone Encounter (Signed)
Copied from [place] [id number]. Topic: Quick Communication - Rx Refill/Question >> Mar 08, 2019  4:38 PM Sheran Luz wrote: Medication: clonazePAM (KLONOPIN) 1 MG tablet    Patient states this medication was sent to wrong pharmacy. [facility] that office would have to call for transfer. Patient is requesting it resent to pharmacy below.    [id number] [address], [place] [phone] (Phone) [phone] (Fax)

## 2019-03-09 MED ORDER — CLONAZEPAM 1 MG PO TABS: 0.5000 mg | ORAL_TABLET | Freq: Two times a day (BID) | ORAL | 1 refills | Status: DC | PRN

## 2019-03-09 NOTE — Telephone Encounter (Signed)
Can you resent [facility]epam to [facility] [address]? Was sent to wrong pharmacy. I have Rx pended to go to correct pharmacy.

## 2019-03-09 NOTE — Telephone Encounter (Signed)
Sent!

## 2019-03-14 DIAGNOSIS — H353211 Exudative age-related macular degeneration, right eye, with active choroidal neovascularization: Secondary | ICD-10-CM | POA: Diagnosis not present

## 2019-03-19 ENCOUNTER — Other Ambulatory Visit: Payer: Self-pay | Admitting: Internal Medicine

## 2019-03-19 DIAGNOSIS — G4700 Insomnia, unspecified: Secondary | ICD-10-CM

## 2019-03-23 DIAGNOSIS — N401 Enlarged prostate with lower urinary tract symptoms: Secondary | ICD-10-CM | POA: Diagnosis not present

## 2019-03-23 DIAGNOSIS — R358 Other polyuria: Secondary | ICD-10-CM | POA: Diagnosis not present

## 2019-03-23 DIAGNOSIS — N138 Other obstructive and reflux uropathy: Secondary | ICD-10-CM | POA: Diagnosis not present

## 2019-04-01 ENCOUNTER — Encounter: Payer: Self-pay | Admitting: Family Medicine

## 2019-04-11 ENCOUNTER — Other Ambulatory Visit: Payer: Self-pay

## 2019-04-15 ENCOUNTER — Ambulatory Visit: Payer: Medicare HMO | Admitting: Family Medicine

## 2019-04-20 ENCOUNTER — Other Ambulatory Visit: Payer: Self-pay

## 2019-04-20 ENCOUNTER — Encounter: Payer: Self-pay | Admitting: Family Medicine

## 2019-04-20 ENCOUNTER — Ambulatory Visit (INDEPENDENT_AMBULATORY_CARE_PROVIDER_SITE_OTHER): Payer: Medicare HMO | Admitting: Family Medicine

## 2019-04-20 VITALS — BP 120/82 | HR 73 | Temp 97.8°F | Ht 74.0 in | Wt 202.0 lb

## 2019-04-20 DIAGNOSIS — M25552 Pain in left hip: Secondary | ICD-10-CM

## 2019-04-20 DIAGNOSIS — M25551 Pain in right hip: Secondary | ICD-10-CM | POA: Diagnosis not present

## 2019-04-20 MED ORDER — METHYLPREDNISOLONE ACETATE 40 MG/ML IJ SUSP
40.0000 mg | Freq: Once | INTRAMUSCULAR | Status: AC
  Administered 2019-04-20: 40 mg via INTRA_ARTICULAR

## 2019-04-20 NOTE — Patient Instructions (Signed)
Ice/cold pack over area for 10-15 min twice daily.  OK to take Tylenol 1000 mg (2 extra strength tabs) or 975 mg (3 regular strength tabs) every 6 hours as needed.  Activity as tolerated.  Iliotibial Band Syndrome Rehab It is normal to feel mild stretching, pulling, tightness, or discomfort as you do these exercises, but you should stop right away if you feel sudden pain or your pain gets worse.  Stretching and range of motion exercises These exercises warm up your muscles and joints and improve the movement and flexibility of your hip and pelvis. Exercise A: Quadriceps, prone    1. Lie on your abdomen on a firm surface, such as a bed or padded floor. 2. Bend your left / right knee and hold your ankle. If you cannot reach your ankle or pant leg, loop a belt around your foot and grab the belt instead. 3. Gently pull your heel toward your buttocks. Your knee should not slide out to the side. You should feel a stretch in the front of your thigh and knee. 4. Hold this position for 30 seconds. Repeat 2 times. Complete this stretch 3 times per week. Exercise B: Iliotibial band    1. Lie on your side with your left / right leg in the top position. 2. Bend both of your knees and grab your left / right ankle. Stretch out your bottom arm to help you balance. 3. Slowly bring your top knee back so your thigh goes behind your trunk. 4. Slowly lower your top leg toward the floor until you feel a gentle stretch on the outside of your left / right hip and thigh. If you do not feel a stretch and your knee will not fall farther, place the heel of your other foot on top of your knee and pull your knee down toward the floor with your foot. 5. Hold this position for 30 seconds. Repeat 2 times. Complete this stretch 3 times per week. Strengthening exercises These exercises build strength and endurance in your hip and pelvis. Endurance is the ability to use your muscles for a long time, even after they get  tired. Exercise C: Straight leg raises (hip abductors)     1. Lie on your side with your left / right leg in the top position. Lie so your head, shoulder, knee, and hip line up. You may bend your bottom knee to help you balance. 2. Roll your hips slightly forward so your hips are stacked directly over each other and your left / right knee is facing forward. 3. Tense the muscles in your outer thigh and lift your top leg 4-6 inches (10-15 cm). 4. Hold this position for 3 seconds. Repeat for a total of 10 reps. 5. Slowly return to the starting position. Let your muscles relax completely before doing another repetition. Repeat 2 times. Complete this exercise 3 times per week. Exercise D: Straight leg raises (hip extensors) 1. Lie on your abdomen on your bed or a firm surface. You can put a pillow under your hips if that is more comfortable. 2. Bend your left / right knee so your foot is straight up in the air. 3. Squeeze your buttock muscles and lift your left / right thigh off the bed. Do not let your back arch. 4. Tense this muscle as hard as you can without increasing any knee pain. 5. Hold this position for 2 seconds. Repeat for a total of 10 reps 6. Slowly lower your leg to the  starting position and allow it to relax completely. Repeat 2 times. Complete this exercise 3 times per week. Exercise E: Hip hike 1. Stand sideways on a bottom step. Stand on your left / right leg with your other foot unsupported next to the step. You can hold onto the railing or wall if needed for balance. 2. Keep your knees straight and your torso square. Then, lift your left / right hip up toward the ceiling. 3. Slowly let your left / right hip lower toward the floor, past the starting position. Your foot should get closer to the floor. Do not lean or bend your knees. Repeat 2 times. Complete this exercise 3 times per week.  Document Released: 09/15/2005 Document Revised: 05/20/2016 Document Reviewed: 08/17/2015  Elsevier Interactive Patient Education  [employer].   Let us know if you need anything.

## 2019-04-20 NOTE — Progress Notes (Signed)
Musculoskeletal Exam  Patient: Devin [name] DOB: [birth year]  DOS: 04/20/2019  SUBJECTIVE:  Chief Complaint:   Chief Complaint  Patient presents with  . Hip Pain    Devin [name] is a 77 y.o.  male for evaluation and treatment of b/l hip pain.   Onset:  Chronic issue. No inj or change in activity.  Location: b/l lateral hip Character:  aching and sharp  Progression of issue:  has worsened Associated symptoms: pain with certain movements Treatment: to date has been ice, Tylenol, activity modification. Injections have helped in the past, his last one was 5 mo ago.    Neurovascular symptoms: no Has b/l shoulder pain as well.   ROS: Musculoskeletal/Extremities: +b/l hip pain  Past Medical History:  Diagnosis Date  . Arthritis   . Cataract   . COPD (chronic obstructive pulmonary disease) ([place])   . Diverticulosis    pt states he had diverticuli in his bladder also.  . H/O [facility] spotted fever 01/27/2014  . Hematuria    over 5 years ago  . Hernia of abdominal cavity   . History of rectal polyps   . Hyperplastic colon polyp   . Hypertension   . Macular degeneration   . Rectal cancer ([place]) 1994  . Shoulder pain, left    per pt, he can lay on the left side!  . Tremor 10/21/2018  . Tubular adenoma    01/05/2013    Objective: VITAL SIGNS: BP 120/82 (BP Location: Left Arm, Patient Position: Sitting, Cuff Size: Normal)   Pulse 73   Temp 97.8 F (36.6 C) (Oral)   Ht 6\' 2"  (1.88 m)   Wt 202 lb (91.6 kg)   SpO2 95%   BMI 25.94 kg/m  Constitutional: Well formed, well developed. No acute distress. Cardiovascular: Brisk cap refill Thorax & Lungs: No accessory muscle use Musculoskeletal: bilateral hip.   Normal active range of motion: yes.   Normal passive range of motion: yes Tenderness to palpation: yes Deformity: no Ecchymosis: no Tests positive: none Tests negative: [place], log roll Neurologic: Normal sensory function. No focal deficits noted.   Psychiatric: Normal mood. Age appropriate judgment and insight. Alert & oriented x 3.    Procedure note: Greater trochanteric bursa injection Verbal consent obtained. The area of interest was palpated and demarcated with an otoscope speculum. It was cleaned with an alcohol swab. Freeze spray was used. A 27 g needle was inserted at a perpendicular angle through the area of interested. The plunger was withdrawn to ensure our placement was not in a vessel. 2 mL of 1% lidocaine without epi and 40 mg of Depomedrol was injected. A bandaid was placed. This process was repeated on the contralateral side The patient tolerated the procedure well.  There were no complications noted.   Assessment:  Pain of both hip joints - Plan: PR DRAIN/INJECT LARGE JOINT/BURSA, IT band stretches/exercises. If this does not last at least 3 mo, I would have him see PT.   Plan: Orders as above. F/u prn. The patient voiced understanding and agreement to the plan.   [place], DO 04/20/19  8:55 AM

## 2019-04-20 NOTE — Addendum Note (Signed)
Addended by: Sharon Seller B on: 04/20/2019 09:09 AM   Modules accepted: Orders

## 2019-04-29 ENCOUNTER — Other Ambulatory Visit: Payer: Self-pay

## 2019-04-29 ENCOUNTER — Ambulatory Visit (INDEPENDENT_AMBULATORY_CARE_PROVIDER_SITE_OTHER): Payer: Medicare HMO | Admitting: Family Medicine

## 2019-04-29 ENCOUNTER — Encounter: Payer: Self-pay | Admitting: Family Medicine

## 2019-04-29 ENCOUNTER — Ambulatory Visit: Payer: Medicare HMO | Admitting: Family Medicine

## 2019-04-29 VITALS — BP 120/72 | HR 81 | Temp 97.7°F | Ht 74.5 in | Wt 200.2 lb

## 2019-04-29 DIAGNOSIS — G8929 Other chronic pain: Secondary | ICD-10-CM | POA: Diagnosis not present

## 2019-04-29 DIAGNOSIS — M25512 Pain in left shoulder: Secondary | ICD-10-CM

## 2019-04-29 MED ORDER — METHYLPREDNISOLONE ACETATE 40 MG/ML IJ SUSP
40.0000 mg | Freq: Once | INTRAMUSCULAR | Status: AC
  Administered 2019-04-29: 40 mg via INTRA_ARTICULAR

## 2019-04-29 NOTE — Patient Instructions (Signed)
Ice/cold pack over area for 10-15 min twice daily.  Heat (pad or rice pillow in microwave) over affected area, 10-15 minutes twice daily.   OK to take Tylenol 1000 mg (2 extra strength tabs) or 975 mg (3 regular strength tabs) every 6 hours as needed.  EXERCISES  RANGE OF MOTION (ROM) AND STRETCHING EXERCISES These exercises may help you when beginning to rehabilitate your injury. While completing these exercises, remember:   Restoring tissue flexibility helps normal motion to return to the joints. This allows healthier, less painful movement and activity.  An effective stretch should be held for at least 30 seconds.  A stretch should never be painful. You should only feel a gentle lengthening or release in the stretched tissue.  ROM - Pendulum  Bend at the waist so that your right / left arm falls away from your body. Support yourself with your opposite hand on a solid surface, such as a table or a countertop.  Your right / left arm should be perpendicular to the ground. If it is not perpendicular, you need to lean over farther. Relax the muscles in your right / left arm and shoulder as much as possible.  Gently sway your hips and trunk so they move your right / left arm without any use of your right / left shoulder muscles.  Progress your movements so that your right / left arm moves side to side, then forward and backward, and finally, both clockwise and counterclockwise.  Complete 10-15 repetitions in each direction. Many people use this exercise to relieve discomfort in their shoulder as well as to gain range of motion. Repeat 2 times. Complete this exercise 3 times per week.  STRETCH - Flexion, Standing  Stand with good posture. With an underhand grip on your right / left hand and an overhand grip on the opposite hand, grasp a broomstick or cane so that your hands are a little more than shoulder-width apart.  Keeping your right / left elbow straight and shoulder muscles  relaxed, push the stick with your opposite hand to raise your right / left arm in front of your body and then overhead. Raise your arm until you feel a stretch in your right / left shoulder, but before you have increased shoulder pain.  Try to avoid shrugging your right / left shoulder as your arm rises by keeping your shoulder blade tucked down and toward your mid-back spine. Hold 30 seconds.  Slowly return to the starting position. Repeat 2 times. Complete this exercise 3 times per week.  STRETCH - Internal Rotation  Place your right / left hand behind your back, palm-up.  Throw a towel or belt over your opposite shoulder. Grasp the towel/belt with your right / left hand.  While keeping an upright posture, gently pull up on the towel/belt until you feel a stretch in the front of your right / left shoulder.  Avoid shrugging your right / left shoulder as your arm rises by keeping your shoulder blade tucked down and toward your mid-back spine.  Hold 30. Release the stretch by lowering your opposite hand. Repeat 2 times. Complete this exercise 3 times per week.  STRETCH - External Rotation and Abduction  Stagger your stance through a doorframe. It does not matter which foot is forward.  As instructed by your physician, physical therapist or athletic trainer, place your hands: ? And forearms above your head and on the door frame. ? And forearms at head-height and on the door frame. ? At  elbow-height and on the door frame.  Keeping your head and chest upright and your stomach muscles tight to prevent over-extending your low-back, slowly shift your weight onto your front foot until you feel a stretch across your chest and/or in the front of your shoulders.  Hold 30 seconds. Shift your weight to your back foot to release the stretch. Repeat 2 times. Complete this stretch 3 times per week.   STRENGTHENING EXERCISES  These exercises may help you when beginning to rehabilitate your injury.  They may resolve your symptoms with or without further involvement from your physician, physical therapist or athletic trainer. While completing these exercises, remember:   Muscles can gain both the endurance and the strength needed for everyday activities through controlled exercises.  Complete these exercises as instructed by your physician, physical therapist or athletic trainer. Progress the resistance and repetitions only as guided.  You may experience muscle soreness or fatigue, but the pain or discomfort you are trying to eliminate should never worsen during these exercises. If this pain does worsen, stop and make certain you are following the directions exactly. If the pain is still present after adjustments, discontinue the exercise until you can discuss the trouble with your clinician.  If advised by your physician, during your recovery, avoid activity or exercises which involve actions that place your right / left hand or elbow above your head or behind your back or head. These positions stress the tissues which are trying to heal.  STRENGTH - Scapular Depression and Adduction  With good posture, sit on a firm chair. Supported your arms in front of you with pillows, arm rests or a table top. Have your elbows in line with the sides of your body.  Gently draw your shoulder blades down and toward your mid-back spine. Gradually increase the tension without tensing the muscles along the top of your shoulders and the back of your neck.  Hold for 3 seconds. Slowly release the tension and relax your muscles completely before completing the next repetition.  After you have practiced this exercise, remove the arm support and complete it in standing as well as sitting. Repeat 2 times. Complete this exercise 3 times per week.   STRENGTH - External Rotators  Secure a rubber exercise band/tubing to a fixed object so that it is at the same height as your right / left elbow when you are standing  or sitting on a firm surface.  Stand or sit so that the secured exercise band/tubing is at your side that is not injured.  Bend your elbow 90 degrees. Place a folded towel or small pillow under your right / left arm so that your elbow is a few inches away from your side.  Keeping the tension on the exercise band/tubing, pull it away from your body, as if pivoting on your elbow. Be sure to keep your body steady so that the movement is only coming from your shoulder rotating.  Hold 3 seconds. Release the tension in a controlled manner as you return to the starting position. Repeat 2 times. Complete this exercise 3 times per week.   STRENGTH - Supraspinatus  Stand or sit with good posture. Grasp a 2-3 lb weight or an exercise band/tubing so that your hand is "thumbs-up," like when you shake hands.  Slowly lift your right / left hand from your thigh into the air, traveling about 30 degrees from straight out at your side. Lift your hand to shoulder height or as far as you   can without increasing any shoulder pain. Initially, many people do not lift their hands above shoulder height.  Avoid shrugging your right / left shoulder as your arm rises by keeping your shoulder blade tucked down and toward your mid-back spine.  Hold for 3 seconds. Control the descent of your hand as you slowly return to your starting position. Repeat 2 times. Complete this exercise 3 times per week.   STRENGTH - Shoulder Extensors  Secure a rubber exercise band/tubing so that it is at the height of your shoulders when you are either standing or sitting on a firm arm-less chair.  With a thumbs-up grip, grasp an end of the band/tubing in each hand. Straighten your elbows and lift your hands straight in front of you at shoulder height. Step back away from the secured end of band/tubing until it becomes tense.  Squeezing your shoulder blades together, pull your hands down to the sides of your thighs. Do not allow your hands  to go behind you.  Hold for 3 seconds. Slowly ease the tension on the band/tubing as you reverse the directions and return to the starting position. Repeat 2 times. Complete this exercise 3 times per week.   STRENGTH - Scapular Retractors  Secure a rubber exercise band/tubing so that it is at the height of your shoulders when you are either standing or sitting on a firm arm-less chair.  With a palm-down grip, grasp an end of the band/tubing in each hand. Straighten your elbows and lift your hands straight in front of you at shoulder height. Step back away from the secured end of band/tubing until it becomes tense.  Squeezing your shoulder blades together, draw your elbows back as you bend them. Keep your upper arm lifted away from your body throughout the exercise.  Hold 3 seconds. Slowly ease the tension on the band/tubing as you reverse the directions and return to the starting position. Repeat 2 times. Complete this exercise 3 times per week.  STRENGTH - Scapular Depressors  Find a sturdy chair without wheels, such as a from a dining room table.  Keeping your feet on the floor, lift your bottom from the seat and lock your elbows.  Keeping your elbows straight, allow gravity to pull your body weight down. Your shoulders will rise toward your ears.  Raise your body against gravity by drawing your shoulder blades down your back, shortening the distance between your shoulders and ears. Although your feet should always maintain contact with the floor, your feet should progressively support less body weight as you get stronger.  Hold 3 seconds. In a controlled and slow manner, lower your body weight to begin the next repetition. Repeat 2 times. Complete this exercise 3 times per week.   This information is not intended to replace advice given to you by your health care provider. Make sure you discuss any questions you have with your health care provider.  Document Released: 07/30/2005  Document Revised: 10/06/2014 Document Reviewed: 12/28/2008 Elsevier Interactive Patient Education 2016 [employer]  

## 2019-04-29 NOTE — Progress Notes (Signed)
Chief Complaint  Patient presents with  . Injections    shoulder    Subjective: Patient is a 78 y.o. male here for L shoulder pain.  L shoulder pain for past 10 yrs. Decreased ROM. Injured years ago. Has done very well with bursa injections in past.    ROS: MSK: +L shoulder pain  Past Medical History:  Diagnosis Date  . Arthritis   . Cataract   . COPD (chronic obstructive pulmonary disease) ([place])   . Diverticulosis    pt states he had diverticuli in his bladder also.  . H/O [facility] spotted fever 01/27/2014  . Hematuria    over 5 years ago  . Hernia of abdominal cavity   . History of rectal polyps   . Hyperplastic colon polyp   . Hypertension   . Macular degeneration   . Rectal cancer ([place]) 1994  . Shoulder pain, left    per pt, he can lay on the left side!  . Tremor 10/21/2018  . Tubular adenoma    01/05/2013    Objective: BP 120/72 (BP Location: Left Arm, Patient Position: Sitting, Cuff Size: Normal)   Pulse 81   Temp 97.7 F (36.5 C) (Oral)   Ht 6' 2.5" (1.892 m)   Wt 200 lb 4 oz (90.8 kg)   SpO2 93%   BMI 25.37 kg/m  General: Awake, appears stated age MSK: No ttp, +decreased passive and active ROM. +empty can, Hawkins, Neer's; neg speed's, cross over, liftoff Neuro: DTR's eq and symmetric in UE's b/l, no clonus Lungs: No accessory muscle use Psych: Age appropriate judgment and insight, normal affect and mood  Procedure Note; Shoulder bursa injection Verbal consent obtained. The area was palpated, an area was marked just caudal to the acromion process laterally, and cleaned with alcohol x1. Freeze spray used.  A 27-gauge needle was used to enter the joint laterally with ease. 40 mg of Depomedrol with 2 mL of 1% lidocaine was injected. The patient tolerated the procedure well. There were no complications noted.  Assessment and Plan: Chronic left shoulder pain - Plan: PR DRAIN/INJECT LARGE JOINT/BURSA, methylPREDNISolone acetate (DEPO-MEDROL)  injection 40 mg; stretches/exercises; heat, Tylenol, ice.  F/u prn.  The patient voiced understanding and agreement to the plan.  [place], DO 04/29/19  9:56 AM

## 2019-05-02 DIAGNOSIS — H353211 Exudative age-related macular degeneration, right eye, with active choroidal neovascularization: Secondary | ICD-10-CM | POA: Diagnosis not present

## 2019-05-02 DIAGNOSIS — H35363 Drusen (degenerative) of macula, bilateral: Secondary | ICD-10-CM | POA: Diagnosis not present

## 2019-05-02 DIAGNOSIS — H35453 Secondary pigmentary degeneration, bilateral: Secondary | ICD-10-CM | POA: Diagnosis not present

## 2019-05-02 DIAGNOSIS — H353121 Nonexudative age-related macular degeneration, left eye, early dry stage: Secondary | ICD-10-CM | POA: Diagnosis not present

## 2019-05-02 DIAGNOSIS — Z961 Presence of intraocular lens: Secondary | ICD-10-CM | POA: Diagnosis not present

## 2019-05-05 ENCOUNTER — Ambulatory Visit: Payer: Medicare HMO | Admitting: Neurology

## 2019-05-30 ENCOUNTER — Other Ambulatory Visit: Payer: Self-pay | Admitting: Internal Medicine

## 2019-08-07 ENCOUNTER — Encounter: Payer: Self-pay | Admitting: Internal Medicine

## 2019-08-31 ENCOUNTER — Ambulatory Visit: Payer: Medicare HMO | Admitting: Internal Medicine

## 2019-09-05 ENCOUNTER — Encounter: Payer: Self-pay | Admitting: Internal Medicine

## 2019-10-07 ENCOUNTER — Ambulatory Visit: Payer: Medicare HMO | Admitting: Family Medicine

## 2019-10-28 ENCOUNTER — Ambulatory Visit: Payer: Self-pay | Admitting: *Deleted

## 2020-01-03 ENCOUNTER — Telehealth: Payer: Self-pay

## 2020-01-03 MED ORDER — CARVEDILOL 6.25 MG PO TABS: 6.2500 mg | ORAL_TABLET | Freq: Two times a day (BID) | ORAL | 1 refills | Status: DC

## 2020-01-03 MED ORDER — CLOTRIMAZOLE-BETAMETHASONE 1-0.05 % EX CREA: 1.0000 "application " | TOPICAL_CREAM | Freq: Two times a day (BID) | CUTANEOUS | 0 refills | Status: DC

## 2020-01-03 NOTE — Telephone Encounter (Signed)
Patient called in to see if Dr. Larose Kells can send in a prescription for  carvedilol (COREG) 6.25 MG tablet [id number]   clotrimazole-betamethasone (LOTRISONE) cream [id number]    Please send it to [facility], [address]  [place], [place] [address]  Phone:  [phone] Fax:  [phone]  DEA #:  --

## 2020-01-03 NOTE — Telephone Encounter (Signed)
Rxs sent

## 2020-01-11 ENCOUNTER — Telehealth: Payer: Self-pay | Admitting: Internal Medicine

## 2020-01-11 MED ORDER — CARVEDILOL 6.25 MG PO TABS: 6.2500 mg | ORAL_TABLET | Freq: Two times a day (BID) | ORAL | 0 refills | Status: DC

## 2020-01-11 MED ORDER — CLOTRIMAZOLE-BETAMETHASONE 1-0.05 % EX CREA: 1.0000 "application " | TOPICAL_CREAM | Freq: Two times a day (BID) | CUTANEOUS | 0 refills | Status: DC

## 2020-01-11 NOTE — Telephone Encounter (Signed)
Rxs sent

## 2020-01-11 NOTE — Telephone Encounter (Signed)
Medication: carvedilol (COREG) 6.25 MG tablet [id number]  clotrimazole-betamethasone (LOTRISONE) cream [id number]   Has the patient contacted their pharmacy? Yes.   (If no, request that the patient contact the pharmacy for the refill.) (If yes, when and what did the pharmacy advise?)  Preferred Pharmacy (with phone number or street name):  [id number] [place], [place] - [address] Phone:  [phone]  Fax:  [phone]       Agent: Please be advised that RX refills may take up to 3 business days. We ask that you follow-up with your pharmacy.

## 2020-01-18 ENCOUNTER — Encounter: Payer: Self-pay | Admitting: Internal Medicine

## 2020-01-18 ENCOUNTER — Other Ambulatory Visit: Payer: Self-pay

## 2020-01-18 ENCOUNTER — Ambulatory Visit (INDEPENDENT_AMBULATORY_CARE_PROVIDER_SITE_OTHER): Payer: Medicare HMO | Admitting: Internal Medicine

## 2020-01-18 VITALS — BP 134/77 | HR 92 | Temp 98.1°F | Resp 18 | Ht 74.5 in | Wt 213.4 lb

## 2020-01-18 DIAGNOSIS — I10 Essential (primary) hypertension: Secondary | ICD-10-CM

## 2020-01-18 DIAGNOSIS — M8949 Other hypertrophic osteoarthropathy, multiple sites: Secondary | ICD-10-CM

## 2020-01-18 DIAGNOSIS — F419 Anxiety disorder, unspecified: Secondary | ICD-10-CM | POA: Diagnosis not present

## 2020-01-18 DIAGNOSIS — M810 Age-related osteoporosis without current pathological fracture: Secondary | ICD-10-CM

## 2020-01-18 DIAGNOSIS — N401 Enlarged prostate with lower urinary tract symptoms: Secondary | ICD-10-CM | POA: Diagnosis not present

## 2020-01-18 DIAGNOSIS — R161 Splenomegaly, not elsewhere classified: Secondary | ICD-10-CM

## 2020-01-18 DIAGNOSIS — F329 Major depressive disorder, single episode, unspecified: Secondary | ICD-10-CM | POA: Diagnosis not present

## 2020-01-18 DIAGNOSIS — F32A Depression, unspecified: Secondary | ICD-10-CM

## 2020-01-18 DIAGNOSIS — M159 Polyosteoarthritis, unspecified: Secondary | ICD-10-CM

## 2020-01-18 DIAGNOSIS — N138 Other obstructive and reflux uropathy: Secondary | ICD-10-CM

## 2020-01-18 DIAGNOSIS — M150 Primary generalized (osteo)arthritis: Secondary | ICD-10-CM

## 2020-01-18 MED ORDER — ALENDRONATE SODIUM 70 MG PO TABS: 70.0000 mg | ORAL_TABLET | ORAL | 3 refills | Status: DC

## 2020-01-18 MED ORDER — CARVEDILOL 6.25 MG PO TABS: 6.2500 mg | ORAL_TABLET | Freq: Two times a day (BID) | ORAL | 0 refills | Status: DC

## 2020-01-18 NOTE — Progress Notes (Signed)
Subjective:    Patient ID: Devin [name], male    DOB: 01-04-1941, 79 y.o.   MRN: [id number]  DOS:  01/18/2020 Type of visit - description: rov Today we talk about hypertension, DJD, osteoporosis, BPH, anxiety. Since the last visit, he has been living in [place], has a very healthy lifestyle there and is very relaxed and happy.  DJD: Has pain mostly at the shoulders, back. No hip pain, he walks 6,000 steps daily without problems. Also DJD in his small joints of the hands. No fever chills No headaches No weight loss.    Review of Systems See above   Past Medical History:  Diagnosis Date  . Arthritis   . Cataract   . COPD (chronic obstructive pulmonary disease) ([place])   . Diverticulosis    pt states he had diverticuli in his bladder also.  . H/O [facility] spotted fever 01/27/2014  . Hematuria    over 5 years ago  . Hernia of abdominal cavity   . History of rectal polyps   . Hyperplastic colon polyp   . Hypertension   . Macular degeneration   . Rectal cancer ([place]) 1994  . Shoulder pain, left    per pt, he can lay on the left side!  . Tremor 10/21/2018  . Tubular adenoma    01/05/2013    Past Surgical History:  Procedure Laterality Date  . APPENDECTOMY    . COLON SURGERY     rectal cancer ~ 1999  . EYE SURGERY     cataract sx  . HERNIA REPAIR    . IR KYPHO LUMBAR INC FX REDUCE BONE BX UNI/BIL CANNULATION INC/IMAGING  06/12/2017  . IR RADIOLOGIST EVAL & MGMT  08/06/2017  . mastoid gland removal     left ear  . PROSTATE SURGERY  05/2015  . TONSILLECTOMY      Allergies as of 01/18/2020      Reactions   Escitalopram Other (See Comments)   "made me feel drunk"      Medication List       Accurate as of January 18, 2020  8:36 AM. If you have any questions, ask your nurse or doctor.        STOP taking these medications   clonazePAM 1 MG tablet Commonly known as: KLONOPIN Stopped by: Kathlene November, MD   clotrimazole-betamethasone cream Commonly known as:  LOTRISONE Stopped by: Kathlene November, MD     TAKE these medications   alendronate 70 MG tablet Commonly known as: FOSAMAX Take 1 tablet (70 mg total) by mouth once a week. Take with a full glass of water.   carvedilol 6.25 MG tablet Commonly known as: COREG Take 1 tablet (6.25 mg total) by mouth 2 (two) times daily with a meal.   nortriptyline 50 MG capsule Commonly known as: PAMELOR Take 1 capsule (50 mg total) by mouth at bedtime.   ondansetron 4 MG disintegrating tablet Commonly known as: Zofran ODT Take 1 tablet (4 mg total) by mouth every 8 (eight) hours as needed.          Objective:   Physical Exam BP 134/77 (BP Location: Left Arm, Patient Position: Sitting, Cuff Size: Normal)   Pulse 92   Temp 98.1 F (36.7 C) (Temporal)   Resp 18   Ht 6' 2.5" (1.892 m)   Wt 213 lb 6 oz (96.8 kg)   SpO2 94%   BMI 27.03 kg/m  General:   Well developed, NAD, BMI noted.  HEENT:  Normocephalic . Face symmetric, atraumatic Lungs:  CTA B Normal respiratory effort, no intercostal retractions, no accessory muscle use. Heart: RRR,  no murmur.  Abdomen:  Not distended, soft, non-tender. No rebound or rigidity.  Delete Left side of the LUQ abd is perhaps slightly larger and asymmetric similar to the last visit.  No organomegaly on deep palpation. Skin: Not pale. Not jaundice Lower extremities: no pretibial edema bilaterally  Neurologic:  alert & oriented X3.  Speech normal, gait appropriate for age and unassisted Psych--  Cognition and judgment appear intact.  Cooperative with normal attention span and concentration.  Behavior appropriate. No anxious or depressed appearing.     Assessment    Assessment HTN Depression-insomnia, lost a son ~2011 GERD sx onset ~2016 MSK: --DJD- hip injection 2017  --L2 vertebral fracture d/t a fall , kyphoplasty 05-2017 --T score 06-2017 -0.2, was rx ca and vit D.  Started  Fosamax 09-2017 BPH --s/p surgery x2 2016 , Dr Hazle Nordmann H/o etoh,  mild  Rectal cancer-- s/p surgery ~ 1999, no chemo-XRT FH CAD, F MI age 37 Former light smoker , No symptoms  PLAN HTN: Ran out of carvedilol 2 weeks ago, BP today satisfactory, RF carvedilol, monitor BPs, will consider stop if BP is normal with low-dose of carvedilol.  Check a CMP and FLP. Depression insomnia: Currently doing well, living most of the time in [place] and possibly moving there permanently.  Not needing clonazepam, hardly ever uses Pamelor for insomnia. DJD: Controlling symptoms relatively well with Tylenol.  He is avoiding Celebrex or NSAIDs due to the history of colonic ulcer. Osteoporosis: Check a DEXA, continue Fosamax, calcium and vitamin D. Splenomegaly: Found by CT, normal physical exam, for completeness check and abd [place]. Asymmetric abdomen: See physical exam, other than the slightly asymmetry exam is benign.  Observation. BPH: Refer to urology although he is barely symptomatic. Social: Separated from wife, living most of the time in [place], doing well emotionally RTC 6 months    This visit occurred during the SARS-CoV-2 public health emergency.  Safety protocols were in place, including screening questions prior to the visit, additional usage of staff PPE, and extensive cleaning of exam room while observing appropriate contact time as indicated for disinfecting solutions.

## 2020-01-18 NOTE — Patient Instructions (Addendum)
Take your blood pressure weekly  BP GOAL is between 110/65 and  135/85. If it is consistently higher or lower, let me know     [place], [place] back for   blood work fasting within few days  Come back for for a checkup in 6 months   Go to the first floor and schedule the ultrasound and bone density test

## 2020-01-18 NOTE — Progress Notes (Signed)
Pre visit review using our clinic review tool, if applicable. No additional management support is needed unless otherwise documented below in the visit note. 

## 2020-01-19 DIAGNOSIS — H35453 Secondary pigmentary degeneration, bilateral: Secondary | ICD-10-CM | POA: Diagnosis not present

## 2020-01-19 DIAGNOSIS — H35363 Drusen (degenerative) of macula, bilateral: Secondary | ICD-10-CM | POA: Diagnosis not present

## 2020-01-19 DIAGNOSIS — H353121 Nonexudative age-related macular degeneration, left eye, early dry stage: Secondary | ICD-10-CM | POA: Diagnosis not present

## 2020-01-19 DIAGNOSIS — H353211 Exudative age-related macular degeneration, right eye, with active choroidal neovascularization: Secondary | ICD-10-CM | POA: Diagnosis not present

## 2020-01-19 DIAGNOSIS — Z961 Presence of intraocular lens: Secondary | ICD-10-CM | POA: Diagnosis not present

## 2020-01-19 NOTE — Assessment & Plan Note (Signed)
HTN: Ran out of carvedilol 2 weeks ago, BP today satisfactory, RF carvedilol, monitor BPs, will consider stop if BP is normal with low-dose of carvedilol.  Check a CMP and FLP. Depression insomnia: Currently doing well, living most of the time in [place] and possibly moving there permanently.  Not needing clonazepam, hardly ever uses Pamelor for insomnia. DJD: Controlling symptoms relatively well with Tylenol.  He is avoiding Celebrex or NSAIDs due to the history of colonic ulcer. Osteoporosis: Check a DEXA, continue Fosamax, calcium and vitamin D. Splenomegaly: Found by CT, normal physical exam, for completeness check and abd [place]. Asymmetric abdomen: See physical exam, other than the slightly asymmetry exam is benign.  Observation. BPH: Refer to urology although he is barely symptomatic. Social: Separated from wife, living most of the time in [place], doing well emotionally RTC 6 months

## 2020-01-20 ENCOUNTER — Other Ambulatory Visit (HOSPITAL_BASED_OUTPATIENT_CLINIC_OR_DEPARTMENT_OTHER): Payer: Medicare HMO

## 2020-01-20 ENCOUNTER — Other Ambulatory Visit (INDEPENDENT_AMBULATORY_CARE_PROVIDER_SITE_OTHER): Payer: Medicare HMO

## 2020-01-20 ENCOUNTER — Other Ambulatory Visit: Payer: Self-pay

## 2020-01-20 DIAGNOSIS — I10 Essential (primary) hypertension: Secondary | ICD-10-CM

## 2020-01-20 DIAGNOSIS — R161 Splenomegaly, not elsewhere classified: Secondary | ICD-10-CM

## 2020-01-20 LAB — CBC WITH DIFFERENTIAL/PLATELET
Basophils Absolute: 0 10*3/uL (ref 0.0–0.1)
Basophils Relative: 0.5 % (ref 0.0–3.0)
Eosinophils Absolute: 0.1 10*3/uL (ref 0.0–0.7)
Eosinophils Relative: 1.8 % (ref 0.0–5.0)
HCT: 45.7 % (ref 39.0–52.0)
Hemoglobin: 15.3 g/dL (ref 13.0–17.0)
Lymphocytes Relative: 28.2 % (ref 12.0–46.0)
Lymphs Abs: 1.9 10*3/uL (ref 0.7–4.0)
MCHC: 33.5 g/dL (ref 30.0–36.0)
MCV: 93.4 fl (ref 78.0–100.0)
Monocytes Absolute: 0.5 10*3/uL (ref 0.1–1.0)
Monocytes Relative: 8.1 % (ref 3.0–12.0)
Neutro Abs: 4 10*3/uL (ref 1.4–7.7)
Neutrophils Relative %: 61.4 % (ref 43.0–77.0)
Platelets: 154 10*3/uL (ref 150.0–400.0)
RBC: 4.9 Mil/uL (ref 4.22–5.81)
RDW: 13.1 % (ref 11.5–15.5)
WBC: 6.6 10*3/uL (ref 4.0–10.5)

## 2020-01-20 LAB — COMPREHENSIVE METABOLIC PANEL
ALT: 15 U/L (ref 0–53)
AST: 17 U/L (ref 0–37)
Albumin: 4.6 g/dL (ref 3.5–5.2)
Alkaline Phosphatase: 56 U/L (ref 39–117)
BUN: 12 mg/dL (ref 6–23)
CO2: 30 mEq/L (ref 19–32)
Calcium: 9.8 mg/dL (ref 8.4–10.5)
Chloride: 103 mEq/L (ref 96–112)
Creatinine, Ser: 0.85 mg/dL (ref 0.40–1.50)
GFR: 87.19 mL/min (ref >60.00)
Glucose, Bld: 104 mg/dL — ABNORMAL HIGH (ref 70–99)
Potassium: 4.5 mEq/L (ref 3.5–5.1)
Sodium: 140 mEq/L (ref 135–145)
Total Bilirubin: 0.6 mg/dL (ref 0.2–1.2)
Total Protein: 7.1 g/dL (ref 6.0–8.3)

## 2020-01-20 LAB — LIPID PANEL
Cholesterol: 163 mg/dL (ref 0–200)
HDL: 58.1 mg/dL (ref >39.00)
LDL Cholesterol: 86 mg/dL (ref 0–99)
NonHDL: 105.27
Total CHOL/HDL Ratio: 3
Triglycerides: 94 mg/dL (ref 0.0–149.0)
VLDL: 18.8 mg/dL (ref 0.0–40.0)

## 2020-01-23 ENCOUNTER — Ambulatory Visit (HOSPITAL_BASED_OUTPATIENT_CLINIC_OR_DEPARTMENT_OTHER)
Admission: RE | Admit: 2020-01-23 | Discharge: 2020-01-23 | Disposition: A | Discharge status: Home / Self Care | Payer: Medicare HMO | Source: Ambulatory Visit | Attending: Internal Medicine | Admitting: Internal Medicine

## 2020-01-23 ENCOUNTER — Other Ambulatory Visit: Payer: Self-pay

## 2020-01-23 ENCOUNTER — Ambulatory Visit (HOSPITAL_BASED_OUTPATIENT_CLINIC_OR_DEPARTMENT_OTHER): Payer: Medicare HMO

## 2020-01-23 DIAGNOSIS — M810 Age-related osteoporosis without current pathological fracture: Secondary | ICD-10-CM | POA: Insufficient documentation

## 2020-01-23 DIAGNOSIS — Z13820 Encounter for screening for osteoporosis: Secondary | ICD-10-CM | POA: Diagnosis not present

## 2020-01-23 DIAGNOSIS — R29890 Loss of height: Secondary | ICD-10-CM | POA: Diagnosis not present

## 2020-01-24 ENCOUNTER — Ambulatory Visit (HOSPITAL_BASED_OUTPATIENT_CLINIC_OR_DEPARTMENT_OTHER): Payer: Medicare HMO

## 2020-01-27 ENCOUNTER — Ambulatory Visit (HOSPITAL_BASED_OUTPATIENT_CLINIC_OR_DEPARTMENT_OTHER)
Admission: RE | Admit: 2020-01-27 | Discharge: 2020-01-27 | Disposition: A | Discharge status: Home / Self Care | Payer: Medicare HMO | Source: Ambulatory Visit | Attending: Internal Medicine | Admitting: Internal Medicine

## 2020-01-27 ENCOUNTER — Other Ambulatory Visit: Payer: Self-pay

## 2020-01-27 DIAGNOSIS — R161 Splenomegaly, not elsewhere classified: Secondary | ICD-10-CM | POA: Insufficient documentation

## 2020-01-31 DIAGNOSIS — L820 Inflamed seborrheic keratosis: Secondary | ICD-10-CM | POA: Diagnosis not present

## 2020-01-31 DIAGNOSIS — D0439 Carcinoma in situ of skin of other parts of face: Secondary | ICD-10-CM | POA: Diagnosis not present

## 2020-02-21 DIAGNOSIS — R358 Other polyuria: Secondary | ICD-10-CM | POA: Diagnosis not present

## 2020-02-21 DIAGNOSIS — N138 Other obstructive and reflux uropathy: Secondary | ICD-10-CM | POA: Diagnosis not present

## 2020-02-21 DIAGNOSIS — N401 Enlarged prostate with lower urinary tract symptoms: Secondary | ICD-10-CM | POA: Diagnosis not present

## 2020-02-21 DIAGNOSIS — R339 Retention of urine, unspecified: Secondary | ICD-10-CM | POA: Diagnosis not present

## 2020-02-22 ENCOUNTER — Ambulatory Visit (INDEPENDENT_AMBULATORY_CARE_PROVIDER_SITE_OTHER): Payer: Medicare HMO | Admitting: Family Medicine

## 2020-02-22 ENCOUNTER — Encounter: Payer: Self-pay | Admitting: Family Medicine

## 2020-02-22 ENCOUNTER — Other Ambulatory Visit: Payer: Self-pay

## 2020-02-22 VITALS — BP 122/72 | HR 86 | Temp 96.5°F | Ht 73.0 in | Wt 205.0 lb

## 2020-02-22 DIAGNOSIS — M25511 Pain in right shoulder: Secondary | ICD-10-CM | POA: Diagnosis not present

## 2020-02-22 DIAGNOSIS — G8929 Other chronic pain: Secondary | ICD-10-CM

## 2020-02-22 DIAGNOSIS — M25512 Pain in left shoulder: Secondary | ICD-10-CM

## 2020-02-22 MED ORDER — METHYLPREDNISOLONE ACETATE 40 MG/ML IJ SUSP
40.0000 mg | Freq: Once | INTRAMUSCULAR | Status: AC
  Administered 2020-02-22: 40 mg via INTRA_ARTICULAR

## 2020-02-22 NOTE — Patient Instructions (Addendum)
OK to take Tylenol 1000 mg (2 extra strength tabs) or 975 mg (3 regular strength tabs) every 6 hours as needed.  Heat (pad or rice pillow in microwave) over affected area, 10-15 minutes twice daily.   Ice/cold pack over area for 10-15 min twice daily.  If we don't improve our range of motion, particularly on the left side, I want you to go to physical therapy.   EXERCISES  RANGE OF MOTION (ROM) AND STRETCHING EXERCISES These exercises may help you when beginning to rehabilitate your injury. While completing these exercises, remember:   Restoring tissue flexibility helps normal motion to return to the joints. This allows healthier, less painful movement and activity.  An effective stretch should be held for at least 30 seconds.  A stretch should never be painful. You should only feel a gentle lengthening or release in the stretched tissue.  ROM - Pendulum  Bend at the waist so that your right / left arm falls away from your body. Support yourself with your opposite hand on a solid surface, such as a table or a countertop.  Your right / left arm should be perpendicular to the ground. If it is not perpendicular, you need to lean over farther. Relax the muscles in your right / left arm and shoulder as much as possible.  Gently sway your hips and trunk so they move your right / left arm without any use of your right / left shoulder muscles.  Progress your movements so that your right / left arm moves side to side, then forward and backward, and finally, both clockwise and counterclockwise.  Complete 10-15 repetitions in each direction. Many people use this exercise to relieve discomfort in their shoulder as well as to gain range of motion. Repeat 2 times. Complete this exercise 3 times per week.  STRETCH - Flexion, Standing  Stand with good posture. With an underhand grip on your right / left hand and an overhand grip on the opposite hand, grasp a broomstick or cane so that your hands are  a little more than shoulder-width apart.  Keeping your right / left elbow straight and shoulder muscles relaxed, push the stick with your opposite hand to raise your right / left arm in front of your body and then overhead. Raise your arm until you feel a stretch in your right / left shoulder, but before you have increased shoulder pain.  Try to avoid shrugging your right / left shoulder as your arm rises by keeping your shoulder blade tucked down and toward your mid-back spine. Hold 30 seconds.  Slowly return to the starting position. Repeat 2 times. Complete this exercise 3 times per week.  STRETCH - Internal Rotation  Place your right / left hand behind your back, palm-up.  Throw a towel or belt over your opposite shoulder. Grasp the towel/belt with your right / left hand.  While keeping an upright posture, gently pull up on the towel/belt until you feel a stretch in the front of your right / left shoulder.  Avoid shrugging your right / left shoulder as your arm rises by keeping your shoulder blade tucked down and toward your mid-back spine.  Hold 30. Release the stretch by lowering your opposite hand. Repeat 2 times. Complete this exercise 3 times per week.  STRETCH - External Rotation and Abduction  Stagger your stance through a doorframe. It does not matter which foot is forward.  As instructed by your physician, physical therapist or athletic trainer, place your hands: ?  And forearms above your head and on the door frame. ? And forearms at head-height and on the door frame. ? At elbow-height and on the door frame.  Keeping your head and chest upright and your stomach muscles tight to prevent over-extending your low-back, slowly shift your weight onto your front foot until you feel a stretch across your chest and/or in the front of your shoulders.  Hold 30 seconds. Shift your weight to your back foot to release the stretch. Repeat 2 times. Complete this stretch 3 times per  week.   STRENGTHENING EXERCISES  These exercises may help you when beginning to rehabilitate your injury. They may resolve your symptoms with or without further involvement from your physician, physical therapist or athletic trainer. While completing these exercises, remember:   Muscles can gain both the endurance and the strength needed for everyday activities through controlled exercises.  Complete these exercises as instructed by your physician, physical therapist or athletic trainer. Progress the resistance and repetitions only as guided.  You may experience muscle soreness or fatigue, but the pain or discomfort you are trying to eliminate should never worsen during these exercises. If this pain does worsen, stop and make certain you are following the directions exactly. If the pain is still present after adjustments, discontinue the exercise until you can discuss the trouble with your clinician.  If advised by your physician, during your recovery, avoid activity or exercises which involve actions that place your right / left hand or elbow above your head or behind your back or head. These positions stress the tissues which are trying to heal.  STRENGTH - Scapular Depression and Adduction  With good posture, sit on a firm chair. Supported your arms in front of you with pillows, arm rests or a table top. Have your elbows in line with the sides of your body.  Gently draw your shoulder blades down and toward your mid-back spine. Gradually increase the tension without tensing the muscles along the top of your shoulders and the back of your neck.  Hold for 3 seconds. Slowly release the tension and relax your muscles completely before completing the next repetition.  After you have practiced this exercise, remove the arm support and complete it in standing as well as sitting. Repeat 2 times. Complete this exercise 3 times per week.   STRENGTH - External Rotators  Secure a rubber exercise  band/tubing to a fixed object so that it is at the same height as your right / left elbow when you are standing or sitting on a firm surface.  Stand or sit so that the secured exercise band/tubing is at your side that is not injured.  Bend your elbow 90 degrees. Place a folded towel or small pillow under your right / left arm so that your elbow is a few inches away from your side.  Keeping the tension on the exercise band/tubing, pull it away from your body, as if pivoting on your elbow. Be sure to keep your body steady so that the movement is only coming from your shoulder rotating.  Hold 3 seconds. Release the tension in a controlled manner as you return to the starting position. Repeat 2 times. Complete this exercise 3 times per week.   STRENGTH - Supraspinatus  Stand or sit with good posture. Grasp a 2-3 lb weight or an exercise band/tubing so that your hand is "thumbs-up," like when you shake hands.  Slowly lift your right / left hand from your thigh into the  air, traveling about 30 degrees from straight out at your side. Lift your hand to shoulder height or as far as you can without increasing any shoulder pain. Initially, many people do not lift their hands above shoulder height.  Avoid shrugging your right / left shoulder as your arm rises by keeping your shoulder blade tucked down and toward your mid-back spine.  Hold for 3 seconds. Control the descent of your hand as you slowly return to your starting position. Repeat 2 times. Complete this exercise 3 times per week.   STRENGTH - Shoulder Extensors  Secure a rubber exercise band/tubing so that it is at the height of your shoulders when you are either standing or sitting on a firm arm-less chair.  With a thumbs-up grip, grasp an end of the band/tubing in each hand. Straighten your elbows and lift your hands straight in front of you at shoulder height. Step back away from the secured end of band/tubing until it becomes tense.   Squeezing your shoulder blades together, pull your hands down to the sides of your thighs. Do not allow your hands to go behind you.  Hold for 3 seconds. Slowly ease the tension on the band/tubing as you reverse the directions and return to the starting position. Repeat 2 times. Complete this exercise 3 times per week.   STRENGTH - Scapular Retractors  Secure a rubber exercise band/tubing so that it is at the height of your shoulders when you are either standing or sitting on a firm arm-less chair.  With a palm-down grip, grasp an end of the band/tubing in each hand. Straighten your elbows and lift your hands straight in front of you at shoulder height. Step back away from the secured end of band/tubing until it becomes tense.  Squeezing your shoulder blades together, draw your elbows back as you bend them. Keep your upper arm lifted away from your body throughout the exercise.  Hold 3 seconds. Slowly ease the tension on the band/tubing as you reverse the directions and return to the starting position. Repeat 2 times. Complete this exercise 3 times per week.  STRENGTH - Scapular Depressors  Find a sturdy chair without wheels, such as a from a dining room table.  Keeping your feet on the floor, lift your bottom from the seat and lock your elbows.  Keeping your elbows straight, allow gravity to pull your body weight down. Your shoulders will rise toward your ears.  Raise your body against gravity by drawing your shoulder blades down your back, shortening the distance between your shoulders and ears. Although your feet should always maintain contact with the floor, your feet should progressively support less body weight as you get stronger.  Hold 3 seconds. In a controlled and slow manner, lower your body weight to begin the next repetition. Repeat 2 times. Complete this exercise 3 times per week.   This information is not intended to replace advice given to you by your health care  provider. Make sure you discuss any questions you have with your health care provider.  Document Released: 07/30/2005 Document Revised: 10/06/2014 Document Reviewed: 12/28/2008 Elsevier Interactive Patient Education [employer].

## 2020-02-22 NOTE — Progress Notes (Signed)
Chief Complaint  Patient presents with  . Shoulder Pain    both    Subjective: Patient is a 79 y.o. male here for b/l shoulder pain.  L shoulder pain for years, injected w cortisone 03/2019 that worked well. 1-2 mo started having R pain as well. Decreased ROM on L. No neurologic s/s's.   Past Medical History:  Diagnosis Date  . Arthritis   . Cataract   . COPD (chronic obstructive pulmonary disease) ([place])   . Diverticulosis    pt states he had diverticuli in his bladder also.  . H/O [facility] spotted fever 01/27/2014  . Hematuria    over 5 years ago  . Hernia of abdominal cavity   . History of rectal polyps   . Hyperplastic colon polyp   . Hypertension   . Macular degeneration   . Rectal cancer ([place]) 1994  . Shoulder pain, left    per pt, he can lay on the left side!  . Tremor 10/21/2018  . Tubular adenoma    01/05/2013    Objective: BP 122/72 (BP Location: Left Arm, Patient Position: Sitting, Cuff Size: Normal)   Pulse 86   Temp (!) 96.5 F (35.8 C) (Temporal)   Ht 6\' 1"  (1.854 m)   Wt 205 lb (93 kg)   SpO2 95%   BMI 27.05 kg/m  General: Awake, appears stated age MSK: No ttp b/l, decreased active and passive ROM on L; +Neer's, [place], Empty can b/l; neg Speed's, lift off, cross over b/l Lungs: No accessory muscle use Psych: 79 appropriate judgment and insight, normal affect and mood  Procedure Note; R Shoulder bursa injection Verbal consent obtained. The area was palpated, an area was marked just caudal to the acromion process laterally, and cleaned with alcohol x1. A 27-gauge needle was used to enter the joint laterally with ease. 40 mg of Depomedrol with 2 mL of 1% lidocaine was injected. A bandage was placed.  The patient tolerated the procedure well. There were no complications noted.  Procedure Note; L Shoulder joint injection Informed consent obtained. The area was palpated, an area was marked posterior to the acromion process approximately 2 cm  inferiorly, and cleaned with alcohol x1. Freeze spray was used.  A 27-gauge needle, while aiming towards the coracoid process, was used to enter the joint posteriorally with ease. 40 mg of Depo with 2 mL of 1% lidocaine was injected. A bandage was placed.  The patient tolerated the procedure well. There were no complications noted.   Assessment and Plan: Chronic pain of both shoulders - Plan: PR DRAIN/INJECT LARGE JOINT/BURSA, methylPREDNISolone acetate (DEPO-MEDROL) injection 40 mg, methylPREDNISolone acetate (DEPO-MEDROL) injection 40 mg  Stretches/exercises, injection of bursa on R, jt on L. PT for L shoulder if not improving. I think he is frozen on the L now.  F/u prn with me.  The patient voiced understanding and agreement to the plan.  [place], DO 02/22/20  11:10 AM

## 2020-03-01 DIAGNOSIS — H353211 Exudative age-related macular degeneration, right eye, with active choroidal neovascularization: Secondary | ICD-10-CM | POA: Diagnosis not present

## 2020-03-08 ENCOUNTER — Encounter: Payer: Self-pay | Admitting: Internal Medicine

## 2020-03-08 ENCOUNTER — Other Ambulatory Visit: Payer: Self-pay

## 2020-03-08 ENCOUNTER — Ambulatory Visit (INDEPENDENT_AMBULATORY_CARE_PROVIDER_SITE_OTHER): Payer: Medicare HMO | Admitting: Internal Medicine

## 2020-03-08 VITALS — BP 131/73 | HR 68 | Temp 96.8°F | Resp 18 | Ht 73.0 in | Wt 202.5 lb

## 2020-03-08 DIAGNOSIS — K5900 Constipation, unspecified: Secondary | ICD-10-CM

## 2020-03-08 DIAGNOSIS — J449 Chronic obstructive pulmonary disease, unspecified: Secondary | ICD-10-CM | POA: Diagnosis not present

## 2020-03-08 DIAGNOSIS — R198 Other specified symptoms and signs involving the digestive system and abdomen: Secondary | ICD-10-CM | POA: Diagnosis not present

## 2020-03-08 NOTE — Progress Notes (Signed)
Subjective:    Patient ID: Devin [name], male    DOB: [birth year], 79 y.o.   MRN: [id number]  DOS:  03/08/2020 Type of visit - description: Acute He is again concerned about the asymmetry of his abdomen, the left abdomen seems to be larger.  On further questioning, he admits to constipation, he used to have a bowel movement daily and  now he has a bowel movement every 5 days for the last couple of years. Occasionally, his stools are hard.  Wt Readings from Last 3 Encounters:  03/08/20 202 lb 8 oz (91.9 kg)  02/22/20 205 lb (93 kg)  01/18/20 213 lb 6 oz (96.8 kg)    Review of Systems Denies nausea, vomiting, diarrhea. No blood in the stools  Past Medical History:  Diagnosis Date  . Arthritis   . Cataract   . COPD (chronic obstructive pulmonary disease) ([place])   . Diverticulosis    pt states he had diverticuli in his bladder also.  . H/O [facility] spotted fever 01/27/2014  . Hematuria    over 5 years ago  . Hernia of abdominal cavity   . History of rectal polyps   . Hyperplastic colon polyp   . Hypertension   . Macular degeneration   . Rectal cancer ([place]) 1994  . Shoulder pain, left    per pt, he can lay on the left side!  . Tremor 10/21/2018  . Tubular adenoma    01/05/2013    Past Surgical History:  Procedure Laterality Date  . APPENDECTOMY    . COLON SURGERY     rectal cancer ~ 1999  . EYE SURGERY     cataract sx  . HERNIA REPAIR    . IR KYPHO LUMBAR INC FX REDUCE BONE BX UNI/BIL CANNULATION INC/IMAGING  06/12/2017  . IR RADIOLOGIST EVAL & MGMT  08/06/2017  . mastoid gland removal     left ear  . PROSTATE SURGERY  05/2015  . TONSILLECTOMY      Allergies as of 03/08/2020      Reactions   Escitalopram Other (See Comments)   "made me feel drunk"      Medication List       Accurate as of March 08, 2020 11:21 AM. If you have any questions, ask your nurse or doctor.        alendronate 70 MG tablet Commonly known as: FOSAMAX Take 1 tablet (70 mg total)  by mouth once a week. Take with a full glass of water.   carvedilol 6.25 MG tablet Commonly known as: COREG Take 1 tablet (6.25 mg total) by mouth 2 (two) times daily with a meal.   fluorouracil 5 % cream Commonly known as: EFUDEX   nortriptyline 50 MG capsule Commonly known as: PAMELOR Take 1 capsule (50 mg total) by mouth at bedtime.   ondansetron 4 MG disintegrating tablet Commonly known as: Zofran ODT Take 1 tablet (4 mg total) by mouth every 8 (eight) hours as needed.          Objective:   Physical Exam BP 131/73 (BP Location: Left Arm, Patient Position: Sitting, Cuff Size: Normal)   Pulse 68   Temp (!) 96.8 F (36 C) (Temporal)   Resp 18   Ht 6\' 1"  (1.854 m)   Wt 202 lb 8 oz (91.9 kg)   SpO2 97%   BMI 26.72 kg/m  General:   Well developed, NAD, BMI noted. HEENT:  Normocephalic . Face symmetric, atraumatic Abdomen: With the patient  is standing and on simple observation indeed the left side of his abdomen seems to be slightly more prominent. Upon palpation, I do not feel any mass, lump, no spigelian hernia. No inguinal hernia Lower extremities: no pretibial edema bilaterally  Skin: Not pale. Not jaundice Neurologic:  alert & oriented X3.  Speech normal, gait appropriate for age and unassisted Psych--  Cognition and judgment appear intact.  Cooperative with normal attention span and concentration.  Behavior appropriate. No anxious or depressed appearing.      Assessment     Assessment HTN Depression-insomnia, lost a son ~2011 GERD sx onset ~2016 MSK: --DJD- hip injection 2017  --L2 vertebral fracture d/t a fall , kyphoplasty 05-2017 --T score 06-2017 -0.2, was rx ca and vit D.  Started  Fosamax 09-2017 BPH --s/p surgery x2 2016 , Dr Hazle Nordmann H/o etoh, mild  Rectal cancer-- s/p surgery ~ 1999, no chemo-XRT FH CAD, F MI age 15 Former light smoker , No symptoms  PLAN Asymmetric abdomen: Exam is benign. Last colonoscopy 2018, redundant colon, no  polyps, no biopsies done. CT abdomen 01/2019: "Diverticulitis, underlying mass cannot be excluded", splenomegaly Ultrasound abdomen 12-2019: Normal, no splenomegaly All of the above tests are reassuring. Constipation: As described in the HPI, onset about 2 years ago, recommend MiraLAX daily.  In addition will refer to GI given relatively new onset of constipation and a abnormal CT abdomen 01/2019.     This visit occurred during the SARS-CoV-2 public health emergency.  Safety protocols were in place, including screening questions prior to the visit, additional usage of staff PPE, and extensive cleaning of exam room while observing appropriate contact time as indicated for disinfecting solutions.

## 2020-03-08 NOTE — Progress Notes (Signed)
Pre visit review using our clinic review tool, if applicable. No additional management support is needed unless otherwise documented below in the visit note. 

## 2020-03-08 NOTE — Patient Instructions (Addendum)
Please schedule Medicare Wellness with Glenard Haring.   Start taking MiraLAX over-the-counter: 17 g daily with plenty fluids  Call if your constipation is not improving

## 2020-03-10 NOTE — Assessment & Plan Note (Signed)
Asymmetric abdomen: Exam is benign. Last colonoscopy 2018, redundant colon, no polyps, no biopsies done. CT abdomen 01/2019: "Diverticulitis, underlying mass cannot be excluded", splenomegaly Ultrasound abdomen 12-2019: Normal, no splenomegaly All of the above tests are reassuring. Constipation: As described in the HPI, onset about 2 years ago, recommend MiraLAX daily.  In addition will refer to GI given relatively new onset of constipation and a abnormal CT abdomen 01/2019.

## 2020-03-16 ENCOUNTER — Telehealth: Payer: Self-pay | Admitting: Gastroenterology

## 2020-03-16 ENCOUNTER — Encounter: Payer: Self-pay | Admitting: Internal Medicine

## 2020-03-16 NOTE — Telephone Encounter (Signed)
Spoke with patient, patient states that he has a large bump on his (L) side, pt states that he has one BM every 4 days which is "normal" for him, pt states that the size of his bowel movements are about  4-5 inches in diameter, pt denies any pain, pt describes area as achy, pt denies any nausea, vomiting. Pt states that area feels bruised in that area. Pt denied being constipated but was recently seen by PCP for constipation, pt advised to begin Miralax. Patient was asked multiple times if he was taking the Miralax, pt stated that he was but at the end of the conversation pt states that he was taking Fiber not Miralax. Pt advised to take Miralax as directed by PCP, pt has appt with Carl Best on 04/12/20 at 9:30 am. Pt advised to go to ER if his symptoms worsen, or if he has severe pain or bleeding. Pt advised to keep upcoming appt.

## 2020-03-22 DIAGNOSIS — H01004 Unspecified blepharitis left upper eyelid: Secondary | ICD-10-CM | POA: Diagnosis not present

## 2020-03-22 DIAGNOSIS — H353211 Exudative age-related macular degeneration, right eye, with active choroidal neovascularization: Secondary | ICD-10-CM | POA: Diagnosis not present

## 2020-03-22 DIAGNOSIS — H04123 Dry eye syndrome of bilateral lacrimal glands: Secondary | ICD-10-CM | POA: Diagnosis not present

## 2020-04-03 DIAGNOSIS — L820 Inflamed seborrheic keratosis: Secondary | ICD-10-CM | POA: Diagnosis not present

## 2020-04-03 DIAGNOSIS — L570 Actinic keratosis: Secondary | ICD-10-CM | POA: Diagnosis not present

## 2020-04-12 ENCOUNTER — Ambulatory Visit: Payer: Medicare HMO | Admitting: Nurse Practitioner

## 2020-04-12 ENCOUNTER — Other Ambulatory Visit (INDEPENDENT_AMBULATORY_CARE_PROVIDER_SITE_OTHER): Payer: Medicare HMO

## 2020-04-12 ENCOUNTER — Encounter: Payer: Self-pay | Admitting: Nurse Practitioner

## 2020-04-12 VITALS — BP 120/70 | HR 70 | Ht 74.0 in | Wt 206.0 lb

## 2020-04-12 DIAGNOSIS — R1900 Intra-abdominal and pelvic swelling, mass and lump, unspecified site: Secondary | ICD-10-CM

## 2020-04-12 DIAGNOSIS — K5900 Constipation, unspecified: Secondary | ICD-10-CM

## 2020-04-12 DIAGNOSIS — Z85048 Personal history of other malignant neoplasm of rectum, rectosigmoid junction, and anus: Secondary | ICD-10-CM

## 2020-04-12 LAB — COMPREHENSIVE METABOLIC PANEL
ALT: 18 U/L (ref 0–53)
AST: 18 U/L (ref 0–37)
Albumin: 4.4 g/dL (ref 3.5–5.2)
Alkaline Phosphatase: 63 U/L (ref 39–117)
BUN: 14 mg/dL (ref 6–23)
CO2: 31 mEq/L (ref 19–32)
Calcium: 9.6 mg/dL (ref 8.4–10.5)
Chloride: 103 mEq/L (ref 96–112)
Creatinine, Ser: 0.74 mg/dL (ref 0.40–1.50)
GFR: 102.25 mL/min (ref >60.00)
Glucose, Bld: 105 mg/dL — ABNORMAL HIGH (ref 70–99)
Potassium: 4.4 mEq/L (ref 3.5–5.1)
Sodium: 140 mEq/L (ref 135–145)
Total Bilirubin: 0.5 mg/dL (ref 0.2–1.2)
Total Protein: 7.2 g/dL (ref 6.0–8.3)

## 2020-04-12 LAB — CBC WITH DIFFERENTIAL/PLATELET
Basophils Absolute: 0 10*3/uL (ref 0.0–0.1)
Basophils Relative: 0.3 % (ref 0.0–3.0)
Eosinophils Absolute: 0.1 10*3/uL (ref 0.0–0.7)
Eosinophils Relative: 2.7 % (ref 0.0–5.0)
HCT: 42.3 % (ref 39.0–52.0)
Hemoglobin: 14.3 g/dL (ref 13.0–17.0)
Lymphocytes Relative: 40.2 % (ref 12.0–46.0)
Lymphs Abs: 2.2 10*3/uL (ref 0.7–4.0)
MCHC: 33.7 g/dL (ref 30.0–36.0)
MCV: 91.9 fl (ref 78.0–100.0)
Monocytes Absolute: 0.5 10*3/uL (ref 0.1–1.0)
Monocytes Relative: 9.3 % (ref 3.0–12.0)
Neutro Abs: 2.6 10*3/uL (ref 1.4–7.7)
Neutrophils Relative %: 47.5 % (ref 43.0–77.0)
Platelets: 144 10*3/uL — ABNORMAL LOW (ref 150.0–400.0)
RBC: 4.6 Mil/uL (ref 4.22–5.81)
RDW: 13.5 % (ref 11.5–15.5)
WBC: 5.5 10*3/uL (ref 4.0–10.5)

## 2020-04-12 NOTE — Patient Instructions (Addendum)
If you are age 78 or older, your body mass index should be between 23-30. Your Body mass index is 26.45 kg/m. If this is out of the aforementioned range listed, please consider follow up with your Primary Care Provider.  If you are age 5 or younger, your body mass index should be between 19-25. Your Body mass index is 26.45 kg/m. If this is out of the aformentioned range listed, please consider follow up with your Primary Care Provider.   Your provider has requested that you go to the basement level for lab work before leaving today. Press "B" on the elevator. The lab is located at the first door on the left as you exit the elevator.   You have been scheduled for a CT scan of the abdomen and pelvis at [facility], 1st floor Radiology. You are scheduled on  05/02/2020  at 11:00am  You should arrive 15 minutes prior to your appointment time for registration. Please go to [facility] radiology to pick up your prep and instructions at least 24 hours prior to your appointment.  If you have any questions regarding your exam or if you need to reschedule, you may call [facility] at [phone] between the hours of 8:00 am and 5:00 pm, Monday-Friday.    Take Miralax 1 capful mixed in 8 ounces of water at bed time for constipation as tolerated.  Due to recent changes in healthcare laws, you may see the results of your imaging and laboratory studies on MyChart before your provider has had a chance to review them.  We understand that in some cases there may be results that are confusing or concerning to you. Not all laboratory results come back in the same time frame and the provider may be waiting for multiple results in order to interpret others.  Please give [place] 48 hours in order for your provider to thoroughly review all the results before contacting the office for clarification of your results.   Thank you for choosing [facility] Noralyn Pick, CRNP

## 2020-04-12 NOTE — Progress Notes (Signed)
04/12/2020 Devin [name] [id number] 25-Apr-1941   Chief Complaint: bulge left abdomen   History of Present Illness: Devin [name]. Devin [name] is a 79 year old male with a past medical history of arthritis, hypertension, COPD, macular degeneration, osteoarthritis, diverticulitis 01/2019, colon polyps and rectal cancer 1994 s/p surgery, no chemotherapy or radiation. He presents today for further evaluation regarding a bulge to his left mid abdomen which he first noticed 2 months ago.  He fell and cracked his left rib cage approximately 2 months ago.  He believes the bulge to his left abdomen started prior to cracking his ribs.  He scribes feeling as if stool sits to the left colon which results in bulge and swelling.  No specific abdominal pain.  He reports passing extremely large formed solid bowel movements every day or every other day which clog the toilet on a consistent basis.  If he eats out at [employer] he brings his own plunger. Yesterday, he passed 2 large solid stools. He eats a high fiber diet on a daily basis. He was recently seen by his PCP who assessed he had constipation and Miralax was recommended to increase his stool output. He took the MiraLAX for over 1 week which resulted in fairly normal bowel movements and he had less left mid abdominal bulge/swelling.  However, he stopped taking the MiraLAX as instructions on the back of the bottle said not to take for more than 1 week.  He denies having any rectal bleeding.  No melena.  No fever, sweats or chills.  No weight loss.  His most recent colonoscopy was 05/20/2017 which identified a solitary ulcer in the transverse colon and diverticulosis.  The colon was redundant.  No polyps.  He was advised by Dr. Loletha Carrow to repeat a colonoscopy in 5 years.    In review of his records, he presented to [place] emergency room 02/15/2019 with abdominal pain.  An abdominal/pelvic CT scan identified sigmoid diverticulitis.  He was treated with Cipro 500 mg  p.o. twice daily and Metronidazole 500 mg p.o. twice daily for at least 7 days. The patient followed up with his PCP on 03/01/2019, his diverticulitis pain was resolved at that time.   Abdominal sonogram 01/27/2020: 1. Negative for splenomegaly. Spleen appears stable and normal compared to the 2020 CT Abdomen and Pelvis. 2. Normal abdominal ultrasound.  Abdominal/pelvic CT 02/15/2019: 1. Uncomplicated sigmoid diverticulitis. An underlying mass cannot be excluded. Follow-up with a nonemergent outpatient colonoscopy is recommended. 2. Large amount of stool throughout the colon. Correlate for constipation. 3. Splenomegaly. Aortic Atherosclerosis    Colonoscopy 05/20/2017 by Dr. Loletha Carrow: - Diverticulosis in the entire examined colon. - Redundant colon. - A single (solitary) ulcer in the distal transverse colon. Most likely aspirin-induced. - The examination was otherwise normal on direct and retroflexion views. - No specimens collected. - 5 year recall  Allergies  Allergen Reactions  . Escitalopram Other (See Comments)    "made me feel drunk"      Current Outpatient Medications on File Prior to Visit  Medication Sig Dispense Refill  . alendronate (FOSAMAX) 70 MG tablet Take 1 tablet (70 mg total) by mouth once a week. Take with a full glass of water. 12 tablet 3  . carvedilol (COREG) 6.25 MG tablet Take 1 tablet (6.25 mg total) by mouth 2 (two) times daily with a meal. 30 tablet 0  . fluorouracil (EFUDEX) 5 % cream     . nortriptyline (PAMELOR) 50 MG capsule  Take 1 capsule (50 mg total) by mouth at bedtime. 90 capsule 1   No current facility-administered medications on file prior to visit.    Current Medications, Allergies, Past Medical History, Past Surgical History, Family History and Social History were reviewed in [employer] record.   Physical Exam: There were no vitals taken for this visit. General: Well developed 79 year old male in no acute  distress. Head: Normocephalic and atraumatic. Eyes: No scleral icterus. Conjunctiva pink . Ears: Normal auditory acuity. Mouth: Dentition intact. No ulcers or lesions.  Lungs: Clear throughout to auscultation. Heart: Regular rate and rhythm, no murmur. Abdomen: Soft, nontender. When standing upright he has asymmetrical bulging to the left mid abdomen, no obvious hernia.  No masses or hepatomegaly. Normal bowel sounds x 4 quadrants.  Rectal: Deferred.  Musculoskeletal: Symmetrical with no gross deformities. Extremities: No edema. Neurological: Alert oriented x 4. No focal deficits.  Psychological: Alert and cooperative. Normal mood and affect  Assessment and Recommendations:  83.  79 year old male with a history of rectal cancer status post resection in 1997 presents with left mid abdominal bulge for the past 2 months.  His most recent colonoscopy was done 04/2017 which showed diverticulosis, a solitary ulcer to the transverse colon and no polyps.  Recall colonoscopy due 04/2022. -Abdominal/pelvic CAT scan with oral and IV contrast  -Resume MiraLAX 1 capful mixed in 8 ounces of water daily to facilitate a BM daily, reduce the risk of stool backing up in the colon -CBC, CMP -Further recommendations to be determined after the above evaluation completed  2.  History of sigmoid diverticulitis 01/2019 treated with oral Cipro and metronidazole. -Await the above abdominal/pelvic CT results.   -He will require an earlier colonoscopy if his abdominal/pelvic CT identifies diverticulitis

## 2020-04-18 ENCOUNTER — Ambulatory Visit (HOSPITAL_COMMUNITY): Payer: Medicare HMO

## 2020-04-20 NOTE — Progress Notes (Signed)
____________________________________________________________  Attending physician addendum:  Thank you for sending this case to me. I have reviewed the entire note, and the outlined plan seems appropriate.  I agree with the CT scan.  This sounds most likely benign, and perhaps he is consuming too much dietary fiber based on your description of his bowel habits.  Wilfrid Lund, MD  ____________________________________________________________

## 2020-05-02 ENCOUNTER — Other Ambulatory Visit: Payer: Self-pay

## 2020-05-02 ENCOUNTER — Encounter (HOSPITAL_COMMUNITY): Payer: Self-pay

## 2020-05-02 ENCOUNTER — Ambulatory Visit (HOSPITAL_COMMUNITY)
Admission: RE | Admit: 2020-05-02 | Discharge: 2020-05-02 | Disposition: A | Discharge status: Home / Self Care | Payer: Medicare HMO | Source: Ambulatory Visit | Attending: Nurse Practitioner | Admitting: Nurse Practitioner

## 2020-05-02 DIAGNOSIS — K5730 Diverticulosis of large intestine without perforation or abscess without bleeding: Secondary | ICD-10-CM | POA: Diagnosis not present

## 2020-05-02 DIAGNOSIS — N2889 Other specified disorders of kidney and ureter: Secondary | ICD-10-CM | POA: Diagnosis not present

## 2020-05-02 DIAGNOSIS — R1900 Intra-abdominal and pelvic swelling, mass and lump, unspecified site: Secondary | ICD-10-CM | POA: Insufficient documentation

## 2020-05-02 DIAGNOSIS — Z85048 Personal history of other malignant neoplasm of rectum, rectosigmoid junction, and anus: Secondary | ICD-10-CM | POA: Diagnosis not present

## 2020-05-02 DIAGNOSIS — K5900 Constipation, unspecified: Secondary | ICD-10-CM | POA: Diagnosis not present

## 2020-05-02 DIAGNOSIS — K7689 Other specified diseases of liver: Secondary | ICD-10-CM | POA: Diagnosis not present

## 2020-05-02 MED ORDER — IOHEXOL 300 MG/ML  SOLN
100.0000 mL | Freq: Once | INTRAMUSCULAR | Status: AC | PRN
  Administered 2020-05-02: 100 mL via INTRAVENOUS

## 2020-05-02 MED ORDER — SODIUM CHLORIDE (PF) 0.9 % IJ SOLN
INTRAMUSCULAR | Status: AC
  Filled 2020-05-02: qty 50

## 2020-05-03 NOTE — Progress Notes (Signed)
Reached pt for awv. Pt states on the way to doctor for an eye issue. Rescheduled for 05/09/20

## 2020-05-04 ENCOUNTER — Ambulatory Visit: Payer: Medicare HMO | Admitting: *Deleted

## 2020-05-04 ENCOUNTER — Other Ambulatory Visit: Payer: Self-pay

## 2020-05-04 DIAGNOSIS — H353211 Exudative age-related macular degeneration, right eye, with active choroidal neovascularization: Secondary | ICD-10-CM | POA: Diagnosis not present

## 2020-05-08 NOTE — Progress Notes (Addendum)
Subjective:   Devin [name] is a 79 y.o. male who presents for Medicare Annual/Subsequent preventive examination.  Review of Systems     Cardiac Risk Factors include: advanced age (>34men, >28 women);hypertension     Objective:    Today's Vitals   05/09/20 1022  BP: 124/83  Pulse: 70  Temp: (!) 97 F (36.1 C)  TempSrc: Temporal  SpO2: 95%  Weight: 206 lb 6.4 oz (93.6 kg)  Height: 6\' 2"  (1.88 m)   Body mass index is 26.5 kg/m.  Advanced Directives 05/09/2020 02/15/2019 10/26/2018 07/13/2017 07/08/2017 06/07/2017 09/08/2016  Does Patient Have a Medical Advance Directive? Yes No Yes No Yes Yes Yes  Type of Paramedic of [place];Living will - [place];Living will - [place];Living will [place];Living will Out of facility DNR (pink MOST or yellow form)  Does patient want to make changes to medical advance directive? No - Patient declined - No - Patient declined - - No - Patient declined No - Patient declined  Copy of [place] in Chart? No - copy requested - Yes - validated most recent copy scanned in chart (See row information) - No - copy requested No - copy requested -    Current Medications (verified) Outpatient Encounter Medications as of 05/09/2020  Medication Sig   alendronate (FOSAMAX) 70 MG tablet Take 1 tablet (70 mg total) by mouth once a week. Take with a full glass of water.   carvedilol (COREG) 6.25 MG tablet Take 1 tablet (6.25 mg total) by mouth 2 (two) times daily with a meal.   fluorouracil (EFUDEX) 5 % cream    nortriptyline (PAMELOR) 50 MG capsule Take 1 capsule (50 mg total) by mouth at bedtime.   No facility-administered encounter medications on file as of 05/09/2020.    Allergies (verified) Escitalopram   History: Past Medical History:  Diagnosis Date   Arthritis    Cataract    COPD (chronic obstructive pulmonary disease) ([place])     Diverticulosis    pt states he had diverticuli in his bladder also.   [facility] spotted fever 01/27/2014   Hematuria    over 5 years ago   Hernia of abdominal cavity    History of rectal polyps    Hyperplastic colon polyp    Hypertension    Macular degeneration    Rectal cancer ([place]) 1994   Shoulder pain, left    per pt, he can lay on the left side!   Tremor 10/21/2018   Tubular adenoma    01/05/2013   Past Surgical History:  Procedure Laterality Date   APPENDECTOMY     COLON SURGERY     rectal cancer ~ 1999   EYE SURGERY     cataract sx   HERNIA REPAIR     IR KYPHO LUMBAR INC FX REDUCE BONE BX UNI/BIL CANNULATION INC/IMAGING  06/12/2017   IR RADIOLOGIST EVAL & MGMT  08/06/2017   mastoid gland removal     left ear   PROSTATE SURGERY  05/2015   TONSILLECTOMY     Family History  Problem Relation Age of Onset   Breast cancer Mother    Colon cancer Mother 31       died 91   Aneurysm Father    Heart attack Father        49   Aneurysm Son        aorta   Stroke Paternal Grandfather  Cancer Paternal Grandmother    Cancer Maternal Grandmother    Prostate cancer Neg Hx    Esophageal cancer Neg Hx    Pancreatic cancer Neg Hx    Rectal cancer Neg Hx    Stomach cancer Neg Hx    Social History   Socioeconomic History   Marital status: Legally Separated    Spouse name: Denice Paradise   Number of children: 2   Years of education: Not on file   Highest education level: Not on file  Occupational History   Occupation: Retired- Pharmacologist  Tobacco Use   Smoking status: Former Smoker    Quit date: 04/19/1989    Years since quitting: 31.0   Smokeless tobacco: Never Used  Vaping Use   Vaping Use: Never used  Substance and Sexual Activity   Alcohol use: Yes    Alcohol/week: 14.0 standard drinks    Types: 14 Glasses of wine per week    Comment: most days  , amounts varies , sometimes > 2 servings    Drug use: No   Sexual activity:  Not Currently  Other Topics Concern   Not on file  Social History Narrative   Household-- pt, wife Denice Paradise)   [place] from [place] ~ 01/05/10   Youngest son lives in [place] reason why he moved here        Separated from wife 09/2018, thinking about going back to [place], this is her second marriage.   She lost her first husband.   Aaron Edelman adopted their son  [name] who died in 05-Jan-2010, he was an alcoholic.   They have together  one biological son.  He lives in [place].      Right handed   Occasional caffeine usage     Social Determinants of Health   Financial Resource Strain: Low Risk    Difficulty of Paying Living Expenses: Not hard at all  Food Insecurity: No Food Insecurity   Worried About Charity fundraiser in the Last Year: Never true   Ran Out of Food in the Last Year: Never true  Transportation Needs: No Transportation Needs   Lack of Transportation (Medical): No   Lack of Transportation (Non-Medical): No  Physical Activity:    Days of Exercise per Week:    Minutes of Exercise per Session:   Stress:    Feeling of Stress :   Social Connections:    Frequency of Communication with Friends and Family:    Frequency of Social Gatherings with Friends and Family:    Attends Religious Services:    Active Member of Clubs or Organizations:    Attends Archivist Meetings:    Marital Status:     Tobacco Counseling Counseling given: Not Answered   Clinical Intake:     Pain : No/denies pain   Activities of Daily Living In your present state of health, do you have any difficulty performing the following activities: 05/09/2020 01/18/2020  Hearing? Y N  Comment left ear difficulty. has hearing aid -  Vision? N N  Difficulty concentrating or making decisions? N N  Walking or climbing stairs? N N  Dressing or bathing? N N  Doing errands, shopping? N N  Preparing Food and eating ? N -  Using the Toilet? N -  In the past six months, have  you accidently leaked urine? N -  Do you have problems with loss of bowel control? N -  Managing your Medications?  N -  Managing your Finances? N -  Housekeeping or managing your Housekeeping? N -  Some recent data might be hidden    Patient Care Team: Colon Branch, MD as PCP - General (Internal Medicine) Puschinsky, Fransico Him., MD as Consulting Physician (General Surgery) [place] Crane, MD as Consulting Physician (Internal Medicine) Zadie Rhine Clent Demark, MD as Consulting Physician (Ophthalmology)  Indicate any recent Medical Services you may have received from other than [facility] providers in the past year (date may be approximate).     Assessment:   This is a routine wellness examination for [place].  Dietary issues and exercise activities discussed: Current Exercise Habits: The patient does not participate in regular exercise at present, Exercise limited by: None identified Diet (meal preparation, eat out, water intake, caffeinated beverages, dairy products, fruits and vegetables): in general, a "healthy" diet  , well balanced     Goals     Maintain healty lifestyle      Depression Screen PHQ 2/9 Scores 05/09/2020 01/18/2020 10/26/2018 08/30/2018 10/21/2017 09/08/2016 11/27/2015  PHQ - 2 Score 0 0 1 1 0 0 0  PHQ- 9 Score - 2 - 4 - - -    Fall Risk Fall Risk  05/09/2020 05/09/2020 01/18/2020 10/26/2018 02/09/2018  Falls in the past year? 1 0 0 1 Yes  Number falls in past yr: 1 0 0 1 2 or more  Injury with Fall? 0 0 0 0 No  Risk for fall due to : - - - Impaired balance/gait -  Follow up Education provided;Falls prevention discussed Education provided;Falls prevention discussed Falls evaluation completed - -    Any stairs in or around the home? Yes  If so, are there any without handrails? No  Home free of loose throw rugs in walkways, pet beds, electrical cords, etc? Yes  Adequate lighting in your home to reduce risk of falls? Yes   ASSISTIVE DEVICES UTILIZED TO PREVENT FALLS:  Life alert?  No  Use of a cane, walker or w/c? Yes  Grab bars in the bathroom? No  Shower chair or bench in shower? No  Elevated toilet seat or a handicapped toilet? No    Gait steady and fast without use of assistive device  Cognitive Function: Ad8 score reviewed for issues:  Issues making decisions:no  Less interest in hobbies / activities:no  Repeats questions, stories (family complaining):no  Trouble using ordinary gadgets (microwave, computer, phone):no  Forgets the month or year: no  Mismanaging finances: no  Remembering appts:no  Daily problems with thinking and/or memory:no Ad8 score is=0   MMSE - Mini Mental State Exam 10/21/2018 09/08/2016  Orientation to time 4 5  Orientation to Place 5 5  Registration 3 3  Attention/ Calculation 5 5  Recall 3 3  Language- name 2 objects 2 2  Language- repeat 1 1  Language- follow 3 step command 3 3  Language- read & follow direction 1 1  Write a sentence 1 1  Copy design 1 1  Total score 29 30        Immunizations Immunization History  Administered Date(s) Administered   Influenza, High Dose Seasonal PF 07/12/2018   Influenza,inj,Quad PF,6+ Mos 07/27/2014, 05/30/2015, 06/13/2016, 06/19/2017   Influenza-Unspecified 07/14/2019   PFIZER SARS-COV-2 Vaccination 01/19/2020, 02/10/2020   Pneumococcal Conjugate-13 07/27/2014   Pneumococcal Polysaccharide-23 10/21/2017   Pneumococcal-Unspecified 09/30/2007   Td 10/21/2017   Tdap 05/26/2007   Zoster 09/30/2007   Zoster Recombinat (Shingrix) 11/15/2018, 03/08/2020    TDAP status: Up  to date Flu Vaccine status: Up to date Pneumococcal vaccine status: Up to date Covid-19 vaccine status: Completed vaccines  Qualifies for Shingles Vaccine? Yes   Zostavax completed Yes   Shingrix Completed?: Yes  Screening Tests Health Maintenance  Topic Date Due   Hepatitis C Screening  Never done   INFLUENZA VACCINE  04/29/2020   COLONOSCOPY  05/20/2022   TETANUS/TDAP   10/22/2027   COVID-19 Vaccine  Completed   PNA vac Low Risk Adult  Completed    Health Maintenance  Health Maintenance Due  Topic Date Due   Hepatitis C Screening  Never done   INFLUENZA VACCINE  04/29/2020    Colorectal cancer screening: Completed 05/20/17. Repeat every 5 years  Lung Cancer Screening: (Low Dose CT Chest recommended if Age 18-80 years, 30 pack-year currently smoking OR have quit w/in 15years.) does not qualify.  Additional Screening:   Vision Screening: Recommended annual ophthalmology exams for early detection of glaucoma and other disorders of the eye. Is the patient up to date with their annual eye exam?  Yes per pt   Dental Screening: Recommended annual dental exams for proper oral hygiene  Community Resource Referral / Chronic Care Management: CRR required this visit?  No   CCM required this visit?  No      Plan:    Please schedule your next medicare wellness visit with me in 1 yr.  Continue to eat heart healthy diet (full of fruits, vegetables, whole grains, lean protein, water--limit salt, fat, and sugar intake) and increase physical activity as tolerated.  Continue doing brain stimulating activities (puzzles, reading, adult coloring books, staying active) to keep memory sharp.    I have personally reviewed and noted the following in the patients chart:    Medical and social history  Use of alcohol, tobacco or illicit drugs   Current medications and supplements  Functional ability and status  Nutritional status  Physical activity  Advanced directives  List of other physicians  Hospitalizations, surgeries, and ER visits in previous 12 months  Vitals  Screenings to include cognitive, depression, and falls  Referrals and appointments  In addition, I have reviewed and discussed with patient certain preventive protocols, quality metrics, and best practice recommendations. A written personalized care plan for preventive  services as well as general preventive health recommendations were provided to patient.    Shela Nevin, [place]   05/09/2020

## 2020-05-09 ENCOUNTER — Other Ambulatory Visit: Payer: Self-pay

## 2020-05-09 ENCOUNTER — Encounter: Payer: Self-pay | Admitting: *Deleted

## 2020-05-09 ENCOUNTER — Ambulatory Visit (INDEPENDENT_AMBULATORY_CARE_PROVIDER_SITE_OTHER): Payer: Medicare HMO | Admitting: *Deleted

## 2020-05-09 VITALS — BP 124/83 | HR 70 | Temp 97.0°F | Ht 74.0 in | Wt 206.4 lb

## 2020-05-09 DIAGNOSIS — Z0000 Encounter for general adult medical examination without abnormal findings: Secondary | ICD-10-CM

## 2020-05-09 NOTE — Patient Instructions (Signed)
Please schedule your next medicare wellness visit with me in 1 yr.  Continue to eat heart healthy diet (full of fruits, vegetables, whole grains, lean protein, water--limit salt, fat, and sugar intake) and increase physical activity as tolerated.  Continue doing brain stimulating activities (puzzles, reading, adult coloring books, staying active) to keep memory sharp.    Devin [name] , Thank you for taking time to come for your Medicare Wellness Visit. I appreciate your ongoing commitment to your health goals. Please review the following plan we discussed and let me know if I can assist you in the future.   These are the goals we discussed: Goals    . Maintain healty lifestyle       This is a list of the screening recommended for you and due dates:  Health Maintenance  Topic Date Due  .  Hepatitis C: One time screening is recommended by Center for Disease Control  (CDC) for  adults born from [birth year] through 1964.   Never done  . Flu Shot  04/29/2020  . Colon Cancer Screening  05/20/2022  . Tetanus Vaccine  10/22/2027  . COVID-19 Vaccine  Completed  . Pneumonia vaccines  Completed    Preventive Care 78 Years and Older, Male Preventive care refers to lifestyle choices and visits with your health care provider that can promote health and wellness. This includes:  A yearly physical exam. This is also called an annual well check.  Regular dental and eye exams.  Immunizations.  Screening for certain conditions.  Healthy lifestyle choices, such as diet and exercise. What can I expect for my preventive care visit? Physical exam Your health care provider will check:  Height and weight. These may be used to calculate body mass index (BMI), which is a measurement that tells if you are at a healthy weight.  Heart rate and blood pressure.  Your skin for abnormal spots. Counseling Your health care provider may ask you questions about:  Alcohol, tobacco, and drug use.  Emotional  well-being.  Home and relationship well-being.  Sexual activity.  Eating habits.  History of falls.  Memory and ability to understand (cognition).  Work and work Astronomer. What immunizations do I need?  Influenza (flu) vaccine  This is recommended every year. Tetanus, diphtheria, and pertussis (Tdap) vaccine  You may need a Td booster every 10 years. Varicella (chickenpox) vaccine  You may need this vaccine if you have not already been vaccinated. Zoster (shingles) vaccine  You may need this after age 65. Pneumococcal conjugate (PCV13) vaccine  One dose is recommended after age 37. Pneumococcal polysaccharide (PPSV23) vaccine  One dose is recommended after age 59. Measles, mumps, and rubella (MMR) vaccine  You may need at least one dose of MMR if you were born in 1957 or later. You may also need a second dose. Meningococcal conjugate (MenACWY) vaccine  You may need this if you have certain conditions. Hepatitis A vaccine  You may need this if you have certain conditions or if you travel or work in places where you may be exposed to hepatitis A. Hepatitis B vaccine  You may need this if you have certain conditions or if you travel or work in places where you may be exposed to hepatitis B. Haemophilus influenzae type b (Hib) vaccine  You may need this if you have certain conditions. You may receive vaccines as individual doses or as more than one vaccine together in one shot (combination vaccines). Talk with your health care provider about  the risks and benefits of combination vaccines. What tests do I need? Blood tests  Lipid and cholesterol levels. These may be checked every 5 years, or more frequently depending on your overall health.  Hepatitis C test.  Hepatitis B test. Screening  Lung cancer screening. You may have this screening every year starting at age 78 if you have a 30-pack-year history of smoking and currently smoke or have quit within the  past 15 years.  Colorectal cancer screening. All adults should have this screening starting at age 78 and continuing until age 78. Your health care provider may recommend screening at age 1 if you are at increased risk. You will have tests every 1-10 years, depending on your results and the type of screening test.  Prostate cancer screening. Recommendations will vary depending on your family history and other risks.  Diabetes screening. This is done by checking your blood sugar (glucose) after you have not eaten for a while (fasting). You may have this done every 1-3 years.  Abdominal aortic aneurysm (AAA) screening. You may need this if you are a current or former smoker.  Sexually transmitted disease (STD) testing. Follow these instructions at home: Eating and drinking  Eat a diet that includes fresh fruits and vegetables, whole grains, lean protein, and low-fat dairy products. Limit your intake of foods with high amounts of sugar, saturated fats, and salt.  Take vitamin and mineral supplements as recommended by your health care provider.  Do not drink alcohol if your health care provider tells you not to drink.  If you drink alcohol: ? Limit how much you have to 0-2 drinks a day. ? Be aware of how much alcohol is in your drink. In the [place] one drink equals one 12 oz bottle of beer (355 mL), one 5 oz glass of wine (148 mL), or one 1 oz glass of hard liquor (44 mL). Lifestyle  Take daily care of your teeth and gums.  Stay active. Exercise for at least 30 minutes on 5 or more days each week.  Do not use any products that contain nicotine or tobacco, such as cigarettes, e-cigarettes, and chewing tobacco. If you need help quitting, ask your health care provider.  If you are sexually active, practice safe sex. Use a condom or other form of protection to prevent STIs (sexually transmitted infections).  Talk with your health care provider about taking a low-dose aspirin or  statin. What's next?  Visit your health care provider once a year for a well check visit.  Ask your health care provider how often you should have your eyes and teeth checked.  Stay up to date on all vaccines. This information is not intended to replace advice given to you by your health care provider. Make sure you discuss any questions you have with your health care provider. Document Revised: 09/09/2018 Document Reviewed: 09/09/2018 Elsevier Patient Education  2020 [employer].

## 2020-05-17 DIAGNOSIS — H01005 Unspecified blepharitis left lower eyelid: Secondary | ICD-10-CM | POA: Diagnosis not present

## 2020-05-22 DIAGNOSIS — H04212 Epiphora due to excess lacrimation, left lacrimal gland: Secondary | ICD-10-CM | POA: Diagnosis not present

## 2020-05-23 ENCOUNTER — Ambulatory Visit (INDEPENDENT_AMBULATORY_CARE_PROVIDER_SITE_OTHER): Payer: Medicare HMO | Admitting: Family Medicine

## 2020-05-23 ENCOUNTER — Other Ambulatory Visit: Payer: Self-pay

## 2020-05-23 ENCOUNTER — Encounter: Payer: Self-pay | Admitting: Family Medicine

## 2020-05-23 VITALS — BP 132/80 | HR 89 | Temp 98.5°F | Ht 74.0 in | Wt 207.0 lb

## 2020-05-23 DIAGNOSIS — M25511 Pain in right shoulder: Secondary | ICD-10-CM | POA: Diagnosis not present

## 2020-05-23 DIAGNOSIS — M25512 Pain in left shoulder: Secondary | ICD-10-CM

## 2020-05-23 DIAGNOSIS — G8929 Other chronic pain: Secondary | ICD-10-CM

## 2020-05-23 MED ORDER — METHYLPREDNISOLONE ACETATE 40 MG/ML IJ SUSP
40.0000 mg | Freq: Once | INTRAMUSCULAR | Status: AC
Start: 2020-05-23 — End: 2020-05-23
  Administered 2020-05-23: 40 mg via INTRA_ARTICULAR

## 2020-05-23 MED ORDER — METHYLPREDNISOLONE ACETATE 40 MG/ML IJ SUSP
40.0000 mg | Freq: Once | INTRAMUSCULAR | Status: AC
  Administered 2020-05-23: 40 mg via INTRA_ARTICULAR

## 2020-05-23 NOTE — Progress Notes (Signed)
Chief Complaint  Patient presents with  . Shoulder Pain    left  . Testicle Pain    Subjective: Patient is a 79 y.o. male here for f/u.  Patient has a history of bilateral shoulder pain.  The left one started bothering him around 1 week ago.  The right 1 has been chronic and he had an injection 3 months ago.  It was quite helpful but pain returned around a week ago.  No injury or change in activity.  No numbness or tingling.  He has been given stretches and exercises for the shoulders.  2 days ago he had swelling in his right testicle.  There was a single episode of sharp pain.  The swelling has resolved and he is not having pain.  No urinary complaints.  No injury.  No redness or fevers.  Past Medical History:  Diagnosis Date  . Arthritis   . Cataract   . COPD (chronic obstructive pulmonary disease) ([place])   . Diverticulosis    pt states he had diverticuli in his bladder also.  . H/O [facility] spotted fever 01/27/2014  . Hematuria    over 5 years ago  . Hernia of abdominal cavity   . History of rectal polyps   . Hyperplastic colon polyp   . Hypertension   . Macular degeneration   . Rectal cancer ([place]) 1994  . Shoulder pain, left    per pt, he can lay on the left side!  . Tremor 10/21/2018  . Tubular adenoma    01/05/2013    Objective: BP 132/80 (BP Location: Left Arm, Patient Position: Sitting, Cuff Size: Normal)   Pulse 89   Temp 98.5 F (36.9 C) (Oral)   Ht 6\' 2"  (1.88 m)   Wt 207 lb (93.9 kg)   SpO2 97%   BMI 26.58 kg/m  General: Awake, appears stated age GU: No ttp or edema of b/l testicles.  Lungs: No accessory muscle use Psych: Age appropriate judgment and insight, normal affect and mood  Procedure Note; Shoulder bursa injection Verbal consent obtained. The area was palpated, an area was marked just caudal to the acromion process laterally, and cleaned with alcohol x1. A 27-gauge needle was used to enter the joint laterally with ease. 40 mg of  Depomedrol with 2 mL of 2% lidocaine was injected. A bandaid was placed. This process was repeated on the contralateral side.  The patient tolerated the procedure well. There were no complications noted.   Assessment and Plan: Chronic pain of both shoulders - Plan: methylPREDNISolone acetate (DEPO-MEDROL) injection 40 mg, methylPREDNISolone acetate (DEPO-MEDROL) injection 40 mg, PR DRAIN/INJECT LARGE JOINT/BURSA  Orders as above. Cont stretches. Recorded procedure on his tablet at his request so his GP in [place] can perform in 3 mo. Testicular exam is not concerning.  F/u as originally scheduled w reg pcp.  The patient voiced understanding and agreement to the plan.  Ramos, DO 05/23/20  9:06 AM

## 2020-05-23 NOTE — Progress Notes (Signed)
depo 

## 2020-05-23 NOTE — Patient Instructions (Signed)
I'd recommend waiting 3 months prior to getting another shot.  Continue stretches/exercise.  Let us know if you need anything.

## 2020-07-30 ENCOUNTER — Encounter: Payer: Medicare HMO | Admitting: Internal Medicine

## 2020-08-20 ENCOUNTER — Telehealth: Payer: Self-pay | Admitting: Internal Medicine

## 2020-08-20 DIAGNOSIS — G4700 Insomnia, unspecified: Secondary | ICD-10-CM

## 2020-08-20 NOTE — Telephone Encounter (Signed)
Rx sent 

## 2020-08-20 NOTE — Telephone Encounter (Signed)
Refill request for clotrimazole-betamethasone cream. Okay to refill?

## 2020-10-25 ENCOUNTER — Encounter: Payer: Medicare HMO | Admitting: Internal Medicine

## 2020-11-26 ENCOUNTER — Encounter: Payer: Medicare HMO | Admitting: Internal Medicine

## 2020-12-03 ENCOUNTER — Other Ambulatory Visit: Payer: Self-pay

## 2020-12-03 DIAGNOSIS — G4700 Insomnia, unspecified: Secondary | ICD-10-CM

## 2020-12-03 MED ORDER — ALENDRONATE SODIUM 70 MG PO TABS: 70.0000 mg | ORAL_TABLET | ORAL | 3 refills | Status: DC

## 2020-12-03 MED ORDER — NORTRIPTYLINE HCL 50 MG PO CAPS: 50.0000 mg | ORAL_CAPSULE | Freq: Every day | ORAL | 1 refills | Status: DC

## 2020-12-03 MED ORDER — CARVEDILOL 6.25 MG PO TABS: 6.2500 mg | ORAL_TABLET | Freq: Two times a day (BID) | ORAL | 1 refills | Status: DC

## 2020-12-06 ENCOUNTER — Telehealth (INDEPENDENT_AMBULATORY_CARE_PROVIDER_SITE_OTHER): Payer: Medicare HMO | Admitting: Family Medicine

## 2020-12-06 ENCOUNTER — Other Ambulatory Visit: Payer: Self-pay

## 2020-12-06 DIAGNOSIS — J209 Acute bronchitis, unspecified: Secondary | ICD-10-CM | POA: Diagnosis not present

## 2020-12-06 MED ORDER — DOXYCYCLINE HYCLATE 100 MG PO CAPS: 100.0000 mg | ORAL_CAPSULE | Freq: Two times a day (BID) | ORAL | 0 refills | Status: DC

## 2020-12-06 NOTE — Progress Notes (Signed)
[facility] [address] [place], [place] [address] [phone]  Date:  12/06/2020   Name:  Devin [name]   DOB:  Dec 31, 1940   MRN:  [id number]  PCP:  Colon Branch, MD    Chief Complaint: No chief complaint on file.   History of Present Illness:  Devin [name] is a 80 y.o. very pleasant male patient who presents with the following:  Primary patient of my partner Dr. Larose Kells, here today for virtual visit with concern of illness Patient location is home, my location is office.  Patient identity confirmed with 2 factors, he gives consent for virtual visit today The patient and myself are present on the call  I have not seen this patient myself previously History of GERD, lumbar compression fracture, BPH, hypertension, history of rectal cancer status post resection in approximately the year 2000  Pt notes he seems to have a bronchial infection- he traveled here from the [place] 8 days ago and has been sick since he arrived Started with "a cold" now progressed to symptoms which seem consistent with bronchitis to the patient.  He notes he has had bronchitis on several occasions in the past with similar symptoms He has not tested for covid since he got sick  No fever No vomiting or diarrhea  No ST or earache He notes his BP was 168/83 person this morning, rechecked 143/ 73 just now  He does not have asthma or allergies   Patient Active Problem List   Diagnosis Date Noted  . Tremor 10/21/2018  . Erectile dysfunction 03/08/2018  . Greater trochanteric bursitis of both hips 12/11/2017  . Back pain   . Compression fracture of L2 ([place]) 06/07/2017  . Insomnia 10/14/2016  . DJD (degenerative joint disease) 10/14/2016  . PCP NOTES >>>>>>>>>>>>>>>>>>>>>>>>> 11/28/2015  . Benign prostatic hyperplasia with urinary obstruction 05/30/2015  . Annual physical exam 11/27/2014  . Essential hypertension 10/02/2014  . Alcohol use disorder, mild,  abuse 10/02/2014  . Bruit 08/09/2014  . GERD (gastroesophageal reflux disease) 05/25/2014  . History of rectal cancer 05/15/2014  . Personal history of colonic polyps 05/15/2014  . Diverticulosis of colon (without mention of hemorrhage) 05/15/2014  . Family history of malignant neoplasm of gastrointestinal tract 05/15/2014  . Anxiety and depression 04/19/2014    Past Medical History:  Diagnosis Date  . Arthritis   . Cataract   . COPD (chronic obstructive pulmonary disease) ([place])   . Diverticulosis    pt states he had diverticuli in his bladder also.  . H/O [facility] spotted fever 01/27/2014  . Hematuria    over 5 years ago  . Hernia of abdominal cavity   . History of rectal polyps   . Hyperplastic colon polyp   . Hypertension   . Macular degeneration   . Rectal cancer ([place]) 1994  . Shoulder pain, left    per pt, he can lay on the left side!  . Tremor 10/21/2018  . Tubular adenoma    01/05/2013    Past Surgical History:  Procedure Laterality Date  . APPENDECTOMY    . COLON SURGERY     rectal cancer ~ 1999  . EYE SURGERY     cataract sx  . HERNIA REPAIR    . IR KYPHO LUMBAR INC FX REDUCE BONE BX UNI/BIL CANNULATION INC/IMAGING  06/12/2017  . IR RADIOLOGIST EVAL & MGMT  08/06/2017  . mastoid gland removal  left ear  . PROSTATE SURGERY  05/2015  . TONSILLECTOMY      Social History   Tobacco Use  . Smoking status: Former Smoker    Quit date: 04/19/1989    Years since quitting: 31.6  . Smokeless tobacco: Never Used  Vaping Use  . Vaping Use: Never used  Substance Use Topics  . Alcohol use: Yes    Alcohol/week: 14.0 standard drinks    Types: 14 Glasses of wine per week    Comment: most days  , amounts varies , sometimes > 2 servings   . Drug use: No    Family History  Problem Relation Age of Onset  . Breast cancer Mother   . Colon cancer Mother 85       died 6  . Aneurysm Father   . Heart attack Father        77  . Aneurysm Son        aorta  .  Stroke Paternal Grandfather   . Cancer Paternal Grandmother   . Cancer Maternal Grandmother   . Prostate cancer Neg Hx   . Esophageal cancer Neg Hx   . Pancreatic cancer Neg Hx   . Rectal cancer Neg Hx   . Stomach cancer Neg Hx     Allergies  Allergen Reactions  . Escitalopram Other (See Comments)    "made me feel drunk"    Medication list has been reviewed and updated.  Current Outpatient Medications on File Prior to Visit  Medication Sig Dispense Refill  . alendronate (FOSAMAX) 70 MG tablet Take 1 tablet (70 mg total) by mouth once a week. Take with a full glass of water. 12 tablet 3  . carvedilol (COREG) 6.25 MG tablet Take 1 tablet (6.25 mg total) by mouth 2 (two) times daily with a meal. 180 tablet 1  . clotrimazole-betamethasone (LOTRISONE) cream APPLY TOPICALLY TWICE DAILY 90 g 0  . fluorouracil (EFUDEX) 5 % cream     . nortriptyline (PAMELOR) 50 MG capsule Take 1 capsule (50 mg total) by mouth at bedtime. 90 capsule 1   No current facility-administered medications on file prior to visit.    Review of Systems:  As per HPI- otherwise negative.   Physical Examination: There were no vitals filed for this visit. There were no vitals filed for this visit. There is no height or weight on file to calculate BMI. Ideal Body Weight:    Pt observed via MyChart video.  He looks well, no cough or distress Temp is normal per patient report Blood pressure as above He is not checking his pulse or oxygen saturation Assessment and Plan: Acute bronchitis, unspecified organism - Plan: doxycycline (VIBRAMYCIN) 100 MG capsule  Virtual visit today for concern of possible bronchitis.  Patient has noted illness which started with URI symptoms, has now developed into a productive cough. Plan to treat with doxycycline 100 twice daily for 10 days.  I did encourage him to do a COVID-19 test to be sure he does not actually have COVID.  He will let us know if he is not feeling better in the  next few days, sooner if worse Video used for entirety of visit today  Signed Lamar Blinks, MD

## 2020-12-07 ENCOUNTER — Encounter: Payer: Self-pay | Admitting: Family Medicine

## 2020-12-13 ENCOUNTER — Other Ambulatory Visit: Payer: Self-pay

## 2020-12-13 ENCOUNTER — Encounter: Payer: Self-pay | Admitting: Internal Medicine

## 2020-12-13 ENCOUNTER — Ambulatory Visit (INDEPENDENT_AMBULATORY_CARE_PROVIDER_SITE_OTHER): Payer: Medicare HMO | Admitting: Internal Medicine

## 2020-12-13 VITALS — BP 138/82 | HR 69 | Temp 97.7°F | Resp 18 | Ht 74.0 in | Wt 210.2 lb

## 2020-12-13 DIAGNOSIS — J449 Chronic obstructive pulmonary disease, unspecified: Secondary | ICD-10-CM | POA: Diagnosis not present

## 2020-12-13 DIAGNOSIS — G4700 Insomnia, unspecified: Secondary | ICD-10-CM

## 2020-12-13 DIAGNOSIS — I10 Essential (primary) hypertension: Secondary | ICD-10-CM | POA: Diagnosis not present

## 2020-12-13 DIAGNOSIS — M25512 Pain in left shoulder: Secondary | ICD-10-CM

## 2020-12-13 DIAGNOSIS — R739 Hyperglycemia, unspecified: Secondary | ICD-10-CM | POA: Diagnosis not present

## 2020-12-13 DIAGNOSIS — Z0000 Encounter for general adult medical examination without abnormal findings: Secondary | ICD-10-CM

## 2020-12-13 DIAGNOSIS — Z1159 Encounter for screening for other viral diseases: Secondary | ICD-10-CM

## 2020-12-13 DIAGNOSIS — F419 Anxiety disorder, unspecified: Secondary | ICD-10-CM | POA: Diagnosis not present

## 2020-12-13 DIAGNOSIS — Z0001 Encounter for general adult medical examination with abnormal findings: Secondary | ICD-10-CM

## 2020-12-13 DIAGNOSIS — G8929 Other chronic pain: Secondary | ICD-10-CM

## 2020-12-13 DIAGNOSIS — F32A Depression, unspecified: Secondary | ICD-10-CM

## 2020-12-13 LAB — COMPREHENSIVE METABOLIC PANEL
ALT: 13 U/L (ref 0–53)
AST: 17 U/L (ref 0–37)
Albumin: 4.2 g/dL (ref 3.5–5.2)
Alkaline Phosphatase: 48 U/L (ref 39–117)
BUN: 16 mg/dL (ref 6–23)
CO2: 28 mEq/L (ref 19–32)
Calcium: 9.7 mg/dL (ref 8.4–10.5)
Chloride: 103 mEq/L (ref 96–112)
Creatinine, Ser: 0.74 mg/dL (ref 0.40–1.50)
GFR: 86.58 mL/min (ref >60.00)
Glucose, Bld: 93 mg/dL (ref 70–99)
Potassium: 4.4 mEq/L (ref 3.5–5.1)
Sodium: 140 mEq/L (ref 135–145)
Total Bilirubin: 0.5 mg/dL (ref 0.2–1.2)
Total Protein: 6.7 g/dL (ref 6.0–8.3)

## 2020-12-13 LAB — CBC WITH DIFFERENTIAL/PLATELET
Basophils Absolute: 0 10*3/uL (ref 0.0–0.1)
Basophils Relative: 0.4 % (ref 0.0–3.0)
Eosinophils Absolute: 0.1 10*3/uL (ref 0.0–0.7)
Eosinophils Relative: 1.3 % (ref 0.0–5.0)
HCT: 40.7 % (ref 39.0–52.0)
Hemoglobin: 13.7 g/dL (ref 13.0–17.0)
Lymphocytes Relative: 34.2 % (ref 12.0–46.0)
Lymphs Abs: 2.2 10*3/uL (ref 0.7–4.0)
MCHC: 33.7 g/dL (ref 30.0–36.0)
MCV: 91.7 fl (ref 78.0–100.0)
Monocytes Absolute: 0.5 10*3/uL (ref 0.1–1.0)
Monocytes Relative: 8 % (ref 3.0–12.0)
Neutro Abs: 3.6 10*3/uL (ref 1.4–7.7)
Neutrophils Relative %: 56.1 % (ref 43.0–77.0)
Platelets: 156 10*3/uL (ref 150.0–400.0)
RBC: 4.44 Mil/uL (ref 4.22–5.81)
RDW: 13.1 % (ref 11.5–15.5)
WBC: 6.4 10*3/uL (ref 4.0–10.5)

## 2020-12-13 LAB — B12 AND FOLATE PANEL
Folate: 9.6 ng/mL (ref >5.9)
Vitamin B-12: 176 pg/mL — ABNORMAL LOW (ref 211–911)

## 2020-12-13 LAB — HEMOGLOBIN A1C: Hgb A1c MFr Bld: 5.6 % (ref 4.6–6.5)

## 2020-12-13 LAB — LIPID PANEL
Cholesterol: 145 mg/dL (ref 0–200)
HDL: 51.7 mg/dL (ref >39.00)
LDL Cholesterol: 73 mg/dL (ref 0–99)
NonHDL: 93.04
Total CHOL/HDL Ratio: 3
Triglycerides: 98 mg/dL (ref 0.0–149.0)
VLDL: 19.6 mg/dL (ref 0.0–40.0)

## 2020-12-13 MED ORDER — KETOCONAZOLE 2 % EX CREA
1.0000 | TOPICAL_CREAM | Freq: Every day | CUTANEOUS | 0 refills | Status: DC
Start: 2020-12-13 — End: 2022-11-03

## 2020-12-13 NOTE — Progress Notes (Signed)
Subjective:    Patient ID: Devin [name], male    DOB: 12/24/1940, 80 y.o.   MRN: [id number]  DOS:  12/13/2020 Type of visit - description: cpx  In addition to CPX he has some concerns.  DJD: Pain is getting worse in general, particularly at the left shoulder, back and hands. No fever, no chills.  No weight loss or headaches.  No amaurosis fugax.  Has a rash behind the ears, has been applying topical steroids.  Tremors, slightly worse but not affecting ADLs.  Review of Systems  Other than above, a 14 point review of systems is negative     Past Medical History:  Diagnosis Date   Arthritis    Cataract    COPD (chronic obstructive pulmonary disease) ([place])    Diverticulosis    pt states he had diverticuli in his bladder also.   H/O [facility] spotted fever 01/27/2014   Hematuria    over 5 years ago   Hernia of abdominal cavity    History of rectal polyps    Hyperplastic colon polyp    Hypertension    Macular degeneration    Rectal cancer ([place]) 1994   Shoulder pain, left    per pt, he can lay on the left side!   Tremor 10/21/2018   Tubular adenoma    01/05/2013    Past Surgical History:  Procedure Laterality Date   APPENDECTOMY     COLON SURGERY     rectal cancer ~ 1999   EYE SURGERY     cataract sx   HERNIA REPAIR     IR KYPHO LUMBAR INC FX REDUCE BONE BX UNI/BIL CANNULATION INC/IMAGING  06/12/2017   IR RADIOLOGIST EVAL & MGMT  08/06/2017   mastoid gland removal     left ear   PROSTATE SURGERY  05/2015   TONSILLECTOMY      Allergies as of 12/13/2020      Reactions   Escitalopram Other (See Comments)   "made me feel drunk"      Medication List       Accurate as of December 13, 2020 11:59 PM. If you have any questions, ask your nurse or doctor.        STOP taking these medications   doxycycline 100 MG capsule Commonly known as: VIBRAMYCIN Stopped by: Kathlene November, MD     TAKE these medications   alendronate 70 MG  tablet Commonly known as: FOSAMAX Take 1 tablet (70 mg total) by mouth once a week. Take with a full glass of water.   carvedilol 6.25 MG tablet Commonly known as: COREG Take 1 tablet (6.25 mg total) by mouth 2 (two) times daily with a meal.   clotrimazole-betamethasone cream Commonly known as: LOTRISONE APPLY TOPICALLY TWICE DAILY   fluorouracil 5 % cream Commonly known as: EFUDEX   ketoconazole 2 % cream Commonly known as: NIZORAL Apply 1 application topically daily. Started by: Kathlene November, MD   nortriptyline 50 MG capsule Commonly known as: PAMELOR Take 1 capsule (50 mg total) by mouth at bedtime.          Objective:   Physical Exam BP 138/82 (BP Location: Left Arm, Patient Position: Sitting, Cuff Size: Small)    Pulse 69    Temp 97.7 F (36.5 C) (Oral)    Resp 18    Ht 6\' 2"  (1.88 m)    Wt 210 lb 4 oz (95.4 kg)    SpO2 95%    BMI 26.99 kg/m  General: Well developed, NAD, BMI noted Neck: No  thyromegaly  HEENT:  Normocephalic . Face symmetric, atraumatic Lungs:  CTA B Normal respiratory effort, no intercostal retractions, no accessory muscle use. Heart: RRR,  no murmur.  Abdomen:  Not distended, soft, non-tender. No rebound or rigidity.   Lower extremities: no pretibial edema bilaterally MSK: Hands and wrists with no synovitis, + changes of DJD. Skin: Exposed areas without rash. Not pale. Not jaundice Neurologic:  alert & oriented X3.  Speech normal, gait appropriate for age and unassisted Strength symmetric and appropriate for age. + Tremors noted, mostly on the right hand Psych: Cognition and judgment appear intact.  Cooperative with normal attention span and concentration.  Behavior appropriate. No anxious or depressed appearing.     Assessment     Assessment HTN Depression-insomnia, lost a son ~2011 GERD sx onset ~2016 MSK: --DJD- hip injection 2017  --L2 vertebral fracture d/t a fall , kyphoplasty 05-2017 --T score 06-2017 -0.2, was rx ca and  vit D.  Started  Fosamax 09-2017 BPH --s/p surgery x2 2016 , Dr Hazle Nordmann H/o etoh, mild  Rectal cancer-- s/p surgery ~ 1999, no chemo-XRT FH CAD, F MI age 44 Former light smoker , No symptoms  PLAN Here for CPX HTN: On carvedilol, reports excellent ambulatory BP readings.   Depression insomnia: On nortriptyline, controlled DJD: Slightly worse, rec to continue Tylenol up to 4 tablets daily, he is very active, encourage to continue to do so.  No evidence of inflammatory arthritis on clinical grounds.  Will refer to Ortho specifically for left shoulder pain.  Declined a PT referral Osteoporosis: On Fosamax Tremors: Slightly worse but not affecting ADLs Rash behind the ears: Currently looks well, has been using topical steroids eczema?  Irritation from wearing the mask? For completeness we will try ketoconazole. RTC 3 months if he has concerns otherwise 1 year  In addition to CPX, we addressed an acute problem (rash), a worsening chronic issue (DJD) as well as his routine medical problems.  This visit occurred during the SARS-CoV-2 public health emergency.  Safety protocols were in place, including screening questions prior to the visit, additional usage of staff PPE, and extensive cleaning of exam room while observing appropriate contact time as indicated for disinfecting solutions.

## 2020-12-13 NOTE — Patient Instructions (Signed)
Check the  blood pressure regularly. BP GOAL is between 110/65 and  135/85. If it is consistently higher or lower, let me know  Bring a new healthcare power of attorney  GO TO THE LAB : Get the blood work     Devin [name], [place] back for a checkup in 3 months if you have any concerns otherwise 1 year

## 2020-12-14 ENCOUNTER — Encounter: Payer: Self-pay | Admitting: Internal Medicine

## 2020-12-14 LAB — HEPATITIS C ANTIBODY
Hepatitis C Ab: NONREACTIVE
SIGNAL TO CUT-OFF: 0 (ref <1.00)

## 2020-12-14 NOTE — Assessment & Plan Note (Signed)
-  Td 2019, pnm 23- 2009 and 09-2017, pnm 13- 2015, zostavax 2009; shingrix x2 -Had a total of 4 COVID vaccines (in [place] in the [place]) -Had a flu shot  -CCS: Cscope 04-2014, + polyps, cscope again 04-2017, next 2023 per cscope report -- Prostate ca screening: sees urology  --Diet and exercise discussed. --Labs: CMP, FLP, CBC, L0B, E67, folic acid.  Hep C. --Will remove his healthcare power of attorney because he is separated from his wife. He will bring a new POA.  Ticket to [employer] sent.

## 2020-12-14 NOTE — Assessment & Plan Note (Signed)
Here for CPX HTN: On carvedilol, reports excellent ambulatory BP readings.   Depression insomnia: On nortriptyline, controlled DJD: Slightly worse, rec to continue Tylenol up to 4 tablets daily, he is very active, encourage to continue to do so.  No evidence of inflammatory arthritis on clinical grounds.  Will refer to Ortho specifically for left shoulder pain.  Declined a PT referral Osteoporosis: On Fosamax Tremors: Slightly worse but not affecting ADLs Rash behind the ears: Currently looks well, has been using topical steroids eczema?  Irritation from wearing the mask? For completeness we will try ketoconazole. RTC 3 months if he has concerns otherwise 1 year

## 2021-01-01 DIAGNOSIS — H353211 Exudative age-related macular degeneration, right eye, with active choroidal neovascularization: Secondary | ICD-10-CM | POA: Diagnosis not present

## 2021-01-01 DIAGNOSIS — H35373 Puckering of macula, bilateral: Secondary | ICD-10-CM | POA: Diagnosis not present

## 2021-01-01 DIAGNOSIS — H35363 Drusen (degenerative) of macula, bilateral: Secondary | ICD-10-CM | POA: Diagnosis not present

## 2021-01-01 DIAGNOSIS — H353122 Nonexudative age-related macular degeneration, left eye, intermediate dry stage: Secondary | ICD-10-CM | POA: Diagnosis not present

## 2021-01-08 DIAGNOSIS — H353211 Exudative age-related macular degeneration, right eye, with active choroidal neovascularization: Secondary | ICD-10-CM | POA: Diagnosis not present

## 2021-01-08 DIAGNOSIS — H353122 Nonexudative age-related macular degeneration, left eye, intermediate dry stage: Secondary | ICD-10-CM | POA: Diagnosis not present

## 2021-02-12 ENCOUNTER — Other Ambulatory Visit: Payer: Self-pay | Admitting: Internal Medicine

## 2021-03-31 ENCOUNTER — Other Ambulatory Visit: Payer: Self-pay | Admitting: Internal Medicine

## 2021-04-22 ENCOUNTER — Other Ambulatory Visit: Payer: Self-pay

## 2021-04-22 ENCOUNTER — Encounter: Payer: Self-pay | Admitting: Family Medicine

## 2021-04-22 ENCOUNTER — Ambulatory Visit (INDEPENDENT_AMBULATORY_CARE_PROVIDER_SITE_OTHER): Payer: Medicare HMO | Admitting: Family Medicine

## 2021-04-22 VITALS — BP 110/78 | HR 90 | Temp 98.6°F | Ht 74.5 in | Wt 209.1 lb

## 2021-04-22 DIAGNOSIS — M25511 Pain in right shoulder: Secondary | ICD-10-CM | POA: Diagnosis not present

## 2021-04-22 DIAGNOSIS — G8929 Other chronic pain: Secondary | ICD-10-CM

## 2021-04-22 DIAGNOSIS — M25512 Pain in left shoulder: Secondary | ICD-10-CM | POA: Diagnosis not present

## 2021-04-22 MED ORDER — METHYLPREDNISOLONE ACETATE 40 MG/ML IJ SUSP
40.0000 mg | Freq: Once | INTRAMUSCULAR | Status: AC
  Administered 2021-04-22: 40 mg via INTRA_ARTICULAR

## 2021-04-22 NOTE — Addendum Note (Signed)
Addended by: Sharon Seller B on: 04/22/2021 03:04 PM   Modules accepted: Orders

## 2021-04-22 NOTE — Progress Notes (Signed)
Musculoskeletal Exam  Patient: Devin [name] DOB: 06-09-1941  DOS: 04/22/2021  SUBJECTIVE:  Chief Complaint:   Chief Complaint  Patient presents with   Shoulder Pain    Devin [name] is a 80 y.o.  male for evaluation and treatment of bilateral shoulder pain.   Onset:  Chronic Location: top of shoulders Character:  aching  Progression of issue:  gotten worse recently Associated symptoms: decreased ROM Denies: Catching/locking, bruising, redness, swelling Treatment: to date has been acetaminophen, home exercises, and had injections in past that lasted for 4-5 mo.   Neurovascular symptoms: no  Past Medical History:  Diagnosis Date   Arthritis    Cataract    COPD (chronic obstructive pulmonary disease) ([place])    Diverticulosis    pt states he had diverticuli in his bladder also.   H/O [facility] spotted fever 01/27/2014   Hematuria    over 5 years ago   Hernia of abdominal cavity    History of rectal polyps    Hyperplastic colon polyp    Hypertension    Macular degeneration    Rectal cancer ([place]) 1994   Shoulder pain, left    per pt, he can lay on the left side!   Tremor 10/21/2018   Tubular adenoma    01/05/2013    Objective: VITAL SIGNS: BP 110/78   Pulse 90   Temp 98.6 F (37 C) (Oral)   Ht 6' 2.5" (1.892 m)   Wt 209 lb 2 oz (94.9 kg)   SpO2 93%   BMI 26.49 kg/m  Constitutional: Well formed, well developed. No acute distress. Thorax & Lungs: No accessory muscle use Musculoskeletal: shoulders.   Normal active range of motion: no.  Decreased L shoulder ROM. Normal passive range of motion: noDecreased L shoulder ROM. Tenderness to palpation: no Deformity: no Ecchymosis: no Tests positive: Neer's on L Tests negative: Neer's on R; cross over, lift off, Hawkins, speed's Neurologic: Normal sensory function. No focal deficits noted. DTR's equal and symmetric in UE's. No clonus. Psychiatric: Normal mood. Age appropriate judgment and insight. Alert & oriented x  3.    Procedure Note; L shoulder joint injection Verbal consent obtained. The area was palpated, an area was marked posterior to the acromion process approximately 2 cm inferiorly, and cleaned with alcohol x1. A 27-gauge needle, while aiming towards the coracoid process, was used to enter the joint posteriorally with ease. 40 mg of Depomedrol with 2 mL of 1% lidocaine was injected. A bandaid was placed.  The patient tolerated the procedure well. There were no complications noted.  Procedure Note; R shoulder bursa injection Verbal consent obtained. The area was palpated, an area was marked just caudal to the acromion process laterally, and cleaned with alcohol x1. A 27-gauge needle was used to enter the joint laterally with ease. 40 mg of Depomedrol with 2 mL of 1% lidocaine was injected. A bandaid The patient tolerated the procedure well. There were no complications noted.   Assessment:  Chronic pain of both shoulders - Plan: PR DRAIN/INJECT LARGE JOINT/BURSA  Plan: Chronic, uncontrolled. Stretches/exercises, heat, ice, Tylenol. I think he has more AC on the L, bursitis/RC involvement on the R.  F/u prn. The patient voiced understanding and agreement to the plan.   [place], DO 04/22/21  2:48 PM

## 2021-04-22 NOTE — Patient Instructions (Addendum)
Heat (pad or rice pillow in microwave) over affected area, 10-15 minutes twice daily.   Ice/cold pack over area for 10-15 min twice daily.  Do your stretches routinely.  Have a great trip.   Let us know if you need anything.

## 2021-04-26 ENCOUNTER — Other Ambulatory Visit: Payer: Self-pay | Admitting: Internal Medicine

## 2021-04-26 DIAGNOSIS — G4700 Insomnia, unspecified: Secondary | ICD-10-CM

## 2021-04-28 NOTE — Progress Notes (Signed)
[facility] [place], [place], [place] [address] [id number] [phone]  Date:  04/29/2021   Name:  Devin [name]   DOB:  Feb 27, 1941   MRN:  [id number]  PCP:  Colon Branch, MD    Chief Complaint: Bilateral Fungal Infection (Bilteral ear fungal infection, one month)   History of Present Illness:  Devin [name] is a 80 y.o. very pleasant male patient who presents with the following:  Primary patient of my partner Dr. Larose Kells, here today with concern about possible ear fungus History of hypertension, GERD, rectal cancer  He notes itching in both ears- he has had this in the past and was treated with some sort of spray in the [place] He wonders if he may have somehow spread a fungal infection to his ears He has noted symptoms for 2 weeks Not painful No change in his hearing No discharge Otherwise feels well Patient Active Problem List   Diagnosis Date Noted   Tremor 10/21/2018   Erectile dysfunction 03/08/2018   Greater trochanteric bursitis of both hips 12/11/2017   Back pain    Compression fracture of L2 ([place]) 06/07/2017   Insomnia 10/14/2016   DJD (degenerative joint disease) 10/14/2016   PCP NOTES >>>>>>>>>>>>>>>>>>>>>>>>> 11/28/2015   Benign prostatic hyperplasia with urinary obstruction 05/30/2015   Annual physical exam 11/27/2014   Essential hypertension 10/02/2014   Alcohol use disorder, mild, abuse 10/02/2014   Bruit 08/09/2014   GERD (gastroesophageal reflux disease) 05/25/2014   History of rectal cancer 05/15/2014   Personal history of colonic polyps 05/15/2014   Diverticulosis of colon (without mention of hemorrhage) 05/15/2014   Family history of malignant neoplasm of gastrointestinal tract 05/15/2014   Anxiety and depression 04/19/2014    Past Medical History:  Diagnosis Date   Arthritis    Cataract    COPD (chronic obstructive pulmonary disease) ([place])    Diverticulosis    pt states he had diverticuli in his  bladder also.   H/O [facility] spotted fever 01/27/2014   Hematuria    over 5 years ago   Hernia of abdominal cavity    History of rectal polyps    Hyperplastic colon polyp    Hypertension    Macular degeneration    Rectal cancer ([place]) 1994   Shoulder pain, left    per pt, he can lay on the left side!   Tremor 10/21/2018   Tubular adenoma    01/05/2013    Past Surgical History:  Procedure Laterality Date   APPENDECTOMY     COLON SURGERY     rectal cancer ~ 1999   EYE SURGERY     cataract sx   HERNIA REPAIR     IR KYPHO LUMBAR INC FX REDUCE BONE BX UNI/BIL CANNULATION INC/IMAGING  06/12/2017   IR RADIOLOGIST EVAL & MGMT  08/06/2017   mastoid gland removal     left ear   PROSTATE SURGERY  05/2015   TONSILLECTOMY      Social History   Tobacco Use   Smoking status: Former    Types: Cigarettes    Quit date: 04/19/1989    Years since quitting: 32.0   Smokeless tobacco: Never  Vaping Use   Vaping Use: Never used  Substance Use Topics   Alcohol use: Yes    Comment: drinking less    Drug use: No    Family History  Problem Relation Age of Onset   Breast cancer  Mother    Colon cancer Mother 24       died 29   Aneurysm Father    Heart attack Father        4   Aneurysm Son        aorta   Stroke Paternal Grandfather    Cancer Paternal Grandmother    Cancer Maternal Grandmother    Prostate cancer Neg Hx    Esophageal cancer Neg Hx    Pancreatic cancer Neg Hx    Rectal cancer Neg Hx    Stomach cancer Neg Hx     Allergies  Allergen Reactions   Escitalopram Other (See Comments)    "made me feel drunk"    Medication list has been reviewed and updated.  Current Outpatient Medications on File Prior to Visit  Medication Sig Dispense Refill   alendronate (FOSAMAX) 70 MG tablet Take 1 tablet (70 mg total) by mouth once a week. Take with a full glass of water. 12 tablet 3   amoxicillin (AMOXIL) 500 MG capsule Take 500 mg by mouth daily.     carvedilol (COREG)  6.25 MG tablet Take 1 tablet (6.25 mg total) by mouth 2 (two) times daily with a meal. 180 tablet 2   clotrimazole-betamethasone (LOTRISONE) cream Apply topically 2 (two) times daily. 90 g 1   fluorouracil (EFUDEX) 5 % cream      ketoconazole (NIZORAL) 2 % cream Apply 1 application topically daily. 30 g 0   nortriptyline (PAMELOR) 50 MG capsule Take 1 capsule (50 mg total) by mouth at bedtime. 90 capsule 2   No current facility-administered medications on file prior to visit.    Review of Systems:  As per HPI- otherwise negative.   Physical Examination: Vitals:   04/29/21 1044  BP: 128/82  Pulse: 73  Resp: 17  Temp: 97.9 F (36.6 C)  SpO2: 97%   Vitals:   04/29/21 1044  Weight: 204 lb (92.5 kg)  Height: '6\' 4"'$  (1.93 m)   Body mass index is 24.83 kg/m. Ideal Body Weight: Weight in (lb) to have BMI = 25: 205  GEN: no acute distress. Normal weight, looks well  HEENT: Atraumatic, Normocephalic.   TM and both ear canals are normal on exam, he does have some earwax in the right ear canal.  No sign of any fungal/mold growth Ears and Nose: No external deformity. CV: RRR, No M/G/R. No JVD. No thrill. No extra heart sounds. PULM: CTA B, no wheezes, crackles, rhonchi. No retractions. No resp. distress. No accessory muscle use. EXTR: No c/c/e PSYCH: Normally interactive. Conversant.    Assessment and Plan: Otitis externa of both ears, unspecified chronicity, unspecified type - Plan: acetic acid-hydrocortisone (VOSOL-HC) OTIC solution Patient seen today with symptoms of otitis externa.  On exam I do not see evidence of a fungal infection, but we will try treating him with acetic acid hydrocortisone drops.  I have asked him to please let us know if he is not feeling better in the next few days, sooner if worse This visit occurred during the SARS-CoV-2 public health emergency.  Safety protocols were in place, including screening questions prior to the visit, additional usage of staff  PPE, and extensive cleaning of exam room while observing appropriate contact time as indicated for disinfecting solutions.   Signed Lamar Blinks, MD

## 2021-04-29 ENCOUNTER — Other Ambulatory Visit: Payer: Self-pay

## 2021-04-29 ENCOUNTER — Encounter: Payer: Self-pay | Admitting: Family Medicine

## 2021-04-29 ENCOUNTER — Ambulatory Visit (INDEPENDENT_AMBULATORY_CARE_PROVIDER_SITE_OTHER): Payer: Medicare HMO | Admitting: Family Medicine

## 2021-04-29 VITALS — BP 128/82 | HR 73 | Temp 97.9°F | Resp 17 | Ht 76.0 in | Wt 204.0 lb

## 2021-04-29 DIAGNOSIS — H6093 Unspecified otitis externa, bilateral: Secondary | ICD-10-CM

## 2021-04-29 MED ORDER — HYDROCORTISONE-ACETIC ACID 1-2 % OT SOLN: 3.0000 [drp] | Freq: Three times a day (TID) | OTIC | 0 refills | Status: DC

## 2021-04-29 NOTE — Patient Instructions (Signed)
Good to see you today! Please use the ear drops as directed for 7 -10 days.  If your symptoms are not much better in the next 3-4 days please alert me- Sooner if worse.

## 2021-05-21 DIAGNOSIS — Z961 Presence of intraocular lens: Secondary | ICD-10-CM | POA: Diagnosis not present

## 2021-05-21 DIAGNOSIS — H353212 Exudative age-related macular degeneration, right eye, with inactive choroidal neovascularization: Secondary | ICD-10-CM | POA: Diagnosis not present

## 2021-05-21 DIAGNOSIS — H353121 Nonexudative age-related macular degeneration, left eye, early dry stage: Secondary | ICD-10-CM | POA: Diagnosis not present

## 2021-05-21 DIAGNOSIS — H35363 Drusen (degenerative) of macula, bilateral: Secondary | ICD-10-CM | POA: Diagnosis not present

## 2021-05-21 DIAGNOSIS — H35373 Puckering of macula, bilateral: Secondary | ICD-10-CM | POA: Diagnosis not present

## 2021-05-21 DIAGNOSIS — H35453 Secondary pigmentary degeneration, bilateral: Secondary | ICD-10-CM | POA: Diagnosis not present

## 2021-05-27 ENCOUNTER — Telehealth: Payer: Self-pay | Admitting: Internal Medicine

## 2021-05-27 NOTE — Telephone Encounter (Signed)
Copied from [place] [id number]. Topic: Medicare AWV >> May 27, 2021  5:47 PM Cher Nakai R wrote: Reason for CRM:  Left message for patient to call back and schedule their Medicare Annual Wellness Visit (AWV) either virtually or by telephone.   Last Medicare AWVS: 05/09/2020  Please schedule at any time with [facility] Nurse Health Advisor. Saturday and Sundays are available to schedule in the month of September.   40-minute appointment   Any questions, please contact me at [phone]

## 2021-06-28 ENCOUNTER — Telehealth: Payer: Self-pay | Admitting: Internal Medicine

## 2021-06-28 NOTE — Telephone Encounter (Signed)
Left message for patient to call back and schedule Medicare Annual Wellness Visit (AWV) in office.   If not able to come in office, please offer to do virtually or by telephone.  Left office number and my jabber [phone].  Last AWV:05/09/2020  Please schedule at anytime with Nurse Health Advisor.

## 2021-08-20 ENCOUNTER — Encounter: Payer: Self-pay | Admitting: Internal Medicine

## 2021-08-21 IMAGING — US US ABDOMEN COMPLETE
1 series · 13 of 25 positions shown · non-contrast
Comparison: CT Abdomen and Pelvis 02/15/2019.

CLINICAL DATA: 78-year-old male with history of colon cancer and
surgery. "Splenomegaly".

EXAM:
ABDOMEN ULTRASOUND COMPLETE

[Series 1: us abdomen complete · 13 of 107 slices shown]
[im 1/107]
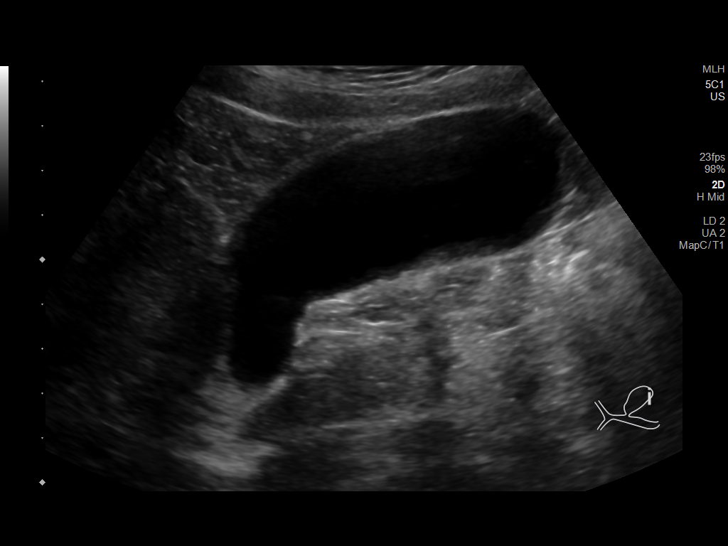
[im 9/107]
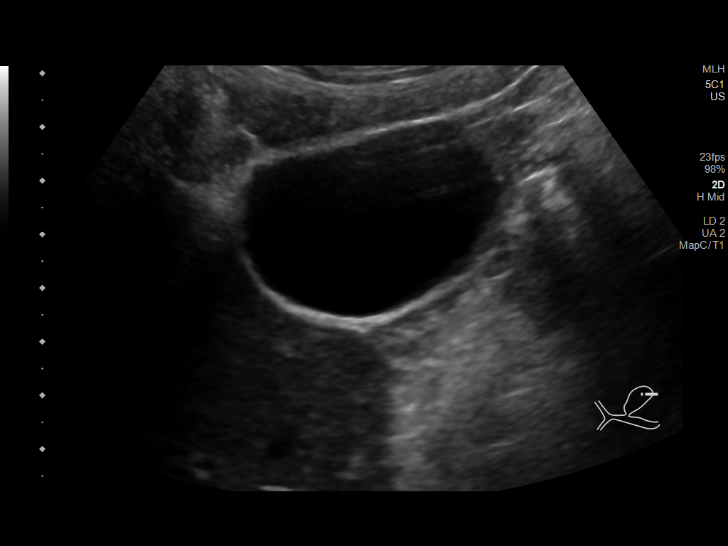
[im 18/107]
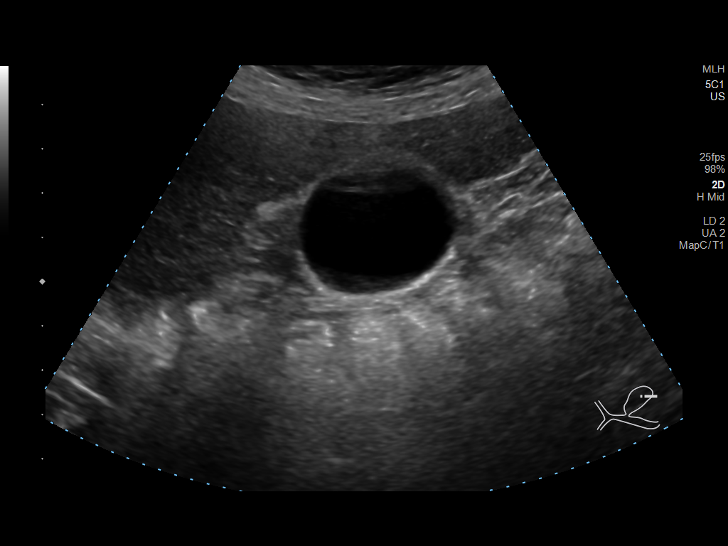
[im 27/107]
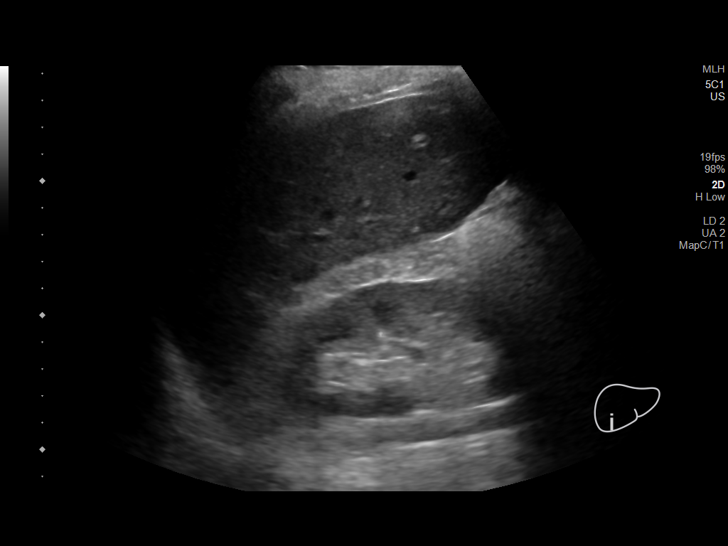
[im 36/107]
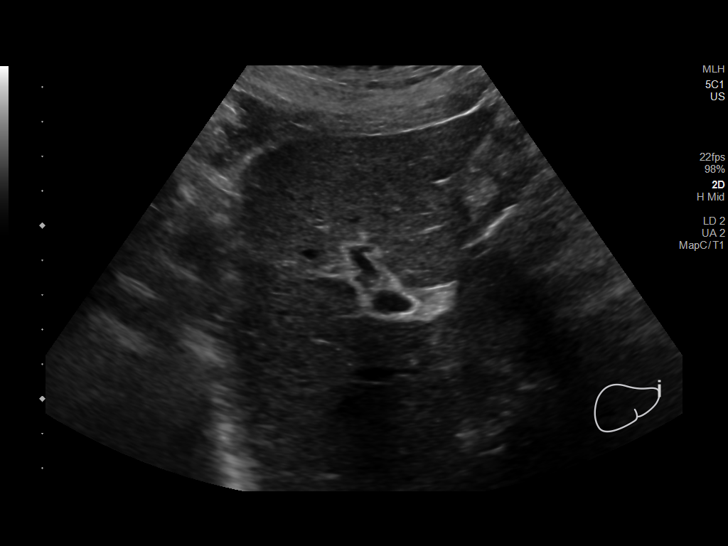
[im 45/107]
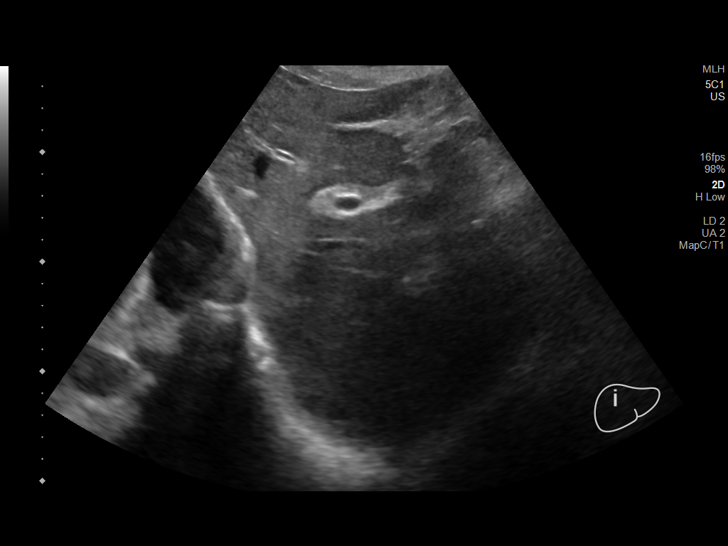
[im 54/107]
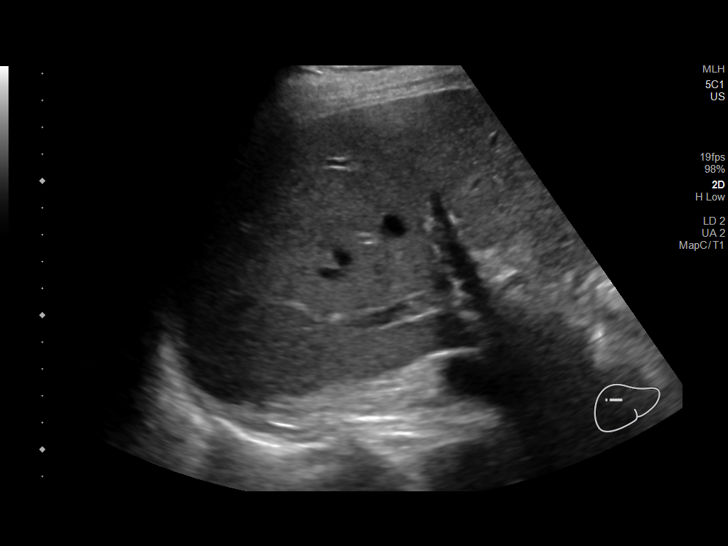
[im 62/107]
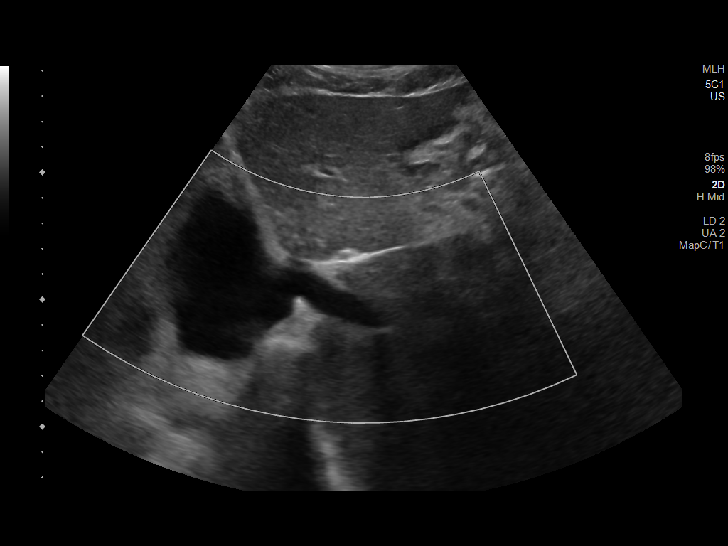
[im 71/107]
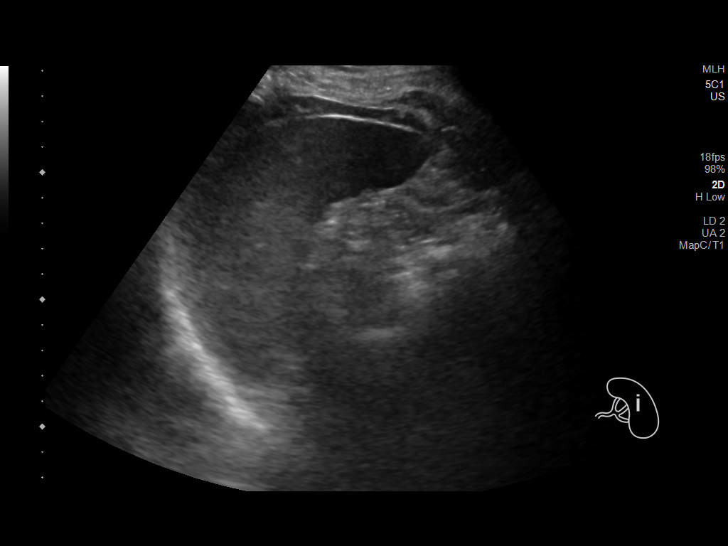
[im 80/107]
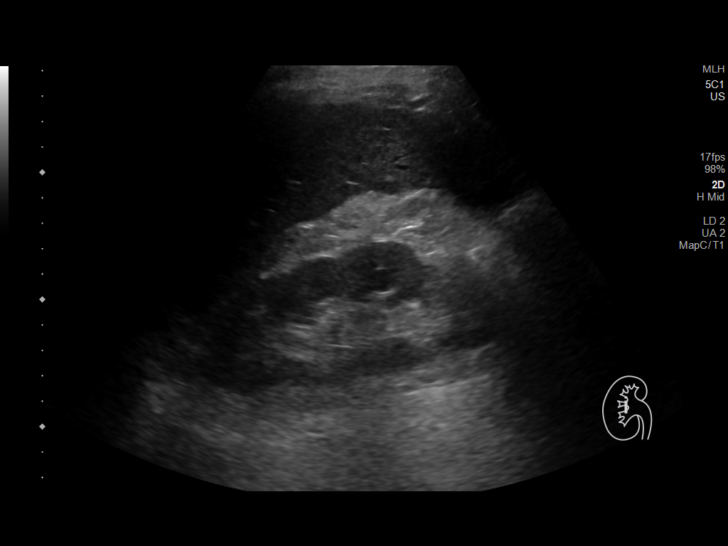
[im 89/107]
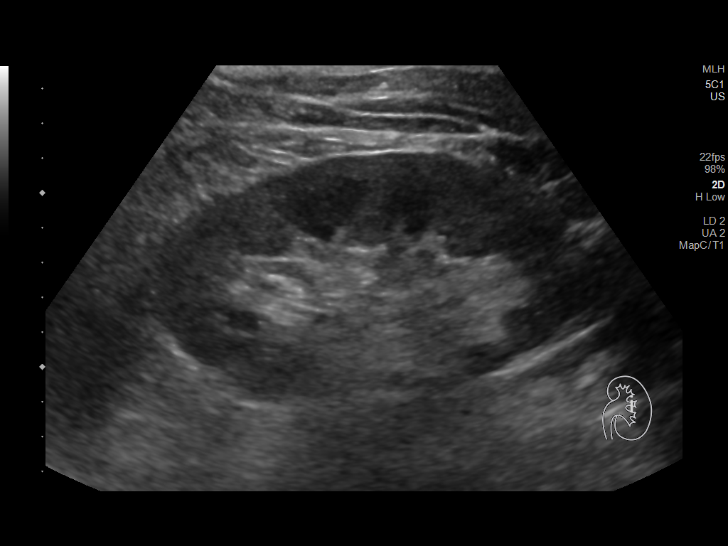
[im 98/107]
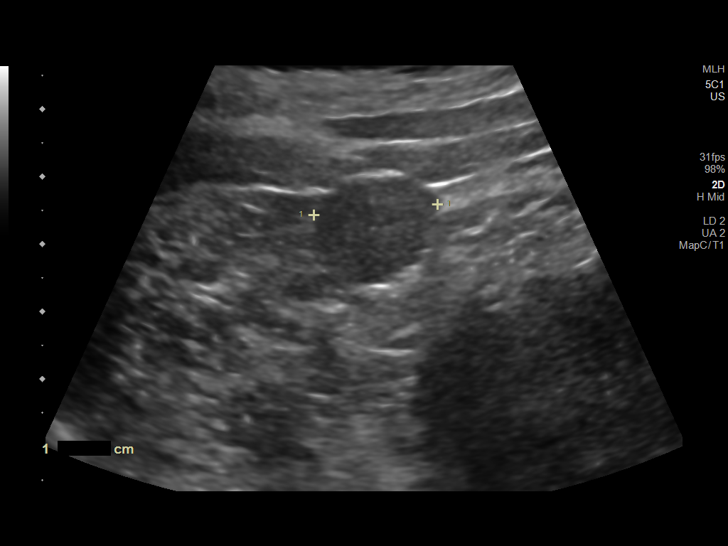
[im 107/107]
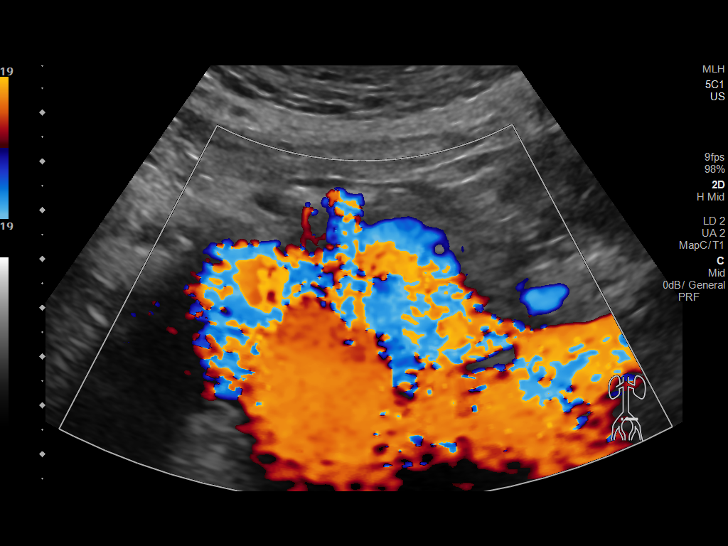

[13 of 25 positions shown; findings below may reference images not displayed]

FINDINGS: Gallbladder: Mildly distended, but no gallstones or wall thickening
visualized. No sonographic Murphy sign noted by sonographer.

Common bile duct: Diameter: 4-5 mm, normal.

Liver: No focal lesion identified. Within normal limits in
parenchymal echogenicity. Portal vein is patent on color Doppler
imaging with normal direction of blood flow towards the liver.

IVC: No abnormality visualized.

Pancreas: Visualized portion unremarkable.

Spleen: Spleen length is stable from the CT last year within normal
limits, up to 11.8 cm. Estimated splenic volume 397 mL (normal
splenic volume range 83 - 412 mL). Splenic echotexture appears
normal. No perisplenic fluid.

There is a small adjacent splenule, 21 mm diameter (image 96) which
is a normal variant and unchanged from 2727.

Right Kidney: Length: 11.3 cm. Echogenicity within normal limits. No
mass or hydronephrosis visualized.

Left Kidney: Length: 12.9 cm. Echogenicity within normal limits. No
mass or hydronephrosis visualized.

Abdominal aorta: No aneurysm visualized.

Other findings: None.
IMPRESSION: 1. Negative for splenomegaly. Spleen appears stable and normal
compared to the 2727 CT Abdomen and Pelvis.
2. Normal abdominal ultrasound.

## 2021-10-23 ENCOUNTER — Other Ambulatory Visit: Payer: Self-pay | Admitting: Internal Medicine

## 2021-10-25 ENCOUNTER — Telehealth: Payer: Self-pay | Admitting: Nurse Practitioner

## 2021-10-25 ENCOUNTER — Encounter: Payer: Self-pay | Admitting: Internal Medicine

## 2021-10-25 ENCOUNTER — Ambulatory Visit (INDEPENDENT_AMBULATORY_CARE_PROVIDER_SITE_OTHER): Payer: Medicare HMO | Admitting: Internal Medicine

## 2021-10-25 VITALS — BP 132/82 | HR 60 | Temp 98.0°F | Resp 16 | Ht 76.0 in | Wt 203.2 lb

## 2021-10-25 DIAGNOSIS — R739 Hyperglycemia, unspecified: Secondary | ICD-10-CM | POA: Diagnosis not present

## 2021-10-25 DIAGNOSIS — G8929 Other chronic pain: Secondary | ICD-10-CM | POA: Diagnosis not present

## 2021-10-25 DIAGNOSIS — R251 Tremor, unspecified: Secondary | ICD-10-CM

## 2021-10-25 DIAGNOSIS — Z0001 Encounter for general adult medical examination with abnormal findings: Secondary | ICD-10-CM

## 2021-10-25 DIAGNOSIS — M25512 Pain in left shoulder: Secondary | ICD-10-CM | POA: Diagnosis not present

## 2021-10-25 DIAGNOSIS — I10 Essential (primary) hypertension: Secondary | ICD-10-CM | POA: Diagnosis not present

## 2021-10-25 DIAGNOSIS — Z1211 Encounter for screening for malignant neoplasm of colon: Secondary | ICD-10-CM | POA: Diagnosis not present

## 2021-10-25 DIAGNOSIS — R202 Paresthesia of skin: Secondary | ICD-10-CM | POA: Diagnosis not present

## 2021-10-25 DIAGNOSIS — Z0000 Encounter for general adult medical examination without abnormal findings: Secondary | ICD-10-CM

## 2021-10-25 LAB — CBC WITH DIFFERENTIAL/PLATELET
Basophils Absolute: 0 10*3/uL (ref 0.0–0.1)
Basophils Relative: 0.3 % (ref 0.0–3.0)
Eosinophils Absolute: 0.1 10*3/uL (ref 0.0–0.7)
Eosinophils Relative: 1.7 % (ref 0.0–5.0)
HCT: 44.6 % (ref 39.0–52.0)
Hemoglobin: 14.4 g/dL (ref 13.0–17.0)
Lymphocytes Relative: 36.9 % (ref 12.0–46.0)
Lymphs Abs: 2.2 10*3/uL (ref 0.7–4.0)
MCHC: 32.3 g/dL (ref 30.0–36.0)
MCV: 93.6 fl (ref 78.0–100.0)
Monocytes Absolute: 0.6 10*3/uL (ref 0.1–1.0)
Monocytes Relative: 9.9 % (ref 3.0–12.0)
Neutro Abs: 3 10*3/uL (ref 1.4–7.7)
Neutrophils Relative %: 51.2 % (ref 43.0–77.0)
Platelets: 137 10*3/uL — ABNORMAL LOW (ref 150.0–400.0)
RBC: 4.77 Mil/uL (ref 4.22–5.81)
RDW: 14.5 % (ref 11.5–15.5)
WBC: 5.9 10*3/uL (ref 4.0–10.5)

## 2021-10-25 LAB — COMPREHENSIVE METABOLIC PANEL
ALT: 14 U/L (ref 0–53)
AST: 17 U/L (ref 0–37)
Albumin: 4.2 g/dL (ref 3.5–5.2)
Alkaline Phosphatase: 52 U/L (ref 39–117)
BUN: 14 mg/dL (ref 6–23)
CO2: 31 mEq/L (ref 19–32)
Calcium: 9.6 mg/dL (ref 8.4–10.5)
Chloride: 105 mEq/L (ref 96–112)
Creatinine, Ser: 0.81 mg/dL (ref 0.40–1.50)
GFR: 83.74 mL/min (ref >60.00)
Glucose, Bld: 90 mg/dL (ref 70–99)
Potassium: 4.1 mEq/L (ref 3.5–5.1)
Sodium: 141 mEq/L (ref 135–145)
Total Bilirubin: 0.6 mg/dL (ref 0.2–1.2)
Total Protein: 6.6 g/dL (ref 6.0–8.3)

## 2021-10-25 LAB — LIPID PANEL
Cholesterol: 154 mg/dL (ref 0–200)
HDL: 65.3 mg/dL (ref >39.00)
LDL Cholesterol: 69 mg/dL (ref 0–99)
NonHDL: 88.36
Total CHOL/HDL Ratio: 2
Triglycerides: 97 mg/dL (ref 0.0–149.0)
VLDL: 19.4 mg/dL (ref 0.0–40.0)

## 2021-10-25 LAB — TSH: TSH: 3.1 u[IU]/mL (ref 0.35–5.50)

## 2021-10-25 MED ORDER — ALENDRONATE SODIUM 70 MG PO TABS: 70.0000 mg | ORAL_TABLET | ORAL | 3 refills | Status: DC

## 2021-10-25 NOTE — Assessment & Plan Note (Signed)
Here for CPX HTN: Well-controlled, continue carvedilol. Depression, insomnia: Uses nortriptyline as needed Osteoporosis: Last T score was -0.3 (April 2021), history of L2 vertebral fracture and kyphoplasty, started Fosamax 2019, holiday by 2024 DJD: Refer to Ortho due to left shoulder pain.  He uses NSAIDs carefully, recommend to also use Tylenol. FH CAD: Last LDL 73, not on statins, has a healthy lifestyle. Tremors, paresthesias from the waist down: Patient report tremors and below-waist paresthesias, exam is benign.  Unclear the meaning of the paresthesia from the waist down, does have chronic pain from L2 vertebral fracture (2018).  We discussed the potential referral but he is not interested at this point. RTC 3 months to reassess DJD, tremors etc.  (Patient reports he likely will be back to [place] by then).

## 2021-10-25 NOTE — Telephone Encounter (Signed)
Patient called requesting to schedule his recall colonoscopy prior to his recall date in 8/23 because [place] will be [place] and he said Dr. Larose Kells agreed with it. I advised the patient that he would need to discuss changing recall date with Dr. Loletha Carrow. He then asked for Dr. Loletha Carrow to e-mail or call him to discuss further.

## 2021-10-25 NOTE — Telephone Encounter (Signed)
Attempted to reach patient twice. His vm is currently full and not able to accept any new messages at this time.

## 2021-10-25 NOTE — Progress Notes (Signed)
Subjective:    Patient ID: Devin [name], male    DOB: Jan 30, 1941, 81 y.o.   MRN: [id number]  DOS:  10/25/2021 Type of visit - description: CPX Patient is visiting from [place]. Here for CPX. Also reports other issues: DJD: Having aches and pains at different joints at different places including thumbs, knees, hips.  The main thing however is a persistent left shoulder pain. It is affecting his QOL, he is still able to take walks.  Essential tremor: More noticeable lately, R>L hand, right foot.  Also, from the waist down, has a uneasy feeling under the skin, mostly at night, bilateral, not really a pain or itching just some unusual feeling.  Review of Systems Denies chest pain or difficulty breathing. Urinary stream is sometimes  low but denies dysuria or gross hematuria.  Other than above, a 14 point review of systems is negative      Past Medical History:  Diagnosis Date   Arthritis    Cataract    COPD (chronic obstructive pulmonary disease) ([place])    Diverticulosis    pt states he had diverticuli in his bladder also.   H/O [facility] spotted fever 01/27/2014   Hematuria    over 5 years ago   Hernia of abdominal cavity    History of rectal polyps    Hyperplastic colon polyp    Hypertension    Macular degeneration    Rectal cancer ([place]) 1994   Shoulder pain, left    per pt, he can lay on the left side!   Tremor 10/21/2018   Tubular adenoma    01/05/2013    Past Surgical History:  Procedure Laterality Date   APPENDECTOMY     COLON SURGERY     rectal cancer ~ 29-Dec-1997   EYE SURGERY     cataract sx   HERNIA REPAIR     IR KYPHO LUMBAR INC FX REDUCE BONE BX UNI/BIL CANNULATION INC/IMAGING  06/12/2017   IR RADIOLOGIST EVAL & MGMT  08/06/2017   mastoid gland removal     left ear   PROSTATE SURGERY  05/2015   TONSILLECTOMY     Social History   Socioeconomic History   Marital status: Legally Separated    Spouse name:     Number of children: 2   Years of education:  Not on file   Highest education level: Not on file  Occupational History   Occupation: Retired- Pharmacologist  Tobacco Use   Smoking status: Former    Types: Cigarettes    Quit date: 04/19/1989    Years since quitting: 32.5   Smokeless tobacco: Never  Vaping Use   Vaping Use: Never used  Substance and Sexual Activity   Alcohol use: Yes    Comment: 2 glasses wine qhs   Drug use: No   Sexual activity: Not Currently  Other Topics Concern   Not on file  Social History Narrative   Moved from [place] ~ Dec 29, 2009   Youngest son lives in [place] reason why he moved here        Separated from wife 09/2018, thinking about going back to [place]       For former wife (now separated)  this is her second marriage.   She lost her first husband.   Aaron Edelman adopted their son  [name] who died in 2009/12/29, he was an alcoholic.   They have together  one biological son.  He lives in [place].      Right handed  Occasional caffeine usage     Social Determinants of Radio broadcast assistant Strain: Not on file  Food Insecurity: Not on file  Transportation Needs: Not on file  Physical Activity: Not on file  Stress: Not on file  Social Connections: Not on file  Intimate Partner Violence: Not on file     Current Outpatient Medications  Medication Instructions   acetic acid-hydrocortisone (VOSOL-HC) OTIC solution 3 drops, Both EARS, 3 times daily, Use for 7- 10 days   alendronate (FOSAMAX) 70 mg, Oral, Weekly, Take with a full glass of water.   amoxicillin (AMOXIL) 500 mg, Daily   carvedilol (COREG) 6.25 mg, Oral, 2 times daily with meals   clotrimazole-betamethasone (LOTRISONE) cream Topical, 2 times daily   fluorouracil (EFUDEX) 5 % cream No dose, route, or frequency recorded.   ketoconazole (NIZORAL) 2 % cream 1 application, Topical, Daily   nortriptyline (PAMELOR) 50 mg, Oral, Daily at bedtime       Objective:   Physical Exam BP 132/82 (BP Location: Left Arm, Patient Position: Sitting,  Cuff Size: Small)    Pulse 60    Temp 98 F (36.7 C) (Oral)    Resp 16    Ht 6\' 4"  (1.93 m)    Wt 203 lb 4 oz (92.2 kg)    SpO2 98%    BMI 24.74 kg/m  General: Well developed, NAD, BMI noted Neck: No  thyromegaly  HEENT:  Normocephalic . Face symmetric, atraumatic Lungs:  CTA B Normal respiratory effort, no intercostal retractions, no accessory muscle use. Heart: RRR,  no murmur.  Abdomen:  Not distended, soft, non-tender. No rebound or rigidity.   Lower extremities: no pretibial edema bilaterally  Skin: Exposed areas without rash. Not pale. Not jaundice Neurologic:  alert & oriented X3.  Speech normal, gait appropriate for age and unassisted Strength symmetric and appropriate for age. Motor exam: Mild intentional tremor DTRs symmetric, slightly decreased ankles jerk  psych: Cognition and judgment appear intact.  Cooperative with normal attention span and concentration.  Behavior appropriate. No anxious or depressed appearing.     Assessment    Assessment HTN Depression-insomnia, lost a son ~2011 GERD sx onset ~2016 MSK: --DJD- hip injection 2017  --L2 vertebral fracture d/t a fall , kyphoplasty 05-2017 --T score 06-2017 -0.2, was rx ca and vit D.  Started  Fosamax 09-2017 BPH --s/p surgery x2 2016 , Dr Hazle Nordmann H/o etoh, mild  Rectal cancer-- s/p surgery ~ 1999, no chemo-XRT FH CAD, F MI age 26 Former light smoker , No symptoms  PLAN Here for CPX HTN: Well-controlled, continue carvedilol. Depression, insomnia: Uses nortriptyline as needed Osteoporosis: Last T score was -0.3 (April 2021), history of L2 vertebral fracture and kyphoplasty, started Fosamax 2019, holiday by 2024 DJD: Refer to Ortho due to left shoulder pain.  He uses NSAIDs carefully, recommend to also use Tylenol. FH CAD: Last LDL 73, not on statins, has a healthy lifestyle. Tremors, paresthesias from the waist down: Patient report tremors and below-waist paresthesias, exam is benign.  Unclear the  meaning of the paresthesia from the waist down, does have chronic pain from L2 vertebral fracture (2018).  We discussed the potential referral but he is not interested at this point. RTC 3 months to reassess DJD, tremors etc.  (Patient reports he likely will be back to [place] by then).   In addition to CPX, we addressed his chronic medical problems and also left shoulder pain and paresthesias.  this visit occurred during the SARS-CoV-2 public  health emergency.  Safety protocols were in place, including screening questions prior to the visit, additional usage of staff PPE, and extensive cleaning of exam room while observing appropriate contact time as indicated for disinfecting solutions.

## 2021-10-25 NOTE — Patient Instructions (Addendum)
We are referring you to the orthopedic doctor  GO TO THE LAB : Get the blood work     [place], [place] back for a checkup in 3 months

## 2021-10-25 NOTE — Assessment & Plan Note (Signed)
-  Td 2019. - pnm 23: 2009 and 09-2017, pnm 13- 2015 - s/p  zostavax 2009; shingrix x2 - COVID vaccines  >> UTD -Had a flu shot  -CCS: Cscope 04-2014, + polyps, cscope again 04-2017, next 2023: GI referral sent. -- Prostate ca screening: h/p BPH , s/p surgery x2, per pt at [facility] urology  rec to f/u prn, currently w/  minimal sxs  --Diet and exercise discussed. --Labs:  CMP, FLP, CBC, TSH --POA: the POA is still his wife, they are currently separated, will ask IT to remove the POA document from the chart at patient's request.

## 2021-10-29 ENCOUNTER — Encounter: Payer: Self-pay | Admitting: Gastroenterology

## 2021-10-29 NOTE — Telephone Encounter (Signed)
I will send him a portal message  - HD

## 2021-10-29 NOTE — Telephone Encounter (Signed)
Noted, thanks!

## 2021-10-30 NOTE — Telephone Encounter (Signed)
Thank you, the patient and I have a portal message stream going about it.  HD

## 2021-10-30 NOTE — Telephone Encounter (Signed)
Inbound call from patient states he would like to have procedure done March, 2023

## 2021-10-31 DIAGNOSIS — G4700 Insomnia, unspecified: Secondary | ICD-10-CM | POA: Diagnosis not present

## 2021-10-31 DIAGNOSIS — Z85048 Personal history of other malignant neoplasm of rectum, rectosigmoid junction, and anus: Secondary | ICD-10-CM | POA: Diagnosis not present

## 2021-10-31 DIAGNOSIS — I10 Essential (primary) hypertension: Secondary | ICD-10-CM | POA: Diagnosis not present

## 2021-10-31 DIAGNOSIS — M19012 Primary osteoarthritis, left shoulder: Secondary | ICD-10-CM | POA: Diagnosis not present

## 2021-10-31 DIAGNOSIS — G8929 Other chronic pain: Secondary | ICD-10-CM | POA: Diagnosis not present

## 2021-10-31 DIAGNOSIS — M25512 Pain in left shoulder: Secondary | ICD-10-CM | POA: Diagnosis not present

## 2021-10-31 DIAGNOSIS — M810 Age-related osteoporosis without current pathological fracture: Secondary | ICD-10-CM | POA: Diagnosis not present

## 2021-10-31 DIAGNOSIS — K219 Gastro-esophageal reflux disease without esophagitis: Secondary | ICD-10-CM | POA: Diagnosis not present

## 2021-10-31 DIAGNOSIS — J449 Chronic obstructive pulmonary disease, unspecified: Secondary | ICD-10-CM | POA: Diagnosis not present

## 2021-10-31 DIAGNOSIS — F32A Depression, unspecified: Secondary | ICD-10-CM | POA: Diagnosis not present

## 2021-10-31 NOTE — Telephone Encounter (Signed)
Understood.  He is 64 and I have not seen him in years, so he needs a clinic visit with me to assess overall health in person and plan the colonoscopy.  Looks like I have some clinic time available next week.  Thx  - HD

## 2021-11-20 DIAGNOSIS — M25512 Pain in left shoulder: Secondary | ICD-10-CM | POA: Diagnosis not present

## 2021-11-20 DIAGNOSIS — M19112 Post-traumatic osteoarthritis, left shoulder: Secondary | ICD-10-CM | POA: Diagnosis not present

## 2021-11-20 DIAGNOSIS — G8929 Other chronic pain: Secondary | ICD-10-CM | POA: Diagnosis not present

## 2021-11-21 DIAGNOSIS — M19112 Post-traumatic osteoarthritis, left shoulder: Secondary | ICD-10-CM | POA: Diagnosis not present

## 2021-11-22 ENCOUNTER — Telehealth: Payer: Self-pay | Admitting: Internal Medicine

## 2021-11-22 NOTE — Telephone Encounter (Signed)
Left message for patient to call back and schedule Medicare Annual Wellness Visit (AWV) in office.  ° °If not able to come in office, please offer to do virtually or by telephone.  Left office number and my jabber #[phone]. ° °Due for AWVI ° °Please schedule at anytime with Nurse Health Advisor. °  °

## 2021-12-02 DIAGNOSIS — Z01818 Encounter for other preprocedural examination: Secondary | ICD-10-CM | POA: Diagnosis not present

## 2021-12-02 DIAGNOSIS — F32A Depression, unspecified: Secondary | ICD-10-CM | POA: Diagnosis not present

## 2021-12-02 DIAGNOSIS — N138 Other obstructive and reflux uropathy: Secondary | ICD-10-CM | POA: Diagnosis not present

## 2021-12-02 DIAGNOSIS — Z125 Encounter for screening for malignant neoplasm of prostate: Secondary | ICD-10-CM | POA: Diagnosis not present

## 2021-12-02 DIAGNOSIS — J449 Chronic obstructive pulmonary disease, unspecified: Secondary | ICD-10-CM | POA: Diagnosis not present

## 2021-12-02 DIAGNOSIS — M810 Age-related osteoporosis without current pathological fracture: Secondary | ICD-10-CM | POA: Diagnosis not present

## 2021-12-02 DIAGNOSIS — N401 Enlarged prostate with lower urinary tract symptoms: Secondary | ICD-10-CM | POA: Diagnosis not present

## 2021-12-02 DIAGNOSIS — I10 Essential (primary) hypertension: Secondary | ICD-10-CM | POA: Diagnosis not present

## 2021-12-02 DIAGNOSIS — Z0000 Encounter for general adult medical examination without abnormal findings: Secondary | ICD-10-CM | POA: Diagnosis not present

## 2021-12-02 DIAGNOSIS — G4700 Insomnia, unspecified: Secondary | ICD-10-CM | POA: Diagnosis not present

## 2021-12-03 DIAGNOSIS — I4510 Unspecified right bundle-branch block: Secondary | ICD-10-CM | POA: Diagnosis not present

## 2021-12-03 DIAGNOSIS — Z13820 Encounter for screening for osteoporosis: Secondary | ICD-10-CM | POA: Diagnosis not present

## 2021-12-03 DIAGNOSIS — M810 Age-related osteoporosis without current pathological fracture: Secondary | ICD-10-CM | POA: Diagnosis not present

## 2022-05-31 ENCOUNTER — Other Ambulatory Visit: Payer: Self-pay | Admitting: Internal Medicine

## 2022-05-31 DIAGNOSIS — G4700 Insomnia, unspecified: Secondary | ICD-10-CM

## 2022-06-17 ENCOUNTER — Encounter: Payer: Self-pay | Admitting: Gastroenterology

## 2022-09-09 DIAGNOSIS — Z01818 Encounter for other preprocedural examination: Secondary | ICD-10-CM | POA: Diagnosis not present

## 2022-09-09 DIAGNOSIS — R0781 Pleurodynia: Secondary | ICD-10-CM | POA: Diagnosis not present

## 2022-09-09 DIAGNOSIS — M19112 Post-traumatic osteoarthritis, left shoulder: Secondary | ICD-10-CM | POA: Diagnosis not present

## 2022-09-09 DIAGNOSIS — G20A1 Parkinson's disease without dyskinesia, without mention of fluctuations: Secondary | ICD-10-CM | POA: Diagnosis not present

## 2022-09-09 DIAGNOSIS — R918 Other nonspecific abnormal finding of lung field: Secondary | ICD-10-CM | POA: Diagnosis not present

## 2022-09-09 DIAGNOSIS — W19XXXA Unspecified fall, initial encounter: Secondary | ICD-10-CM | POA: Diagnosis not present

## 2022-09-11 ENCOUNTER — Telehealth: Payer: Self-pay | Admitting: Internal Medicine

## 2022-09-11 NOTE — Telephone Encounter (Signed)
Patient said he has come back from [place] and wants to see Dr. Larose Kells to discuss his parkinson's medications. Schedule full. Please advise patient if possible to work him in and where.

## 2022-09-12 NOTE — Telephone Encounter (Signed)
Please advise 

## 2022-09-12 NOTE — Telephone Encounter (Signed)
LVM to advise patient should call Dr. Jenny Reichmann at [facility] for further refills

## 2022-09-12 NOTE — Telephone Encounter (Signed)
Rec to schedule an appointment w/ me at his convenience

## 2022-09-12 NOTE — Telephone Encounter (Signed)
Pt states that dr did not work out and he would like to reestablish with Dr.Paz. please advise.

## 2022-09-12 NOTE — Telephone Encounter (Signed)
Lvm2 sched  

## 2022-09-12 NOTE — Telephone Encounter (Signed)
Looks like Pt established w/ a [facility] office- recommend that he see that provider.

## 2022-09-17 DIAGNOSIS — M19112 Post-traumatic osteoarthritis, left shoulder: Secondary | ICD-10-CM | POA: Diagnosis not present

## 2022-10-01 ENCOUNTER — Other Ambulatory Visit: Payer: Self-pay | Admitting: Internal Medicine

## 2022-10-03 ENCOUNTER — Encounter: Payer: Self-pay | Admitting: Internal Medicine

## 2022-10-03 ENCOUNTER — Ambulatory Visit (INDEPENDENT_AMBULATORY_CARE_PROVIDER_SITE_OTHER): Payer: Medicare HMO | Admitting: Internal Medicine

## 2022-10-03 VITALS — BP 126/74 | HR 64 | Temp 97.7°F | Resp 18 | Ht 76.0 in | Wt 205.4 lb

## 2022-10-03 DIAGNOSIS — I10 Essential (primary) hypertension: Secondary | ICD-10-CM

## 2022-10-03 DIAGNOSIS — R739 Hyperglycemia, unspecified: Secondary | ICD-10-CM | POA: Diagnosis not present

## 2022-10-03 DIAGNOSIS — R296 Repeated falls: Secondary | ICD-10-CM

## 2022-10-03 DIAGNOSIS — R1310 Dysphagia, unspecified: Secondary | ICD-10-CM | POA: Diagnosis not present

## 2022-10-03 DIAGNOSIS — R251 Tremor, unspecified: Secondary | ICD-10-CM | POA: Diagnosis not present

## 2022-10-03 DIAGNOSIS — Z23 Encounter for immunization: Secondary | ICD-10-CM | POA: Diagnosis not present

## 2022-10-03 DIAGNOSIS — E538 Deficiency of other specified B group vitamins: Secondary | ICD-10-CM

## 2022-10-03 LAB — CBC WITH DIFFERENTIAL/PLATELET
Basophils Absolute: 0 10*3/uL (ref 0.0–0.1)
Basophils Relative: 0.3 % (ref 0.0–3.0)
Eosinophils Absolute: 0.1 10*3/uL (ref 0.0–0.7)
Eosinophils Relative: 1.5 % (ref 0.0–5.0)
HCT: 43.4 % (ref 39.0–52.0)
Hemoglobin: 14.7 g/dL (ref 13.0–17.0)
Lymphocytes Relative: 36.2 % (ref 12.0–46.0)
Lymphs Abs: 2.2 10*3/uL (ref 0.7–4.0)
MCHC: 33.9 g/dL (ref 30.0–36.0)
MCV: 92.4 fl (ref 78.0–100.0)
Monocytes Absolute: 0.5 10*3/uL (ref 0.1–1.0)
Monocytes Relative: 8.4 % (ref 3.0–12.0)
Neutro Abs: 3.3 10*3/uL (ref 1.4–7.7)
Neutrophils Relative %: 53.6 % (ref 43.0–77.0)
Platelets: 168 10*3/uL (ref 150.0–400.0)
RBC: 4.7 Mil/uL (ref 4.22–5.81)
RDW: 13.5 % (ref 11.5–15.5)
WBC: 6.1 10*3/uL (ref 4.0–10.5)

## 2022-10-03 LAB — COMPREHENSIVE METABOLIC PANEL
ALT: 11 U/L (ref 0–53)
AST: 17 U/L (ref 0–37)
Albumin: 4.2 g/dL (ref 3.5–5.2)
Alkaline Phosphatase: 67 U/L (ref 39–117)
BUN: 15 mg/dL (ref 6–23)
CO2: 30 mEq/L (ref 19–32)
Calcium: 9.4 mg/dL (ref 8.4–10.5)
Chloride: 104 mEq/L (ref 96–112)
Creatinine, Ser: 0.78 mg/dL (ref 0.40–1.50)
GFR: 84.14 mL/min (ref >60.00)
Glucose, Bld: 99 mg/dL (ref 70–99)
Potassium: 4.2 mEq/L (ref 3.5–5.1)
Sodium: 140 mEq/L (ref 135–145)
Total Bilirubin: 0.4 mg/dL (ref 0.2–1.2)
Total Protein: 6.6 g/dL (ref 6.0–8.3)

## 2022-10-03 LAB — TSH: TSH: 2.5 u[IU]/mL (ref 0.35–5.50)

## 2022-10-03 LAB — LIPID PANEL
Cholesterol: 150 mg/dL (ref 0–200)
HDL: 57.1 mg/dL (ref >39.00)
LDL Cholesterol: 75 mg/dL (ref 0–99)
NonHDL: 92.54
Total CHOL/HDL Ratio: 3
Triglycerides: 86 mg/dL (ref 0.0–149.0)
VLDL: 17.2 mg/dL (ref 0.0–40.0)

## 2022-10-03 LAB — HEMOGLOBIN A1C: Hgb A1c MFr Bld: 5.7 % (ref 4.6–6.5)

## 2022-10-03 LAB — B12 AND FOLATE PANEL
Folate: 9 ng/mL (ref >5.9)
Vitamin B-12: 223 pg/mL (ref 211–911)

## 2022-10-03 LAB — SEDIMENTATION RATE: Sed Rate: 5 mm/hr (ref 0–20)

## 2022-10-03 NOTE — Progress Notes (Signed)
Subjective:    Patient ID: Devin [name], male    DOB: 1940-11-05, 82 y.o.   MRN: [id number]  DOS:  10/03/2022 Type of visit - description: Routine checkup Last seen a year ago. His main concern is Parkinson. 9 months ago, he started noticing neurological problems, they started somewhat sudden: Slow speech, trouble "getting words out", stumbling, frequent falls, some difficulty swallowing without dysphagia per se. He has chronic tremors that are at baseline. Saw his doctor in [place] and he was told he has Parkinson.  He does not recall any episodes of fever, chills, rash or headache. Despite multiple falls, he does not recall any major head injury or LOC. Does not recall any episode that could reflect a stroke (no diplopia, motor deficits, face numbness).   Review of Systems See above   Past Medical History:  Diagnosis Date   Arthritis    Cataract    COPD (chronic obstructive pulmonary disease) ([place])    Diverticulosis    pt states he had diverticuli in his bladder also.   H/O [facility] spotted fever 01/27/2014   Hematuria    over 5 years ago   Hernia of abdominal cavity    History of rectal polyps    Hyperplastic colon polyp    Hypertension    Macular degeneration    Rectal cancer ([place]) 1994   Shoulder pain, left    per pt, he can lay on the left side!   Tremor 10/21/2018   Tubular adenoma    01/05/2013    Past Surgical History:  Procedure Laterality Date   APPENDECTOMY     COLON SURGERY     rectal cancer ~ 1999   EYE SURGERY     cataract sx   HERNIA REPAIR     IR KYPHO LUMBAR INC FX REDUCE BONE BX UNI/BIL CANNULATION INC/IMAGING  06/12/2017   IR RADIOLOGIST EVAL & MGMT  08/06/2017   mastoid gland removal     left ear   PROSTATE SURGERY  05/2015   TONSILLECTOMY      Current Outpatient Medications  Medication Instructions   acetic acid-hydrocortisone (VOSOL-HC) OTIC solution 3 drops, Both EARS, 3 times daily, Use for 7- 10 days   alendronate (FOSAMAX) 70  mg, Oral, Weekly, Take with a full glass of water.   bisoprolol (ZEBETA) 10 mg, Oral, Daily   carvedilol (COREG) 6.25 mg, Oral, 2 times daily with meals   clotrimazole-betamethasone (LOTRISONE) cream Topical, 2 times daily   fluorouracil (EFUDEX) 5 % cream No dose, route, or frequency recorded.   ketoconazole (NIZORAL) 2 % cream 1 application , Topical, Daily   nortriptyline (PAMELOR) 50 mg, Oral, Daily at bedtime       Objective:   Physical Exam BP 126/74   Pulse 64   Temp 97.7 F (36.5 C) (Oral)   Resp 18   Ht '6\' 4"'$  (1.93 m)   Wt 205 lb 6 oz (93.2 kg)   SpO2 96%   BMI 25.00 kg/m  General:   Well developed, NAD, BMI noted. HEENT:  Normocephalic . Face symmetric, atraumatic Lungs:  CTA B Normal respiratory effort, no intercostal retractions, no accessory muscle use. Heart: RRR,  no murmur.  Lower extremities: no pretibial edema bilaterally  Skin: Not pale. Not jaundice Neurologic:  alert & oriented X3.  Speech is slow, motor symmetric, diminished facial expression compared to previous years, gait: Unsteady, uses a cane, no shuffling noted. Unable to assess arm swing, no use in the left shoulder due  to MSK issues No tremors at the right hand Psych--  Cognition and judgment appear intact.  Cooperative with normal attention span and concentration.  Behavior appropriate. No anxious or depressed appearing.      Assessment   Assessment HTN Depression-insomnia, lost a son ~2011 GERD sx onset ~2016 MSK: --DJD- hip injection 2017  --L2 vertebral fracture d/t a fall , kyphoplasty 05-2017 --T score 06-2017 -0.2, was rx ca and vit D.  Started  Fosamax 09-2017 BPH --s/p surgery x2 2016 , Dr Hazle Nordmann H/o etoh, mild  Rectal cancer-- s/p surgery ~ 1999, no chemo-XRT FH CAD, F MI age 70 Former light smoker , No symptoms  PLAN  CBC FLP Z0C TSH H85, folic acid Tremors, question of Parkinson. See HPI, he has developed difficulty with his speech and swallowing about 9 months  ago.  On exam his facial expression is diminished. 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 Plan: - In the context of frequent falls will get CT head. - Also general labs and refer to neurology. HTN: On carvedilol, BP looks very good, check CMP CBC FLP Mild hyperglycemia: Check A1c Depression insomnia: On nortriptyline as needed Osteoporosis: On Fosamax, RF B12 deficiency: Checking labs: Not taking supplements Left shoulder: Planning to have a total shoulder replacement at some point in the next few months Preventive care: PNM 20 today.  Had a flu and a COVID-vaccine RTC 3 months  1-23 Here for CPX HTN: Well-controlled, continue carvedilol. Depression, insomnia: Uses nortriptyline as needed Osteoporosis: Last T score was -0.3 (April 2021), history of L2 vertebral fracture and kyphoplasty, started Fosamax 2019, holiday by 2024 DJD: Refer to Ortho due to left shoulder pain.  He uses NSAIDs carefully, recommend to also use Tylenol. FH CAD: Last LDL 73, not on statins, has a healthy lifestyle. Tremors, paresthesias from the waist down: Patient report tremors and below-waist paresthesias, exam is benign.  Unclear the meaning of the paresthesia from the waist down, does have chronic pain from L2 vertebral fracture (2018).  We discussed the potential referral but he is not interested at this point. RTC 3 months to reassess DJD, tremors etc.  (Patient reports he likely will be back to [place] by then).

## 2022-10-03 NOTE — Patient Instructions (Addendum)
Vaccines I recommend:  RSV vaccine  Check the  blood pressure regularly BP GOAL is between 110/65 and  135/85. If it is consistently higher or lower, let me know  Will arrange a CAT scan of your head  We will refer you to neurology  GO TO THE LAB : Get the blood work     [place], [place] back for a physical exam in 3 months

## 2022-10-04 NOTE — Assessment & Plan Note (Signed)
Tremors, difficulty w/ swallowing See HPI, he has developed difficulty with his speech and swallowing about 9 months ago.  On exam his facial expression is diminished. 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 Plan: - In the context of frequent falls will get CT head. - Also general labs and refer to neurology. HTN: On carvedilol, BP looks very good, check CMP CBC FLP Mild hyperglycemia: Check A1c Depression insomnia: On nortriptyline as needed Osteoporosis: On Fosamax, RF B12 deficiency: Checking labs: Not taking supplements Left shoulder: Planning to have a total shoulder replacement at some point in the next few months Preventive care: PNM 20 today.  Had a flu and a COVID-vaccine RTC 3 months

## 2022-10-13 ENCOUNTER — Other Ambulatory Visit: Payer: Self-pay | Admitting: Internal Medicine

## 2022-10-14 ENCOUNTER — Encounter: Payer: Self-pay | Admitting: Neurology

## 2022-10-15 DIAGNOSIS — H353121 Nonexudative age-related macular degeneration, left eye, early dry stage: Secondary | ICD-10-CM | POA: Diagnosis not present

## 2022-10-15 DIAGNOSIS — H353211 Exudative age-related macular degeneration, right eye, with active choroidal neovascularization: Secondary | ICD-10-CM | POA: Diagnosis not present

## 2022-10-15 DIAGNOSIS — Z961 Presence of intraocular lens: Secondary | ICD-10-CM | POA: Diagnosis not present

## 2022-10-15 DIAGNOSIS — H35453 Secondary pigmentary degeneration, bilateral: Secondary | ICD-10-CM | POA: Diagnosis not present

## 2022-10-15 DIAGNOSIS — H35373 Puckering of macula, bilateral: Secondary | ICD-10-CM | POA: Diagnosis not present

## 2022-10-15 DIAGNOSIS — H35363 Drusen (degenerative) of macula, bilateral: Secondary | ICD-10-CM | POA: Diagnosis not present

## 2022-10-23 ENCOUNTER — Telehealth (HOSPITAL_BASED_OUTPATIENT_CLINIC_OR_DEPARTMENT_OTHER): Payer: Self-pay

## 2022-10-23 DIAGNOSIS — L578 Other skin changes due to chronic exposure to nonionizing radiation: Secondary | ICD-10-CM | POA: Diagnosis not present

## 2022-10-23 DIAGNOSIS — L820 Inflamed seborrheic keratosis: Secondary | ICD-10-CM | POA: Diagnosis not present

## 2022-10-23 DIAGNOSIS — D0439 Carcinoma in situ of skin of other parts of face: Secondary | ICD-10-CM | POA: Diagnosis not present

## 2022-10-23 DIAGNOSIS — D485 Neoplasm of uncertain behavior of skin: Secondary | ICD-10-CM | POA: Diagnosis not present

## 2022-10-23 DIAGNOSIS — W908XXS Exposure to other nonionizing radiation, sequela: Secondary | ICD-10-CM | POA: Diagnosis not present
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
## 2022-11-01 ENCOUNTER — Ambulatory Visit (HOSPITAL_BASED_OUTPATIENT_CLINIC_OR_DEPARTMENT_OTHER)
Admission: RE | Admit: 2022-11-01 | Discharge: 2022-11-01 | Disposition: A | Discharge status: Home / Self Care | Payer: Medicare HMO | Source: Ambulatory Visit | Attending: Internal Medicine | Admitting: Internal Medicine

## 2022-11-01 DIAGNOSIS — I672 Cerebral atherosclerosis: Secondary | ICD-10-CM | POA: Diagnosis not present

## 2022-11-01 DIAGNOSIS — R1310 Dysphagia, unspecified: Secondary | ICD-10-CM | POA: Diagnosis not present

## 2022-11-01 DIAGNOSIS — R251 Tremor, unspecified: Secondary | ICD-10-CM | POA: Diagnosis not present

## 2022-11-01 DIAGNOSIS — R296 Repeated falls: Secondary | ICD-10-CM | POA: Insufficient documentation

## 2022-11-01 DIAGNOSIS — R29818 Other symptoms and signs involving the nervous system: Secondary | ICD-10-CM | POA: Diagnosis not present

## 2022-11-01 DIAGNOSIS — Z9889 Other specified postprocedural states: Secondary | ICD-10-CM | POA: Diagnosis not present

## 2022-11-03 ENCOUNTER — Ambulatory Visit (INDEPENDENT_AMBULATORY_CARE_PROVIDER_SITE_OTHER): Payer: Medicare HMO | Admitting: Internal Medicine

## 2022-11-03 VITALS — BP 122/68 | HR 70 | Temp 97.4°F | Resp 16 | Ht 76.0 in | Wt 200.5 lb

## 2022-11-03 DIAGNOSIS — K5989 Other specified functional intestinal disorders: Secondary | ICD-10-CM | POA: Diagnosis not present

## 2022-11-03 DIAGNOSIS — E538 Deficiency of other specified B group vitamins: Secondary | ICD-10-CM | POA: Diagnosis not present

## 2022-11-03 DIAGNOSIS — R1310 Dysphagia, unspecified: Secondary | ICD-10-CM | POA: Diagnosis not present

## 2022-11-03 DIAGNOSIS — M810 Age-related osteoporosis without current pathological fracture: Secondary | ICD-10-CM | POA: Diagnosis not present

## 2022-11-03 MED ORDER — CLOTRIMAZOLE-BETAMETHASONE 1-0.05 % EX CREA: TOPICAL_CREAM | Freq: Two times a day (BID) | CUTANEOUS | 1 refills | Status: DC

## 2022-11-03 MED ORDER — OMEPRAZOLE 20 MG PO CPDR: 20.0000 mg | DELAYED_RELEASE_CAPSULE | Freq: Every day | ORAL | 1 refills | Status: DC

## 2022-11-03 NOTE — Patient Instructions (Signed)
We are referring you to the gastroenterology group.  Be sure you take B12 supplements daily  GO TO THE FRONT DESK, PLEASE SCHEDULE YOUR APPOINTMENTS Come back for   a checkup in 3 months    STOP BY THE FIRST FLOOR: Schedule a bone density test

## 2022-11-03 NOTE — Assessment & Plan Note (Signed)
Rectal atony: Symptoms as described per patient sound like rectal atony in the context of rectal cancer in 1999, had surgery, no chemo or XRT. Saw GI 04/12/2020, they noted he had a  Cscope 2018: had a  solitary ulcer and no polyps.   Recall colonoscopy August 2023. Plan: Refer to GI Tremors, difficulty swallowing: See LOV, CT head no acute, B12 normal, to see neurology this week. B12 deficiency: Last levels okay, nevertheless encourage OTC B12. Osteoporosis: Check DEXA.  Started Fosamax 2019. L shoulder pain: To have surgery soon RTC 3 months

## 2022-11-03 NOTE — Progress Notes (Signed)
Subjective:    Patient ID: Devin [name], male    DOB: March 28, 1941, 82 y.o.   MRN: [id number]  DOS:  11/03/2022 Type of visit - description: f/u  The patient gave me a summary of his health. - Reports his vision is very good. - He got a flu and COVID vaccines in September 2023. -Had a pneumonia shot October 2023. - His GP in [place] has referred him to a lung doctor. - Has shoulder replacement surgery scheduled for next month here in [facility].  -His main concern is his speech pattern: He slur his words, holding his food in the mouth for a long period of time, swallowing is "thoughtful decision".  - He also have GI symptoms: For long time, he noted he is unable to finish defecation.  About twice a week he has to manually remove his stools. Stools are of  normal color and no blood. He denies dysuria or gross hematuria.  Urinary flow is slightly slow.  Review of Systems See above   Past Medical History:  Diagnosis Date   Arthritis    Cataract    COPD (chronic obstructive pulmonary disease) ([place])    Diverticulosis    pt states he had diverticuli in his bladder also.   H/O [facility] spotted fever 01/27/2014   Hematuria    over 5 years ago   Hernia of abdominal cavity    History of rectal polyps    Hyperplastic colon polyp    Hypertension    Macular degeneration    Rectal cancer ([place]) 1994   Shoulder pain, left    per pt, he can lay on the left side!   Tremor 10/21/2018   Tubular adenoma    01/05/2013    Past Surgical History:  Procedure Laterality Date   APPENDECTOMY     COLON SURGERY     rectal cancer ~ 1999   EYE SURGERY     cataract sx   HERNIA REPAIR     IR KYPHO LUMBAR INC FX REDUCE BONE BX UNI/BIL CANNULATION INC/IMAGING  06/12/2017   IR RADIOLOGIST EVAL & MGMT  08/06/2017   mastoid gland removal     left ear   PROSTATE SURGERY  05/2015   TONSILLECTOMY      Current Outpatient Medications  Medication Instructions   alendronate (FOSAMAX) 70 mg, Oral,  Weekly, Take with a full glass of water.   carvedilol (COREG) 6.25 mg, Oral, 2 times daily with meals   clotrimazole-betamethasone (LOTRISONE) cream Topical, 2 times daily   fluorouracil (EFUDEX) 5 % cream No dose, route, or frequency recorded.   ketoconazole (NIZORAL) 2 % cream 1 application , Topical, Daily   nortriptyline (PAMELOR) 50 mg, Oral, Daily at bedtime   omeprazole (PRILOSEC) 20 mg, Oral, Daily before breakfast       Objective:   Physical Exam BP 122/68   Pulse 70   Temp (!) 97.4 F (36.3 C) (Oral)   Resp 16   Ht '6\' 4"'$  (1.93 m)   Wt 200 lb 8 oz (90.9 kg)   SpO2 98%   BMI 24.41 kg/m  General:   Well developed, NAD, BMI noted.  HEENT:  Normocephalic . Face symmetric, atraumatic Lungs:  CTA B Normal respiratory effort, no intercostal retractions, no accessory muscle use. Heart: RRR,  no murmur.  Abdomen:  Not distended, soft, non-tender. No rebound or rigidity. DRE: Anal sphincter is loose, prostate is normal, he has a small amount of normal to hard stools, brown in  color.  No mucus, no blood Skin: Not pale. Not jaundice Lower extremities: no pretibial edema bilaterally  Neurologic:  alert & oriented X3.  Speech normal, gait seems appropriate.  Facial expression decreased  Psych--  Cognition and judgment appear intact.  Cooperative with normal attention span and concentration.  Behavior appropriate. No anxious or depressed appearing.     Assessment     Assessment HTN Depression-insomnia, lost a son ~2011 GERD sx onset ~2016 MSK: --DJD- hip injection 2017  --L2 vertebral fracture d/t a fall , kyphoplasty 05-2017 --T score 06-2017 -0.2, was rx ca and vit D.  Started  Fosamax 09-2017 --Osteoporosis: See above BPH --s/p surgery x2 2016 , Dr Hazle Nordmann H/o etoh, mild  Rectal cancer-- s/p surgery ~ 1999, no chemo-XRT FH CAD, F MI age 19 Former light smoker , No symptoms Rash, groin lotrisone prn Skin Ca: Dr Tamala Julian, [place] Rectal  atony: Symptoms as described per patient sound like rectal atony in the context of rectal cancer in 1999, had surgery, no chemo or XRT. Saw GI 04/12/2020, they noted he had a  Cscope 2018: had a  solitary ulcer and no polyps.   Recall colonoscopy August 2023. Plan: Refer to GI Tremors, difficulty swallowing: See LOV, CT head no acute, B12 normal, to see neurology this week. B12 deficiency: Last levels okay, nevertheless encourage OTC B12. Osteoporosis: Check DEXA.  Started Fosamax 2019. L shoulder pain: To have surgery soon RTC 3 months

## 2022-11-04 ENCOUNTER — Ambulatory Visit (HOSPITAL_BASED_OUTPATIENT_CLINIC_OR_DEPARTMENT_OTHER)
Admission: RE | Admit: 2022-11-04 | Discharge: 2022-11-04 | Disposition: A | Discharge status: Home / Self Care | Payer: Medicare HMO | Source: Ambulatory Visit | Attending: Internal Medicine | Admitting: Internal Medicine

## 2022-11-04 ENCOUNTER — Ambulatory Visit: Payer: Medicare HMO | Admitting: Neurology

## 2022-11-04 ENCOUNTER — Encounter: Payer: Self-pay | Admitting: Neurology

## 2022-11-04 VITALS — BP 131/79 | HR 72 | Ht 74.5 in | Wt 202.6 lb

## 2022-11-04 DIAGNOSIS — R251 Tremor, unspecified: Secondary | ICD-10-CM | POA: Diagnosis not present

## 2022-11-04 DIAGNOSIS — R1319 Other dysphagia: Secondary | ICD-10-CM

## 2022-11-04 DIAGNOSIS — Z13820 Encounter for screening for osteoporosis: Secondary | ICD-10-CM | POA: Diagnosis not present

## 2022-11-04 DIAGNOSIS — M810 Age-related osteoporosis without current pathological fracture: Secondary | ICD-10-CM | POA: Diagnosis not present

## 2022-11-04 NOTE — Patient Instructions (Signed)

## 2022-11-05 ENCOUNTER — Ambulatory Visit: Payer: Medicare HMO

## 2022-11-05 ENCOUNTER — Other Ambulatory Visit (HOSPITAL_COMMUNITY): Payer: Self-pay

## 2022-11-05 DIAGNOSIS — R1310 Dysphagia, unspecified: Secondary | ICD-10-CM

## 2022-11-06 NOTE — Addendum Note (Signed)
Addended byDamita Dunnings D on: 11/06/2022 04:20 PM   Modules accepted: Orders

## 2022-11-11 DIAGNOSIS — D0439 Carcinoma in situ of skin of other parts of face: Secondary | ICD-10-CM | POA: Diagnosis not present

## 2022-11-11 DIAGNOSIS — L718 Other rosacea: Secondary | ICD-10-CM | POA: Diagnosis not present

## 2022-11-11 DIAGNOSIS — D485 Neoplasm of uncertain behavior of skin: Secondary | ICD-10-CM | POA: Diagnosis not present

## 2022-11-12 ENCOUNTER — Ambulatory Visit (HOSPITAL_COMMUNITY)
Admission: RE | Admit: 2022-11-12 | Discharge: 2022-11-12 | Disposition: A | Discharge status: Home / Self Care | Payer: Medicare HMO | Source: Ambulatory Visit | Attending: Internal Medicine | Admitting: Internal Medicine

## 2022-11-12 DIAGNOSIS — Z85048 Personal history of other malignant neoplasm of rectum, rectosigmoid junction, and anus: Secondary | ICD-10-CM | POA: Diagnosis not present

## 2022-11-12 DIAGNOSIS — I10 Essential (primary) hypertension: Secondary | ICD-10-CM | POA: Diagnosis not present

## 2022-11-12 DIAGNOSIS — Z8719 Personal history of other diseases of the digestive system: Secondary | ICD-10-CM | POA: Diagnosis not present

## 2022-11-12 DIAGNOSIS — G20A1 Parkinson's disease without dyskinesia, without mention of fluctuations: Secondary | ICD-10-CM | POA: Diagnosis not present

## 2022-11-12 DIAGNOSIS — R1319 Other dysphagia: Secondary | ICD-10-CM | POA: Diagnosis not present

## 2022-11-12 DIAGNOSIS — R1310 Dysphagia, unspecified: Secondary | ICD-10-CM

## 2022-11-12 DIAGNOSIS — J449 Chronic obstructive pulmonary disease, unspecified: Secondary | ICD-10-CM | POA: Diagnosis not present

## 2022-11-12 DIAGNOSIS — R059 Cough, unspecified: Secondary | ICD-10-CM | POA: Diagnosis not present

## 2022-11-12 DIAGNOSIS — R531 Weakness: Secondary | ICD-10-CM | POA: Insufficient documentation

## 2022-11-12 NOTE — Evaluation (Signed)
Modified Barium Swallow Study  Patient Details  Name: Devin [name] MRN: [id number] Date of Birth: 24-Oct-1940  Today's Date: 11/12/2022  Modified Barium Swallow completed.  Full report located under Chart Review in the Imaging Section.  History of Present Illness Devin [name] arrives for an OP MBS, referred by neurologist given report of difficulty swallowing in setting of ongoing work up for potential early Parkinsonism. Pt additionally reports tremor, weak voice, frequent falls, slow movements. He describes his difficulty swallowing as "I forget to swallow"   and says she has to stay very attentive. He denies any recent pna.   Clinical Impression Pt demonstrates mild changes to swallowing physiology; primary problem being late initaition with liquids at times reaching the pyriform sinuses prior to hyoid burst. Only when trying to take a pill with thin liquids did this result in mild sensed aspiration (PAS 7). Pt was additionally observed to have slightly slow lingual propulsion with trace oral residue with solids that pt transports to base of tongue post swallow without initiating swallow. These impairments are mild and fairly consistent with pts reports of "forgetting to swallow" and "needing to pay close attention." No diet modification is needed at this time other than suggestion to take pills whole in puree. Pt could benefit from f/u with SLP primarily to address voice and speech. Factors that may increase risk of adverse event in presence of aspiration Devin [name] & Devin [name] 2021):    Swallow Evaluation Recommendations Recommendations: PO diet PO Diet Recommendation: Regular;Thin liquids (Level 0) Liquid Administration via: Cup;Straw Medication Administration: Whole meds with puree Supervision: Patient able to self-feed Postural changes: Position pt fully upright for meals      Devin [name], Devin [name] 11/12/2022,12:05 PM

## 2022-11-13 ENCOUNTER — Encounter: Payer: Self-pay | Admitting: Internal Medicine

## 2022-11-16 ENCOUNTER — Encounter: Payer: Self-pay | Admitting: Internal Medicine

## 2022-11-19 DIAGNOSIS — M19112 Post-traumatic osteoarthritis, left shoulder: Secondary | ICD-10-CM | POA: Diagnosis not present

## 2022-11-19 DIAGNOSIS — J449 Chronic obstructive pulmonary disease, unspecified: Secondary | ICD-10-CM | POA: Diagnosis not present

## 2022-11-24 ENCOUNTER — Telehealth: Payer: Self-pay | Admitting: Internal Medicine

## 2022-11-24 NOTE — Telephone Encounter (Signed)
FYI. Pt is supposed to be headed to UC.

## 2022-11-24 NOTE — Telephone Encounter (Signed)
Pt called stating that he was having stabbing abdominal pain and wanted to be seen. Pt was transferred to triage nurse for further eval.

## 2022-11-24 NOTE — Telephone Encounter (Signed)
Initial Comment Caller states he has a sharp pain in lower abdomen, stops breathing sometimes from pain. He thinks may be colon cancer like his mother. Wants an appointment. Translation No Nurse Assessment Nurse: Ahorlu, RN, Shellia Cleverly Date/Time (Eastern Time): 11/24/2022 3:54:43 PM Confirm and document reason for call. If symptomatic, describe symptoms. ---Caller states he has a sharp pain in bilateral lower quadrant abdomen, stops breathing sometimes from pain. family history of colon cancer (mother) denies nausea vomiting diarrhea or fever Does the patient have any new or worsening symptoms? ---Yes Will a triage be completed? ---Yes Related visit to physician within the last 2 weeks? ---No Does the PT have any chronic conditions? (i.e. diabetes, asthma, this includes High risk factors for pregnancy, etc.) ---Yes List chronic conditions. ---rectal cancer shoulder injury Is this a behavioral health or substance abuse call? ---No Guidelines Guideline Title Affirmed Question Affirmed Notes Nurse Date/Time (Eastern Time) Abdominal Pain - Male [1] MILDMODERATE pain AND [2] constant AND [3] present > 2 hours Ahorlu, RN, Shellia Cleverly 11/24/2022 3:56:48 PM PLEASE NOTE: All timestamps contained within this report are represented as [place] Standard Time. CONFIDENTIALTY NOTICE: This fax transmission is intended only for the addressee. It contains information that is legally privileged, confidential or otherwise protected from use or disclosure. If you are not the intended recipient, you are strictly prohibited from reviewing, disclosing, copying using or disseminating any of this information or taking any action in reliance on or regarding this information. If you have received this fax in error, please notify us immediately by telephone so that we can arrange for its return to [place]. Phone: [phone], Toll-Free: [phone], Fax: [phone] Page: 2 of 2 Call Id: [id number] [place]. Time  Eilene Ghazi Time) Disposition Final User 11/24/2022 3:52:58 PM Send to Urgent Queue Lawernce Keas [id number] 4:02:40 PM See HCP within 4 Hours (or PCP triage) Yes Ahorlu, RN, Shellia Cleverly Final Disposition 11/24/2022 4:02:40 PM See HCP within 4 Hours (or PCP triage) Yes Ahorlu, RN, Mercy Moore Disagree/Comply Disagree Caller Understands Yes PreDisposition Call Doctor Care Advice Given Per Guideline SEE HCP (OR PCP TRIAGE) WITHIN 4 HOURS: * IF OFFICE WILL BE OPEN: You need to be seen within the next 3 or 4 hours. Call your doctor (or NP/PA) now or as soon as the office opens. * UCC: Some UCCs can manage patients who are stable and have less serious symptoms (e.g., minor illnesses and injuries). The triager must know the [facility] capabilities before sending a patient there. If unsure, call ahead. NOTHING BY MOUTH: * Do not eat or drink anything for now. CARE ADVICE given per Abdominal Pain - Male (Adult) guideline. 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

## 2022-11-25 ENCOUNTER — Telehealth: Payer: Self-pay

## 2022-11-25 ENCOUNTER — Encounter: Payer: Self-pay | Admitting: Internal Medicine

## 2022-11-25 ENCOUNTER — Ambulatory Visit (INDEPENDENT_AMBULATORY_CARE_PROVIDER_SITE_OTHER): Payer: Medicare HMO | Admitting: Internal Medicine

## 2022-11-25 VITALS — BP 126/76 | HR 76 | Temp 97.9°F | Resp 16 | Ht 74.5 in | Wt 204.5 lb

## 2022-11-25 DIAGNOSIS — Z01818 Encounter for other preprocedural examination: Secondary | ICD-10-CM | POA: Diagnosis not present

## 2022-11-25 DIAGNOSIS — R1030 Lower abdominal pain, unspecified: Secondary | ICD-10-CM

## 2022-11-25 LAB — POC URINALSYSI DIPSTICK (AUTOMATED)
Bilirubin, UA: NEGATIVE
Blood, UA: NEGATIVE
Glucose, UA: NEGATIVE
Ketones, UA: NEGATIVE
Leukocytes, UA: NEGATIVE
Nitrite, UA: NEGATIVE
Protein, UA: NEGATIVE
Spec Grav, UA: 1.015 (ref 1.010–1.025)
Urobilinogen, UA: 0.2 E.U./dL
pH, UA: 5 (ref 5.0–8.0)

## 2022-11-25 LAB — EKG 12-LEAD

## 2022-11-25 NOTE — Progress Notes (Unsigned)
Subjective:    Patient ID: Devin [name], male    DOB: 10/07/1940, 82 y.o.   MRN: [id number]  DOS:  11/25/2022 Type of visit - description: Acute Chief complaint for the visit these abdominal pain.  We also addressed other issues and review the chart:  Yesterday he developed sharp bilateral lower abdominal pain, to the point that he could not walk. Today the pain is less intense. 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 Denies dysuria or gross hematuria.  Did experience nocturia x 2 which is somewhat unusual for him.  Labs 11/19/2022 reviewed, done by Ortho for preop: Hemoglobin 14.5, platelets normal, glucose 96, creatinine 0.9, LFTs normal. Total cholesterol 156, LDL 61. A1c 5.5.  Sed rate, CRP normal    Need of shoulder surgery, needs clearance.  Denies chest pain difficulty breathing or palpitations. Review of Systems See above   Past Medical History:  Diagnosis Date   Arthritis    Cataract    COPD (chronic obstructive pulmonary disease) ([place])    Diverticulosis    pt states he had diverticuli in his bladder also.   H/O [facility] spotted fever 01/27/2014   Hematuria    over 5 years ago   Hernia of abdominal cavity    History of rectal polyps    Hyperplastic colon polyp    Hypertension    Macular degeneration    Rectal cancer ([place]) 1994   Shoulder pain, left    per pt, he can lay on the left side!   Tremor 10/21/2018   Tubular adenoma    01/05/2013    Past Surgical History:  Procedure Laterality Date   APPENDECTOMY     COLON SURGERY     rectal cancer ~ 1999   EYE SURGERY     cataract sx   HERNIA REPAIR     IR KYPHO LUMBAR INC FX REDUCE BONE BX UNI/BIL CANNULATION INC/IMAGING  06/12/2017   IR RADIOLOGIST EVAL & MGMT  08/06/2017   mastoid gland removal     left ear   PROSTATE SURGERY  05/2015   TONSILLECTOMY      Current  Outpatient Medications  Medication Instructions   carvedilol (COREG) 6.25 mg, Oral, 2 times daily with meals   clotrimazole-betamethasone (LOTRISONE) cream Topical, 2 times daily   fluorouracil (EFUDEX) 5 % cream No dose, route, or frequency recorded.   nortriptyline (PAMELOR) 50 mg, Oral, Daily at bedtime   omeprazole (PRILOSEC) 20 mg, Oral, Daily before breakfast       Objective:   Physical Exam BP 126/76   Pulse 76   Temp 97.9 F (36.6 C) (Oral)   Resp 16   Ht 6' 2.5" (1.892 m)   Wt 204 lb 8 oz (92.8 kg)   SpO2 97%   BMI 25.90 kg/m  General:   Well developed, nontoxic, no acute distress. HEENT:  Normocephalic . Face symmetric, atraumatic Lungs:  CTA B Normal respiratory effort, no intercostal retractions, no accessory muscle use. Heart: RRR,  no murmur.  Abdomen:  Not distended, tender at the lower abdomen particularly at the left lower quadrant and suprapubic area.  No mass or rebound. Skin: Not pale. Not jaundice Lower extremities: no pretibial edema bilaterally  Neurologic:  alert & oriented X3.  Speech normal, gait appropriate for age.   psych--  Cognition and judgment appear intact.  Cooperative with  normal attention span and concentration.  Behavior appropriate. No anxious or depressed appearing.     Assessment     Assessment HTN Depression-insomnia, lost a son ~2011 GERD sx onset ~2016 MSK: --DJD- hip injection 2017  --L2 vertebral fracture d/t a fall , kyphoplasty 05-2017 --T score 06-2017 -0.2, was rx ca and vit D.  Started  Fosamax 09-2017 --Osteoporosis: See above BPH --s/p surgery x2 2016 , Dr Hazle Nordmann H/o etoh, mild  Rectal cancer-- s/p surgery ~ 1999, no chemo-XRT FH CAD, F MI age 38 Former light smoker , No symptoms Rash, groin lotrisone prn Skin Ca: Dr Tamala Julian, [place] Acute lower abdominal pain: Started yesterday, no fever or chills, last BM about 5 days ago but that is not unusual for him, he has a history of  rectal atony (see LOV). UDip negative. DDx includes diverticulitis, distal obstruction, rectal impaction, others. Recent BMP satisfactory, will proceed with a CT abdomen and pelvis.  ER if symptoms severe. Addendum: Declined need to do it today or going to the ER because he cannot drive at night.  States he will do it first thing in the morning.  Encouraged to go to the ER if symptoms increase Rectal atony: GI eval pending Difficulty swallowing, tremors, Parkinson's?    Due to tremors and difficulty swallowing was seen by neurology, they ordered a DAT scan which is pending. Barium swallow study, 11/12/2022 excerpts: "Pt demonstrates mild changes to swallowing physiology; primary problem being late initaition with liquids at times reaching the pyriform sinuses prior to hyoid burst. 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 He is clear from my side (depending on CT scan results).  He already has secure a postoperative visit today at [facility]. Will make surgeon aware of possible Parkinson     Rectal atony: Symptoms as described per patient sound like rectal atony in the context of rectal cancer in 1999, had surgery, no chemo or XRT. Saw GI 04/12/2020, they noted he had a  Cscope 2018: had a  solitary ulcer and no polyps.   Recall colonoscopy August 2023. Plan: Refer to GI Tremors, difficulty swallowing: See LOV, CT head no acute, B12 normal, to see neurology this week. B12 deficiency: Last levels okay, nevertheless encourage OTC B12. Osteoporosis: Check DEXA.  Started Fosamax 2019. L shoulder pain: To have surgery soon RTC 3 months

## 2022-11-25 NOTE — Telephone Encounter (Signed)
Has an appointment here today.

## 2022-11-25 NOTE — Telephone Encounter (Addendum)
Received surgical clearance form from [facility]. Pt is scheduled for L total shoulder arthroplasty with Dr. Martinique Case on 12/16/2022. Pt needs appt for surgical clearance. Last EKG 2019.

## 2022-11-25 NOTE — Telephone Encounter (Signed)
Pt seen today 11/25/22, awaiting CT scan results.

## 2022-11-25 NOTE — Patient Instructions (Signed)
We need to arrange for a CAT scan of your abdomen and pelvis.  If you have fever, chills or increased pain: Go to the emergency room.    Neurology recommended brain scan.  You can reach the neurology office at [phone]

## 2022-11-26 ENCOUNTER — Ambulatory Visit: Payer: Medicare HMO | Admitting: Internal Medicine

## 2022-11-26 NOTE — Assessment & Plan Note (Signed)
Acute lower abdominal pain: Started yesterday, no fever or chills, last BM about 5 days ago but that is not unusual for him, he has a history of rectal atony (see LOV). UDip negative. DDx includes diverticulitis, distal obstruction, rectal impaction, others. Recent BMP satisfactory, will proceed with a CT abdomen and pelvis.  ER if symptoms severe. Addendum: Declined need to do CT today or going to the ER now despite my multiple requests to do so. States  he cannot drive at night.  States he will do it first thing in the morning.  Encouraged to go to the ER if symptoms increase Rectal atony: GI eval pending Difficulty swallowing, tremors, Parkinson's?    Due to tremors and difficulty swallowing was seen by neurology, they ordered a DAT scan which is pending. Barium swallow study, 11/12/2022 excerpts: "Pt demonstrates mild changes to swallowing physiology; primary problem being late initaition with liquids at times reaching the pyriform sinuses prior to hyoid burst. 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 He is clear from my side (depending on CT scan results).  He already has secure a postoperative stay at [facility]. Will make surgeon aware of possible Parkinson

## 2022-11-27 ENCOUNTER — Ambulatory Visit (HOSPITAL_BASED_OUTPATIENT_CLINIC_OR_DEPARTMENT_OTHER)
Admission: RE | Admit: 2022-11-27 | Discharge: 2022-11-27 | Disposition: A | Discharge status: Home / Self Care | Payer: Medicare HMO | Source: Ambulatory Visit | Attending: Internal Medicine | Admitting: Internal Medicine

## 2022-11-27 ENCOUNTER — Ambulatory Visit (HOSPITAL_BASED_OUTPATIENT_CLINIC_OR_DEPARTMENT_OTHER): Admission: RE | Admit: 2022-11-27 | Payer: Medicare HMO | Source: Ambulatory Visit

## 2022-11-27 DIAGNOSIS — K5730 Diverticulosis of large intestine without perforation or abscess without bleeding: Secondary | ICD-10-CM | POA: Diagnosis not present

## 2022-11-27 DIAGNOSIS — R1030 Lower abdominal pain, unspecified: Secondary | ICD-10-CM | POA: Diagnosis not present

## 2022-11-27 MED ORDER — IOHEXOL 300 MG/ML  SOLN
100.0000 mL | Freq: Once | INTRAMUSCULAR | Status: AC | PRN
  Administered 2022-11-27: 100 mL via INTRAVENOUS

## 2022-11-28 ENCOUNTER — Telehealth: Payer: Self-pay | Admitting: Internal Medicine

## 2022-11-28 ENCOUNTER — Other Ambulatory Visit: Payer: Self-pay | Admitting: *Deleted

## 2022-11-28 ENCOUNTER — Telehealth: Payer: Self-pay | Admitting: Neurology

## 2022-11-28 MED ORDER — AMOXICILLIN-POT CLAVULANATE ER 1000-62.5 MG PO TB12: 1.0000 | ORAL_TABLET | Freq: Two times a day (BID) | ORAL | 0 refills | Status: DC

## 2022-11-28 MED ORDER — AMOXICILLIN-POT CLAVULANATE 875-125 MG PO TABS: 1.0000 | ORAL_TABLET | Freq: Two times a day (BID) | ORAL | 0 refills | Status: DC

## 2022-11-28 NOTE — Telephone Encounter (Signed)
Called patient and his voicemail has not been set up  Devin [name] called me and left a message that they need clearance due to patients having parkinson's  I called her back and let her know patient has not been diagnosed with parkinson as we are still doing testing and patient is not currently on medication for parkinson's

## 2022-11-28 NOTE — Telephone Encounter (Signed)
Called [facility] to let her know that Dr. Carles Collet does not prescribe medication for this patient and the Dr. Is still doing testing regarding the patients condition

## 2022-11-28 NOTE — Telephone Encounter (Signed)
Pt is having shoulder replacement surgery on 12/16/22. The pt stated [facility] Surgery Coordinator needs Dr. Doristine Devoid clearance. Their  Phone number is [phone] and fax [phone].  Pt also would like to speak with someone on if he has parkinson's or not.

## 2022-11-28 NOTE — Telephone Encounter (Signed)
Pt said he is having his left shoulder replaced and he needs Dr. Larose Kells to call Devin [name] at Dr. Aliene Beams office to clear him to be okay for surgery due to the diverticulitis. Julie's number is [phone]. Please advise pt .

## 2022-11-28 NOTE — Telephone Encounter (Signed)
Patient is scheduled for Dat Scan on December 02 2022

## 2022-11-28 NOTE — Telephone Encounter (Signed)
I sent Augmentin 875 mg 1 p.o. twice daily.  Hopefully that will be covered. Please check on that in few minutes

## 2022-11-28 NOTE — Telephone Encounter (Signed)
Surgical clearance faxed

## 2022-11-28 NOTE — Telephone Encounter (Signed)
Lilia Pro with [facility] advised that insurance will not cover the antibiotic called in ($150) and she wanted to see if provider would be willing to call in an alternative medication.

## 2022-12-01 NOTE — Telephone Encounter (Signed)
Clearance faxed on 11/28/22.

## 2022-12-02 ENCOUNTER — Encounter (HOSPITAL_COMMUNITY)
Admission: RE | Admit: 2022-12-02 | Discharge: 2022-12-02 | Disposition: A | Discharge status: Home / Self Care | Payer: Medicare HMO | Source: Ambulatory Visit | Attending: Neurology | Admitting: Neurology

## 2022-12-02 DIAGNOSIS — R251 Tremor, unspecified: Secondary | ICD-10-CM

## 2022-12-02 MED ORDER — IOFLUPANE I 123 185 MBQ/2.5ML IV SOLN
4.7000 | Freq: Once | INTRAVENOUS | Status: AC | PRN
  Administered 2022-12-02: 4.7 via INTRAVENOUS
  Filled 2022-12-02: qty 5

## 2022-12-02 MED ORDER — POTASSIUM IODIDE (ANTIDOTE) 130 MG PO TABS
ORAL_TABLET | ORAL | Status: AC
  Administered 2022-12-02: 130 mg via ORAL
  Filled 2022-12-02: qty 1
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
## 2022-12-02 NOTE — Telephone Encounter (Signed)
Received call from [facility]- they wanted to clarify notes on surgical clearance. Informed that per PCP- he is cleared for surgery but wanted to make sure that surgeon is aware that Pt is having work-up with neurology for parkinsons dx. Also made them aware that he currently has diverticulitis but is on abx.

## 2022-12-03 DIAGNOSIS — K5792 Diverticulitis of intestine, part unspecified, without perforation or abscess without bleeding: Secondary | ICD-10-CM | POA: Insufficient documentation

## 2022-12-03 NOTE — Assessment & Plan Note (Signed)
Well controlled, no changes to meds. Encouraged heart healthy diet such as the DASH diet and exercise as tolerated.  °

## 2022-12-04 ENCOUNTER — Telehealth: Payer: Self-pay | Admitting: Internal Medicine

## 2022-12-04 ENCOUNTER — Ambulatory Visit (INDEPENDENT_AMBULATORY_CARE_PROVIDER_SITE_OTHER): Payer: Medicare HMO | Admitting: Family Medicine

## 2022-12-04 ENCOUNTER — Ambulatory Visit: Payer: Medicare HMO

## 2022-12-04 VITALS — BP 122/72 | HR 73 | Temp 98.0°F | Resp 16 | Ht 72.0 in | Wt 204.2 lb

## 2022-12-04 DIAGNOSIS — R109 Unspecified abdominal pain: Secondary | ICD-10-CM | POA: Diagnosis not present

## 2022-12-04 DIAGNOSIS — F419 Anxiety disorder, unspecified: Secondary | ICD-10-CM

## 2022-12-04 DIAGNOSIS — F32A Depression, unspecified: Secondary | ICD-10-CM

## 2022-12-04 DIAGNOSIS — I10 Essential (primary) hypertension: Secondary | ICD-10-CM | POA: Diagnosis not present

## 2022-12-04 DIAGNOSIS — K5792 Diverticulitis of intestine, part unspecified, without perforation or abscess without bleeding: Secondary | ICD-10-CM

## 2022-12-04 DIAGNOSIS — G20C Parkinsonism, unspecified: Secondary | ICD-10-CM | POA: Diagnosis not present

## 2022-12-04 NOTE — Telephone Encounter (Signed)
Pt advised of his appt with imaging tomorrow and Dr.Blyth's instructions. He stated he is not able to drive at night, asked pt if he had any emergency contacts that could take him and he said his ins provides transportation. Advised him to call and set that up, he stated he would try but no promises. Advised pt to call us if he has any issues and needs assistance with transportation.

## 2022-12-04 NOTE — Progress Notes (Signed)
Subjective:   By signing my name below, I, Kellie Simmering, attest that this documentation has been prepared under the direction and in the presence of Mosie Lukes, MD., 12/04/2022.   Patient ID: Devin [name], male    DOB: 1940-11-26, 82 y.o.   MRN: [id number]  Chief Complaint  Patient presents with   Follow-up    Follow up for Diverticulosis   HPI Patient is in today for an office visit. He denies CP/palpitations/SOB/HA/congestion/ fever/chills/or GU symptoms.  Abdominal Pain/Recurrent Diverticulitis Patient presents today with complaints of lower abdominal pain and recurrent diverticulitis. He reports that last night, he experienced lower abdominal pain around his naval region that lasted intermittently for 40 minutes. Denies fever/chills/myalgias/ nausea/vomiting. He has history of rectal cancer and rectal impaction. He states he has had 4 episodes of diverticulitis thus far and is requesting a referral to surgery. He was seen by Dr. Larose Kells on 11/25/2022 who ordered a CT scan of the abdomen and pelvis. Impression: 1. Findings above consistent with acute sigmoid diverticulitis without evidence of frank perforation or abscess. Consider outpatient colonoscopy after resolution of symptoms to exclude underlying mass if the patient is not currently undergoing screening colonoscopy. 2. Eccentric thickening of the bladder wall along the left aspect of the dome which appears somewhat tethered toward the region of diverticulitis likely reflecting reactive inflammation. No frank fistula or air within the bladder. 3. Nodular surface contour of the liver may reflect cirrhosis. 4. Subacute fractures of the left ninth through twelfth ribs.  Depression Patient is crying during today's visit and states that he is feeling depressed. He is mourning the loss of one his sons and has and has an estranged relationship with the other son.   Past Medical History:  Diagnosis Date   Arthritis    Cataract     COPD (chronic obstructive pulmonary disease) ([place])    Diverticulosis    pt states he had diverticuli in his bladder also.   H/O [facility] spotted fever 01/27/2014   Hematuria    over 5 years ago   Hernia of abdominal cavity    History of rectal polyps    Hyperplastic colon polyp    Hypertension    Macular degeneration    Rectal cancer ([place]) 1994   Shoulder pain, left    per pt, he can lay on the left side!   Tremor 10/21/2018   Tubular adenoma    01/05/2013    Past Surgical History:  Procedure Laterality Date   APPENDECTOMY     COLON SURGERY     rectal cancer ~ 1999   EYE SURGERY     cataract sx   HERNIA REPAIR     IR KYPHO LUMBAR INC FX REDUCE BONE BX UNI/BIL CANNULATION INC/IMAGING  06/12/2017   IR RADIOLOGIST EVAL & MGMT  08/06/2017   mastoid gland removal     left ear   PROSTATE SURGERY  05/2015   TONSILLECTOMY      Family History  Problem Relation Age of Onset   Breast cancer Mother    Colon cancer Mother 69       died 58   Aneurysm Father    Heart attack Father        33   Cancer Maternal Grandmother    Cancer Paternal Grandmother    Stroke Paternal Grandfather    Aneurysm Son        aorta   Prostate cancer Neg Hx    Esophageal cancer Neg Hx  Pancreatic cancer Neg Hx    Rectal cancer Neg Hx    Stomach cancer Neg Hx    Parkinson's disease Neg Hx     Social History   Socioeconomic History   Marital status: Legally Separated    Spouse name:     Number of children: 2   Years of education: Not on file   Highest education level: Not on file  Occupational History   Occupation: Retired- Pharmacologist  Tobacco Use   Smoking status: Former    Types: Cigarettes    Quit date: 04/19/1989    Years since quitting: 33.6   Smokeless tobacco: Never  Vaping Use   Vaping Use: Never used  Substance and Sexual Activity   Alcohol use: Yes    Comment: 2 glasses wine qhs   Drug use: No   Sexual activity: Not Currently  Other Topics Concern   Not on file   Social History Narrative   Moved from [place] ~ 05-Jan-2010   Youngest son lives in [place] reason why he moved here        Separated from wife 09/2018        For former wife (now separated)  this is her second marriage.   She lost her first husband.   Aaron Edelman adopted their son  [name] who died in 01-05-10, he was an alcoholic.   They have together  one biological son.  He lives in [place].      Right handed   Occasional caffeine usage     Social Determinants of Health   Financial Resource Strain: Low Risk  (05/09/2020)   Overall Financial Resource Strain (CARDIA)    Difficulty of Paying Living Expenses: Not hard at all  Food Insecurity: No Food Insecurity (05/09/2020)   Hunger Vital Sign    Worried About Running Out of Food in the Last Year: Never true    Ran Out of Food in the Last Year: Never true  Transportation Needs: No Transportation Needs (05/09/2020)   PRAPARE - Hydrologist (Medical): No    Lack of Transportation (Non-Medical): No  Physical Activity: Not on file  Stress: Not on file  Social Connections: Not on file  Intimate Partner Violence: Not on file    Outpatient Medications Prior to Visit  Medication Sig Dispense Refill   amoxicillin-clavulanate (AUGMENTIN) 875-125 MG tablet Take 1 tablet by mouth 2 (two) times daily. 20 tablet 0   carvedilol (COREG) 6.25 MG tablet Take 1 tablet (6.25 mg total) by mouth 2 (two) times daily with a meal. 180 tablet 1   clotrimazole-betamethasone (LOTRISONE) cream Apply topically 2 (two) times daily. 90 g 1   fluorouracil (EFUDEX) 5 % cream      nortriptyline (PAMELOR) 50 MG capsule TAKE 1 CAPSULE AT BEDTIME 90 capsule 2   omeprazole (PRILOSEC) 20 MG capsule Take 1 capsule (20 mg total) by mouth daily before breakfast. 90 capsule 1   No facility-administered medications prior to visit.    Allergies  Allergen Reactions   Escitalopram Other (See Comments)    "made me feel drunk"    Review of Systems   Constitutional:  Negative for chills and fever.  HENT:  Negative for congestion.   Respiratory:  Negative for shortness of breath.   Cardiovascular:  Negative for chest pain and palpitations.  Gastrointestinal:  Positive for abdominal pain (naval region) and constipation. Negative for nausea and vomiting.       (+) diverticulitis.  Genitourinary:  Negative for dysuria, frequency, hematuria and urgency.  Skin:           Neurological:  Negative for headaches.  Psychiatric/Behavioral:  Positive for depression.        Objective:    Physical Exam Constitutional:      General: He is not in acute distress.    Appearance: Normal appearance. He is normal weight. He is not ill-appearing.  HENT:     Head: Normocephalic and atraumatic.     Right Ear: External ear normal.     Left Ear: External ear normal.     Nose: Nose normal.     Mouth/Throat:     Mouth: Mucous membranes are moist.     Pharynx: Oropharynx is clear.  Eyes:     General:        Right eye: No discharge.        Left eye: No discharge.     Extraocular Movements: Extraocular movements intact.     Conjunctiva/sclera: Conjunctivae normal.     Pupils: Pupils are equal, round, and reactive to light.  Cardiovascular:     Rate and Rhythm: Normal rate and regular rhythm.     Pulses: Normal pulses.     Heart sounds: Normal heart sounds. No murmur heard.    No gallop.  Pulmonary:     Effort: Pulmonary effort is normal. No respiratory distress.     Breath sounds: Normal breath sounds. No wheezing or rales.  Abdominal:     General: Bowel sounds are normal.     Palpations: Abdomen is soft.     Tenderness: There is abdominal tenderness in the periumbilical area. There is no guarding.     Comments: 2 cm x 2 cm mass in naval region.  Musculoskeletal:        General: Normal range of motion.     Cervical back: Normal range of motion.     Right lower leg: No edema.     Left lower leg: No edema.  Skin:    General: Skin is warm  and dry.  Neurological:     Mental Status: He is alert and oriented to person, place, and time.  Psychiatric:        Mood and Affect: Mood normal.        Behavior: Behavior normal.        Judgment: Judgment normal.     BP 122/72 (BP Location: Right Arm, Patient Position: Sitting, Cuff Size: Normal)   Pulse 73   Temp 98 F (36.7 C) (Oral)   Resp 16   Ht 6' (1.829 m)   Wt 204 lb 3.2 oz (92.6 kg)   SpO2 95%   BMI 27.69 kg/m  Wt Readings from Last 3 Encounters:  12/04/22 204 lb 3.2 oz (92.6 kg)  11/25/22 204 lb 8 oz (92.8 kg)  11/04/22 202 lb 9.6 oz (91.9 kg)    Diabetic Foot Exam - Simple   No data filed    Lab Results  Component Value Date   WBC 7.3 12/04/2022   HGB 14.2 12/04/2022   HCT 41.7 12/04/2022   PLT 206.0 12/04/2022   GLUCOSE 98 12/04/2022   CHOL 150 10/03/2022   TRIG 86.0 10/03/2022   HDL 57.10 10/03/2022   LDLCALC 75 10/03/2022   ALT 16 12/04/2022   AST 18 12/04/2022   NA 138 12/04/2022   K 4.1 12/04/2022   CL 101 12/04/2022   CREATININE 0.74 12/04/2022   BUN 12 12/04/2022   CO2 28 12/04/2022  TSH 2.50 10/03/2022   PSA 3.4 02/06/2015   INR 0.96 06/11/2017   HGBA1C 5.7 10/03/2022    Lab Results  Component Value Date   TSH 2.50 10/03/2022   Lab Results  Component Value Date   WBC 7.3 12/04/2022   HGB 14.2 12/04/2022   HCT 41.7 12/04/2022   MCV 91.0 12/04/2022   PLT 206.0 12/04/2022   Lab Results  Component Value Date   NA 138 12/04/2022   K 4.1 12/04/2022   CO2 28 12/04/2022   GLUCOSE 98 12/04/2022   BUN 12 12/04/2022   CREATININE 0.74 12/04/2022   BILITOT 0.4 12/04/2022   ALKPHOS 54 12/04/2022   AST 18 12/04/2022   ALT 16 12/04/2022   PROT 6.5 12/04/2022   ALBUMIN 3.9 12/04/2022   CALCIUM 9.5 12/04/2022   ANIONGAP 8 02/15/2019   GFR 85.39 12/04/2022   Lab Results  Component Value Date   CHOL 150 10/03/2022   Lab Results  Component Value Date   HDL 57.10 10/03/2022   Lab Results  Component Value Date   LDLCALC 75  10/03/2022   Lab Results  Component Value Date   TRIG 86.0 10/03/2022   Lab Results  Component Value Date   CHOLHDL 3 10/03/2022   Lab Results  Component Value Date   HGBA1C 5.7 10/03/2022      Assessment & Plan:  Abdominal Pain/Recurrent Diverticulitis: CT scan of the abdomen ordered.     Referral placed to [facility]. Recommended otc docusate to manage constipation.  Labs: Routine blood work ordered. Problem List Items Addressed This Visit     Anxiety and depression    He is tearful in his visit about his state of health and his relationship with his only living son and the death of his other son. He declines medications at this time.      Diverticulitis    He was started on Augmentin last week after developing abdominal pain secondary to diverticular flare. Initially he felt better but now he developed some pain lower last night. Currently actually feels ok but palpation does reveal a roughly 2 cm firm lesion on left abdominal nearly flank region about mid abdomen. Some tenderness noted also. Continue Augmentin for now but would like to repeat CT scan due to mass and a known history of requiring surgery in the past for diverticuli. He returns to [place] in about 3 months so is interested in a surgical consult to discuss his options. If his symptoms worsen he will present to ED. Spent 30 minutes on history, current state and plan of care      Relevant Orders   Ambulatory referral to General Surgery   CT Abdomen Pelvis W Contrast   Comp Met (CMET) (Completed)   CBC w/Diff (Completed)   Essential hypertension - Primary    Well controlled, no changes to meds. Encouraged heart healthy diet such as the DASH diet and exercise as tolerated.        Other Visit Diagnoses     Abdominal pain, unspecified abdominal location       Relevant Orders   CT Abdomen Pelvis W Contrast   Comp Met (CMET) (Completed)   CBC w/Diff (Completed)      No orders of the defined  types were placed in this encounter.  I, Penni Homans, MD, personally preformed the services described in this documentation.  All medical record entries made by the scribe were at my direction and in my presence.  I have reviewed  the chart and discharge instructions (if applicable) and agree that the record reflects my personal performance and is accurate and complete. 12/04/2022  I,Mohammed Iqbal,acting as a scribe for Penni Homans, MD.,have documented all relevant documentation on the behalf of Penni Homans, MD,as directed by  Penni Homans, MD while in the presence of Penni Homans, MD.  Penni Homans, MD

## 2022-12-04 NOTE — Patient Instructions (Signed)
Diverticulitis  Diverticulitis happens when poop (stool) and bacteria get trapped in small pouches in the colon called diverticula. These pouches may form if you have a condition called diverticulosis. When the poop and bacteria get trapped, it can cause an infection and inflammation. Diverticulitis may cause severe stomach pain and diarrhea. It can also lead to tissue damage in your colon. This can cause bleeding or blockage. In some cases, the diverticula may burst (rupture). This can cause infected poop to go into other parts of your abdomen. What are the causes? This condition is caused by poop getting trapped in the diverticula. This allows bacteria to grow. It can lead to inflammation and infection. What increases the risk? You are more likely to get this condition if you have diverticulosis. You are also more at risk if: You are overweight or obese. You do not get enough exercise. You drink alcohol. You smoke. You eat a lot of red meat, such as beef, pork, or lamb. You do not get enough fiber. Foods high in fiber include fruits, vegetables, beans, nuts, and whole grains. You are over 80 years of age. What are the signs or symptoms? Symptoms of this condition may include: Pain and tenderness in the abdomen. This pain is often felt on the left side but may occur in other spots. Fever and chills. Nausea and vomiting. Cramping. Bloating. Changes in how often you poop. Blood in your poop. How is this diagnosed? This condition is diagnosed based on your medical history and a physical exam. You may also have tests done to make sure there is nothing else causing your condition. These tests may include: Blood tests. Tests done on your pee (urine). A CT scan of the abdomen. You may need to have a colonoscopy. This is an exam to look at your whole large intestine. During the exam, a tube is put into the opening of your butt (anus) and then moved into your rectum, colon, and other parts of  the large intestine. This exam is done to look at the diverticula. It can also see if there is something else that may be causing your symptoms. How is this treated? Most cases are mild and can be treated at home. You may be told to: Take over-the-counter pain medicine. Only eat and drink clear liquids. Take antibiotics. Rest. More severe cases may need to be treated at a hospital. Treatment may include: Not eating or drinking. Taking pain medicines. Getting antibiotics through an IV. Getting fluids and nutrition through an IV. Surgery. Follow these instructions at home: Medicines Take over-the-counter and prescription medicines only as told by your health care provider. These include fiber supplements, probiotics, and medicines to soften your poop (stool softeners). If you were prescribed antibiotics, take them as told by your provider. Do not stop using the antibiotic even if you start to feel better. Ask your provider if the medicine prescribed to you requires you to avoid driving or using machinery. Eating and drinking  Follow the diet told by your provider. You may need to only eat and drink liquids. After your symptoms get better, you may be able to return to a more normal diet. You may be told to eat at least 25 grams (25 g) of fiber each day. Fiber makes it easier to poop. Healthy sources of fiber include: Berries. One cup has 4-8 g of fiber. Beans or lentils. One-half cup has 5-8 g of fiber. Green vegetables. One cup has 4 g of fiber. Avoid eating red meat.  General instructions Do not use any products that contain nicotine or tobacco. These products include cigarettes, chewing tobacco, and vaping devices, such as e-cigarettes. If you need help quitting, ask your provider. Exercise for at least 30 minutes, 3 times a week. Exercise hard enough to raise your heart rate and break a sweat. Contact a health care provider if: Your pain gets worse. Your pooping does not go back to  normal. Your symptoms do not get better with treatment. Your symptoms get worse all of a sudden. You have a fever. You vomit more than one time. Your poop is bloody, black, or tarry. This information is not intended to replace advice given to you by your health care provider. Make sure you discuss any questions you have with your health care provider. Document Revised: 06/12/2022 Document Reviewed: 06/12/2022 Elsevier Patient Education  [place].

## 2022-12-04 NOTE — Telephone Encounter (Signed)
Pt said he learned that the imaging would cost him $300 so he cannot do that. Please call to advise.

## 2022-12-05 ENCOUNTER — Ambulatory Visit (HOSPITAL_BASED_OUTPATIENT_CLINIC_OR_DEPARTMENT_OTHER): Admission: RE | Admit: 2022-12-05 | Payer: Medicare HMO | Source: Ambulatory Visit

## 2022-12-05 LAB — CBC WITH DIFFERENTIAL/PLATELET
Basophils Absolute: 0.1 10*3/uL (ref 0.0–0.1)
Basophils Relative: 0.9 % (ref 0.0–3.0)
Eosinophils Absolute: 0.2 10*3/uL (ref 0.0–0.7)
Eosinophils Relative: 2.2 % (ref 0.0–5.0)
HCT: 41.7 % (ref 39.0–52.0)
Hemoglobin: 14.2 g/dL (ref 13.0–17.0)
Lymphocytes Relative: 35.9 % (ref 12.0–46.0)
Lymphs Abs: 2.6 10*3/uL (ref 0.7–4.0)
MCHC: 34 g/dL (ref 30.0–36.0)
MCV: 91 fl (ref 78.0–100.0)
Monocytes Absolute: 0.6 10*3/uL (ref 0.1–1.0)
Monocytes Relative: 7.9 % (ref 3.0–12.0)
Neutro Abs: 3.9 10*3/uL (ref 1.4–7.7)
Neutrophils Relative %: 53.1 % (ref 43.0–77.0)
Platelets: 206 10*3/uL (ref 150.0–400.0)
RBC: 4.58 Mil/uL (ref 4.22–5.81)
RDW: 13.1 % (ref 11.5–15.5)
WBC: 7.3 10*3/uL (ref 4.0–10.5)

## 2022-12-05 LAB — COMPREHENSIVE METABOLIC PANEL
ALT: 16 U/L (ref 0–53)
AST: 18 U/L (ref 0–37)
Albumin: 3.9 g/dL (ref 3.5–5.2)
Alkaline Phosphatase: 54 U/L (ref 39–117)
BUN: 12 mg/dL (ref 6–23)
CO2: 28 mEq/L (ref 19–32)
Calcium: 9.5 mg/dL (ref 8.4–10.5)
Chloride: 101 mEq/L (ref 96–112)
Creatinine, Ser: 0.74 mg/dL (ref 0.40–1.50)
GFR: 85.39 mL/min (ref >60.00)
Glucose, Bld: 98 mg/dL (ref 70–99)
Potassium: 4.1 mEq/L (ref 3.5–5.1)
Sodium: 138 mEq/L (ref 135–145)
Total Bilirubin: 0.4 mg/dL (ref 0.2–1.2)
Total Protein: 6.5 g/dL (ref 6.0–8.3)

## 2022-12-05 NOTE — Assessment & Plan Note (Addendum)
He was started on Augmentin last week after developing abdominal pain secondary to diverticular flare. Initially he felt better but now he developed some pain lower last night. Currently actually feels ok but palpation does reveal a roughly 2 cm firm lesion on left abdominal nearly flank region about mid abdomen. Some tenderness noted also. Continue Augmentin for now but would like to repeat CT scan due to mass and a known history of requiring surgery in the past for diverticuli. He returns to [place] in about 3 months so is interested in a surgical consult to discuss his options. If his symptoms worsen he will present to ED. Spent 30 minutes on history, current state and plan of care

## 2022-12-05 NOTE — Assessment & Plan Note (Signed)
He is tearful in his visit about his state of health and his relationship with his only living son and the death of his other son. He declines medications at this time.

## 2022-12-05 NOTE — Telephone Encounter (Signed)
Called pt was advised,Pt stated he called his insurance and  They got the cost down to $200 so, he will be proceeding with the Imaging now.Pt  Stated he goes tomorrow and he meets with the Surgeon on Monday and Pt said thank you for everything.

## 2022-12-05 NOTE — Progress Notes (Unsigned)
Assessment/Plan:   1.  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  While this does correlate with a diagnosis of Parkinson's disease, the degree of which he has lost dopamine, certainly does not correlate with patient's symptoms, which are incredibly mild.  -Patient and I discussed medication.  He would like to start on medication before he goes back to [place].  We discussed levodopa.  We will start carbidopa/levodopa 25/100 and over the next month we will work to 1 tablet at 7 AM/11 AM/4 PM.  Discussed risk, benefits, and side effects.  -He asked me to write a letter to his doctor in [place] so that he could get a referral to neurology in [place].  Apparently, without such a letter he would be treated by his general practitioner and patient would like neurology treatment.  Letter was written for him to take to [place].  2.  Dysphagia  -Patient had modified barium swallow on November 12, 2022.  They noted just mild changes and recommended regular diet with thin liquid and speech therapy for hypophonia.  3.  Sialorrhea  -This is commonly associated with PD.  We talked about treatments.  The patient is not a candidate for oral anticholinergic therapy because of increased risk of confusion and falls.  We discussed Botox (type A and B) and 1% atropine drops.  We discusssed that candy like lemon drops can help by stimulating mm of the oropharynx to induce swallowing.  4.  Left shoulder pain  -Patient was getting ready to have reverse shoulder replacement.  It was scheduled for May 7 but he has decided not to do it.  He is moving back to [place]  5.  Adjustment d/o  -Patient currently with stressful family situation and plans to move back to [place] in April.  6.  As above, patient is moving back to [place].  He will follow-up with me as needed. Subjective:   Devin [name] was seen today in follow up for results of testing.   He just had his DaTscan done last week.  I personally reviewed it.  There was marked reduction of radiotracer activity bilaterally.  Patient had modified barium swallow on November 12, 2022.  They noted just mild changes and recommended regular diet with thin liquid and speech therapy for hypophonia.  He states he has just been dx with diverticulitis and he is now on abx.  B/c of this, his shoulder sx was delayed and he doesn't want to wait for that so plans to go to [place] and do the shoulder sx there.     ALLERGIES:   Allergies  Allergen Reactions   Escitalopram Other (See Comments)    "made me feel drunk"    CURRENT MEDICATIONS:  Current Meds  Medication Sig   amoxicillin-clavulanate (AUGMENTIN) 875-125 MG tablet Take 1 tablet by mouth 2 (two) times daily.   carvedilol (COREG) 6.25 MG tablet Take 1 tablet (6.25 mg total) by mouth 2 (two) times daily with a meal.   clotrimazole-betamethasone (LOTRISONE) cream Apply topically 2 (two) times daily.   fluorouracil (EFUDEX) 5 % cream    nortriptyline (PAMELOR) 50 MG capsule TAKE 1 CAPSULE AT BEDTIME   omeprazole (PRILOSEC) 20 MG capsule Take 1 capsule (20 mg total) by mouth daily before breakfast.     Objective:   PHYSICAL EXAMINATION:    VITALS:   Vitals:   12/08/22 1433  BP: 122/80  Pulse: 63  SpO2: 99%  Weight:  203 lb 3.2 oz (92.2 kg)  Height: '6\' 2"'$  (1.88 m)    GEN:  The patient appears stated age and is in NAD. HEENT:  Normocephalic, atraumatic.  The mucous membranes are moist. The superficial temporal arteries are without ropiness or tenderness. CV:  RRR Lungs:  CTAB Neck/HEME:  There are no carotid bruits bilaterally.  Neurological examination:  Orientation: The patient is alert and oriented x3. Cranial nerves: There is good facial symmetry with facial hypomimia. The speech is fluent and clear. Soft palate rises symmetrically and there is no tongue deviation. Hearing is intact to conversational tone. Sensation:  Sensation is intact to light touch throughout Motor: Strength is at least antigravity x4.  Movement examination: Tone: There is mild increased tone in the right upper extremity. Abnormal movements: there is rare rest tremor in the RUE Coordination:  There is min decremation with RAM's, with any form of RAMS, including alternating supination and pronation of the forearm, hand opening and closing, finger taps, heel taps and toe taps.  Gait and Station: The patient has difficulty arising out of a deep-seated chair without the use of the hands. The patient's stride length is good with good arm swing  I have reviewed and interpreted the following labs independently    Chemistry      Component Value Date/Time   NA 138 12/04/2022 1400   NA 140 02/06/2015 0000   K 4.1 12/04/2022 1400   CL 101 12/04/2022 1400   CO2 28 12/04/2022 1400   BUN 12 12/04/2022 1400   BUN 13 02/06/2015 0000   CREATININE 0.74 12/04/2022 1400   CREATININE 0.79 11/27/2015 1024   GLU 95 02/06/2015 0000      Component Value Date/Time   CALCIUM 9.5 12/04/2022 1400   ALKPHOS 54 12/04/2022 1400   AST 18 12/04/2022 1400   ALT 16 12/04/2022 1400   BILITOT 0.4 12/04/2022 1400       Lab Results  Component Value Date   WBC 7.3 12/04/2022   HGB 14.2 12/04/2022   HCT 41.7 12/04/2022   MCV 91.0 12/04/2022   PLT 206.0 12/04/2022    Lab Results  Component Value Date   TSH 2.50 10/03/2022     Total time spent on today's visit was 30 minutes, including both face-to-face time and nonface-to-face time.  Time included that spent on review of records (prior notes available to me/labs/imaging if pertinent), discussing treatment and goals, answering patient's questions and coordinating care.  Cc:  Colon Branch, MD

## 2022-12-06 ENCOUNTER — Ambulatory Visit (HOSPITAL_BASED_OUTPATIENT_CLINIC_OR_DEPARTMENT_OTHER)
Admission: RE | Admit: 2022-12-06 | Discharge: 2022-12-06 | Disposition: A | Discharge status: Home / Self Care | Payer: Medicare HMO | Source: Ambulatory Visit | Attending: Family Medicine | Admitting: Family Medicine

## 2022-12-06 ENCOUNTER — Ambulatory Visit (HOSPITAL_BASED_OUTPATIENT_CLINIC_OR_DEPARTMENT_OTHER): Payer: Medicare HMO

## 2022-12-06 DIAGNOSIS — K5792 Diverticulitis of intestine, part unspecified, without perforation or abscess without bleeding: Secondary | ICD-10-CM | POA: Diagnosis not present

## 2022-12-06 DIAGNOSIS — R109 Unspecified abdominal pain: Secondary | ICD-10-CM | POA: Insufficient documentation

## 2022-12-06 MED ORDER — IOHEXOL 300 MG/ML  SOLN
100.0000 mL | Freq: Once | INTRAMUSCULAR | Status: AC | PRN
  Administered 2022-12-06: 100 mL via INTRAVENOUS

## 2022-12-06 MED ORDER — IOHEXOL 350 MG/ML SOLN: 100.0000 mL | Freq: Once | INTRAVENOUS | Status: DC | PRN

## 2022-12-08 ENCOUNTER — Ambulatory Visit: Payer: Medicare HMO | Admitting: Neurology

## 2022-12-08 ENCOUNTER — Encounter: Payer: Self-pay | Admitting: Neurology

## 2022-12-08 VITALS — BP 122/80 | HR 63 | Ht 74.0 in | Wt 203.2 lb

## 2022-12-08 DIAGNOSIS — K5732 Diverticulitis of large intestine without perforation or abscess without bleeding: Secondary | ICD-10-CM | POA: Diagnosis not present

## 2022-12-08 DIAGNOSIS — G20A1 Parkinson's disease without dyskinesia, without mention of fluctuations: Secondary | ICD-10-CM

## 2022-12-08 MED ORDER — CARBIDOPA-LEVODOPA 25-100 MG PO TABS: 1.0000 | ORAL_TABLET | Freq: Three times a day (TID) | ORAL | 1 refills | Status: DC

## 2022-12-08 NOTE — Patient Instructions (Addendum)
Start Carbidopa Levodopa as follows: Take 1/2 tablet three times daily, at least 30 minutes before meals (approximately 7am/11am/4pm), for one week Then take 1/2 tablet in the morning, 1/2 tablet in the afternoon, 1 tablet in the evening, at least 30 minutes before meals, for one week Then take 1/2 tablet in the morning, 1 tablet in the afternoon, 1 tablet in the evening, at least 30 minutes before meals, for one week Then take 1 tablet three times daily at 7am/11am/4pm, at least 30 minutes before meals   As a reminder, carbidopa/levodopa can be taken at the same time as a carbohydrate, but we like to have you take your pill either 30 minutes before a protein source or 1 hour after as protein can interfere with carbidopa/levodopa absorption.  Local and Online Resources for Power over Parkinson's Group  March 2024   LOCAL [place] PARKINSON'S GROUPS   Power over Parkinson's Group:    Power Over Parkinson's Patient Education Group will be Wednesday, March 13th-*Hybrid meting*- in person at [facility] location and via [facility], 2:00-3:00 pm.   Starting in November 2023, Power over [employer] and Care Partner Groups will meet together, with plans for separate break out session for caregivers (*this will be evolving over the next few months) Upcoming Power over Parkinson's Meetings/Care Partner Support:  2nd Wednesdays of the month at 2 pm:  March 13th, April 10th [place] at [email or web address] if interested in participating in this group    LOCAL EVENTS AND NEW OFFERINGS  Let's Try Pickleball-$25 for 6 weeks of Pickleball, starting February 2nd.  Contact Corwin Levins for more details.  [email or web address] NEW:  Parkinson's Social Game Night.  First Thursday of each month, 2:00-4:00 pm.  *Next date is MARCH 7th*.  [place], [employer].  Contact [email or web address] if interested. Parkinson's CarePartner Group for Men is in the  works, if interested email [email or web address] ACT FITNESS Chair Yoga classes "Train and Gain", Fridays 10 am, [employer].  Contact Gina at [phone].  PWR! Moves [employer] Instructor-Led Classes offering at [employer]!  TUESDAYS and Wednesdays 1-2 pm.   Contact Vonna Kotyk at  [email or web address]  or [phone] (Tuesday classes are modified for chair and standing only) Drumming for Parkinson's will be held on 2nd and 4th Mondays at 11:00 am.   Located at the [place] ([place]  Contact Doylene Canning at [email or web address] or [phone]  Dance for Parkinson 's classes will be on Tuesdays 10-11 am starting in February. Located in the [employer] , in the first floor of the [employer] ([place] To register:  [email or web address] or [phone] [employer] [place] Class, Mondays at 11 am.  Call [phone] for details Moving Day [facility].  Saturday, May 4th, 10 am start.  Register at 11-30-1988.[place]:  [email or web address]  PD Health at Home continues:  Mindfulness Mondays, Wellness Wednesdays, Fitness Fridays  (PWR! Moves as part of Fitness Fridays March 22nd, 1-1:45 pm) Upcoming Education:   Managing "Off" Periods:  Return of Parkinson's Symptoms.  Wednesday, March 20th, 1-2 pm Parkinson's 101.  Wednesday, April 3rd, 1-2 pm Expert Briefing:  Understanding Pain in Parkinson's.   Wednesday, March 13th, 1-2 pm  Research Update:  Working to 12-27-1976 PD.  Wednesday, April 10th, 1-2 pm Register for virtual education and 01-25-1998 (webinars) at Patent attorney Please check out their website to sign  up for emails and see their full online offerings     [place]:  [email or web address]   Third Thursday Webinars:  On the  third Thursday of every month at 12 p.m. ET, join our free live webinars to learn about various aspects of living with Parkinson's disease and our work to speed medical breakthroughs.  Upcoming Webinar:  Everyday Exposure to Parkinson's:  Environmental Connections to the Disease.  Thursday, March 21st at 12 noon. Check out additional information on their website to see their full online offerings    [facility]:  [email or web address]  Upcoming Webinar:   Nutrition and Parkinson's.  Wednesday, March 6th, 12 noon Webinar Series:  Living with Parkinson's Meetup.   Third Thursdays each month, 3 pm  Care Partner Monthly Meetup.  With Robin Searing Phinney.  First Tuesday of each month, 2 pm  Check out additional information to Live Well Today on their website    Parkinson and Movement Disorders (PMD) Alliance:  www.[email or web address]  [employer]:  Online Education Events  Sign up for emails, which are sent weekly to give you updates on programming and online offerings    Parkinson's Association of the [place]:  www.[email or web address]  Information on online support groups, education events, and online exercises including Yoga, Parkinson's exercises and more-LOTS of information on links to PD resources and online events  Virtual Support Group through Parkinson's Association of the [place]; next one is scheduled for Wednesday, March 6th  MOVEMENT AND EXERCISE OPPORTUNITIES  PWR! Moves Classes at [place].  Wednesdays 10 and 11 am.   Contact Amy Marriott, PT [email or web address] if interested.  PWR! Moves Class offerings at [employer]. *TUESDAYS* and Wednesdays 1-2 pm.    Contact Vonna Kotyk at  [email or web address]    Parkinson's Wellness Recovery (PWR! Moves)  [email or web address].[email or web address]  Info on the PWR! Virtual Experience:  You will have access to our expertise?through self-assessment, guided plans that start with the PD-specific  fundamentals, educational content, tips, Q&A with an expert, and a growing Art therapist of PD-specific pre-recorded and live exercise classes of varying types and intensity - both physical and cognitive! If that is not enough, we offer 1:1 wellness consultations (in-person or virtual) to personalize your PWR! Research scientist (medical).   [place] Fridays:   As part of the PD Health @ Home program, this free video series focuses each week on one aspect of fitness designed to support people living with Parkinson's.? These weekly videos highlight the [place] fitness guidelines for people with Parkinson's disease.  ModemGamers.[email or web address]  Dance for PD website is offering free, live-stream classes throughout the week, as well as links to [employer] of classes:  [email or web address]  Virtual dance and Pilates for Parkinson's classes: Click on the Community Tab> Parkinson's Movement Initiative Tab.  To register for classes and for more information, visit [email or web address] and click the "community" tab.   [employer] Parkinson's Cycling Classes   [facility]:  Thursdays @ Noon-Live classes at [employer] ([employer] at [email or web address]?or [phone])  [facility]: Virtual Classes Mondays and Thursdays Jeanette Caprice classes Tuesday, Wednesday and Thursday (contact [place] at [email or web address] ?or [phone])  [place]  Varied levels of classes are offered Tuesdays and Thursdays at [employer].   Stretching with Verdis Frederickson weekly class is also offered for people with Parkinson's  To observe a class or for more information, call [phone] or email Hezzie Bump at [email or web address]   ADDITIONAL SUPPORT AND RESOURCES  [place] Caregiver Education Opportunities:  www.well-[email or web address]/caregiver-education/[email or web address]-support-group.  You may also contact Vickki Muff at [email or web address] or [phone].     Family Caregiver [phone].  Thursday, March 7th, 10:15-1:45 at [facility].  Register with [facility] (see above) Well-Spring Navigator:  Just1Navigator program, a?free service to help individuals and families through the journey of determining care for older adults.  The "Navigator" is a Vickki Muff, Education officer, museum, who will speak with a prospective client and/or loved ones to provide an assessment of the situation and a set of recommendations for a personalized care plan -- all free of charge, and whether?Well-Spring [employer] offers the needed service or not. If the need is not a service we provide, we are well-connected with reputable programs in town that we can refer you to.  www.well-springsolutions.org or to speak with the Navigator, call [phone].

## 2022-12-11 DIAGNOSIS — H353121 Nonexudative age-related macular degeneration, left eye, early dry stage: Secondary | ICD-10-CM | POA: Diagnosis not present

## 2022-12-11 DIAGNOSIS — Z961 Presence of intraocular lens: Secondary | ICD-10-CM | POA: Diagnosis not present

## 2022-12-11 DIAGNOSIS — H35453 Secondary pigmentary degeneration, bilateral: Secondary | ICD-10-CM | POA: Diagnosis not present

## 2022-12-11 DIAGNOSIS — H35373 Puckering of macula, bilateral: Secondary | ICD-10-CM | POA: Diagnosis not present

## 2022-12-11 DIAGNOSIS — H353211 Exudative age-related macular degeneration, right eye, with active choroidal neovascularization: Secondary | ICD-10-CM | POA: Diagnosis not present

## 2022-12-11 DIAGNOSIS — H35363 Drusen (degenerative) of macula, bilateral: Secondary | ICD-10-CM | POA: Diagnosis not present

## 2022-12-16 DIAGNOSIS — L821 Other seborrheic keratosis: Secondary | ICD-10-CM | POA: Diagnosis not present

## 2022-12-16 DIAGNOSIS — D225 Melanocytic nevi of trunk: Secondary | ICD-10-CM | POA: Diagnosis not present

## 2022-12-16 DIAGNOSIS — Z129 Encounter for screening for malignant neoplasm, site unspecified: Secondary | ICD-10-CM | POA: Diagnosis not present

## 2022-12-16 DIAGNOSIS — D0439 Carcinoma in situ of skin of other parts of face: Secondary | ICD-10-CM | POA: Diagnosis not present

## 2022-12-24 ENCOUNTER — Telehealth: Payer: Self-pay | Admitting: Internal Medicine

## 2022-12-24 MED ORDER — CARVEDILOL 6.25 MG PO TABS: 6.2500 mg | ORAL_TABLET | Freq: Two times a day (BID) | ORAL | 0 refills | Status: DC

## 2022-12-24 NOTE — Telephone Encounter (Signed)
[facility] called requesting a short fill of the patient's carvedilol to be sent to [facility], to hold him over until his mail order gets to him. Please advise.     [place], [place] [address] [place] [address] Phone: [phone]  Fax: [phone]

## 2022-12-24 NOTE — Telephone Encounter (Signed)
Rx sent 

## 2022-12-30 ENCOUNTER — Encounter: Payer: Self-pay | Admitting: Internal Medicine

## 2022-12-30 ENCOUNTER — Ambulatory Visit (INDEPENDENT_AMBULATORY_CARE_PROVIDER_SITE_OTHER): Payer: Medicare HMO | Admitting: *Deleted

## 2022-12-30 ENCOUNTER — Ambulatory Visit (INDEPENDENT_AMBULATORY_CARE_PROVIDER_SITE_OTHER): Payer: Medicare HMO | Admitting: Internal Medicine

## 2022-12-30 VITALS — BP 127/73 | HR 71 | Ht 74.0 in | Wt 198.2 lb

## 2022-12-30 VITALS — BP 144/79 | HR 74 | Temp 98.4°F | Resp 16 | Wt 198.0 lb

## 2022-12-30 DIAGNOSIS — Z0000 Encounter for general adult medical examination without abnormal findings: Secondary | ICD-10-CM

## 2022-12-30 NOTE — Patient Instructions (Signed)
Continue same medications. Please be careful, avoid falls .  Reach out if I can help in the future   Fall Prevention in the Home, Adult Falls can cause injuries and affect people of all ages. There are many simple things that you can do to make your home safe and to help prevent falls. If you need it, ask for help making these changes. What actions can I take to prevent falls? General information Use good lighting in all rooms. Make sure to: Replace any light bulbs that burn out. Turn on lights if it is dark and use night-lights. Keep items that you use often in easy-to-reach places. Lower the shelves around your home if needed. Move furniture so that there are clear paths around it. Do not keep throw rugs or other things on the floor that can make you trip. If any of your floors are uneven, fix them. Add color or contrast paint or tape to clearly mark and help you see: Grab bars or handrails. First and last steps of staircases. Where the edge of each step is. If you use a ladder or stepladder: Make sure that it is fully opened. Do not climb a closed ladder. Make sure the sides of the ladder are locked in place. Have someone hold the ladder while you use it. Know where your pets are as you move through your home. What can I do in the bathroom?     Keep the floor dry. Clean up any water that is on the floor right away. Remove soap buildup in the bathtub or shower. Buildup makes bathtubs and showers slippery. Use non-skid mats or decals on the floor of the bathtub or shower. Attach bath mats securely with double-sided, non-slip rug tape. If you need to sit down while you are in the shower, use a non-slip stool. Install grab bars by the toilet and in the bathtub and shower. Do not use towel bars as grab bars. What can I do in the bedroom? Make sure that you have a light by your bed that is easy to reach. Do not use any sheets or blankets on your bed that hang to the floor. Have a  firm bench or chair with side arms that you can use for support when you get dressed. What can I do in the kitchen? Clean up any spills right away. If you need to reach something above you, use a sturdy step stool that has a grab bar. Keep electrical cables out of the way. Do not use floor polish or wax that makes floors slippery. What can I do with my stairs? Do not leave anything on the stairs. Make sure that you have a light switch at the top and the bottom of the stairs. Have them installed if you do not have them. Make sure that there are handrails on both sides of the stairs. Fix handrails that are broken or loose. Make sure that handrails are as long as the staircases. Install non-slip stair treads on all stairs in your home if they do not have carpet. Avoid having throw rugs at the top or bottom of stairs, or secure the rugs with carpet tape to prevent them from moving. Choose a carpet design that does not hide the edge of steps on the stairs. Make sure that carpet is firmly attached to the stairs. Fix any carpet that is loose or worn. What can I do on the outside of my home? Use bright outdoor lighting. Repair the edges of walkways  and driveways and fix any cracks. Clear paths of anything that can make you trip, such as tools or rocks. Add color or contrast paint or tape to clearly mark and help you see high doorway thresholds. Trim any bushes or trees on the main path into your home. Check that handrails are securely fastened and in good repair. Both sides of all steps should have handrails. Install guardrails along the edges of any raised decks or porches. Have leaves, snow, and ice cleared regularly. Use sand, salt, or ice melt on walkways during winter months if you live where there is ice and snow. In the garage, clean up any spills right away, including grease or oil spills. What other actions can I take? Review your medicines with your health care provider. Some medicines can  make you confused or feel dizzy. This can increase your chance of falling. Wear closed-toe shoes that fit well and support your feet. Wear shoes that have rubber soles and low heels. Use a cane, walker, scooter, or crutches that help you move around if needed. Talk with your provider about other ways that you can decrease your risk of falls. This may include seeing a physical therapist to learn to do exercises to improve movement and strength. Where to find more information Centers for Disease Control and Prevention, [place]: StoreMirror.com.cy [employer] on Aging: AquariamTheater.co.nz National Institute on Aging: AquariamTheater.co.nz Contact a health care provider if: You are afraid of falling at home. You feel weak, drowsy, or dizzy at home. You fall at home. Get help right away if you: Lose consciousness or have trouble moving after a fall. Have a fall that causes a head injury. These symptoms may be an emergency. Get help right away. Call 911. Do not wait to see if the symptoms will go away. Do not drive yourself to the hospital. This information is not intended to replace advice given to you by your health care provider. Make sure you discuss any questions you have with your health care provider. Document Revised: 05/19/2022 Document Reviewed: 05/19/2022 Elsevier Patient Education  [place].

## 2022-12-30 NOTE — Progress Notes (Signed)
Subjective:   Devin [name] is a 82 y.o. male who presents for Medicare Annual/Subsequent preventive examination.  Review of Systems     Cardiac Risk Factors include: advanced age (>45men, >22 women);hypertension;male gender     Objective:    Today's Vitals   12/30/22 1343  BP: 127/73  Pulse: 71  Weight: 198 lb 3.2 oz (89.9 kg)  Height: 6\' 2"  (1.88 m)   Body mass index is 25.45 kg/m.     12/30/2022    1:47 PM 12/08/2022    2:34 PM 11/04/2022    9:28 AM 05/09/2020   10:15 AM 02/15/2019    2:40 PM 10/26/2018   10:22 AM 07/13/2017    9:30 AM  Advanced Directives  Does Patient Have a Medical Advance Directive? Yes No Yes Yes No Yes No  Type of Paramedic of [place];Living will  Living will;Out of facility DNR (pink MOST or yellow form) [place];Living will  [place];Living will   Does patient want to make changes to medical advance directive? No - Patient declined   No - Patient declined  No - Patient declined   Copy of [place] in Chart? No - copy requested   No - copy requested  Yes - validated most recent copy scanned in chart (See row information)     Current Medications (verified) Outpatient Encounter Medications as of 12/30/2022  Medication Sig   carbidopa-levodopa (SINEMET IR) 25-100 MG tablet Take 1 tablet by mouth 3 (three) times daily. 7am/11am/4pm   carvedilol (COREG) 6.25 MG tablet Take 1 tablet (6.25 mg total) by mouth 2 (two) times daily with a meal.   clotrimazole-betamethasone (LOTRISONE) cream Apply topically 2 (two) times daily.   fluorouracil (EFUDEX) 5 % cream    nortriptyline (PAMELOR) 50 MG capsule TAKE 1 CAPSULE AT BEDTIME   omeprazole (PRILOSEC) 20 MG capsule Take 1 capsule (20 mg total) by mouth daily before breakfast.   [DISCONTINUED] amoxicillin-clavulanate (AUGMENTIN) 875-125 MG tablet Take 1 tablet by mouth 2 (two) times daily.   No facility-administered encounter  medications on file as of 12/30/2022.    Allergies (verified) Escitalopram   History: Past Medical History:  Diagnosis Date   Arthritis    Cataract    COPD (chronic obstructive pulmonary disease)    Diverticulosis    pt states he had diverticuli in his bladder also.   H/O [facility] spotted fever 01/27/2014   Hematuria    over 5 years ago   Hernia of abdominal cavity    History of rectal polyps    Hyperplastic colon polyp    Hypertension    Macular degeneration    Rectal cancer 1994   Shoulder pain, left    per pt, he can lay on the left side!   Tremor 10/21/2018   Tubular adenoma    01/05/2013   Past Surgical History:  Procedure Laterality Date   APPENDECTOMY     COLON SURGERY     rectal cancer ~ 1999   EYE SURGERY     cataract sx   HERNIA REPAIR     IR KYPHO LUMBAR INC FX REDUCE BONE BX UNI/BIL CANNULATION INC/IMAGING  06/12/2017   IR RADIOLOGIST EVAL & MGMT  08/06/2017   mastoid gland removal     left ear   PROSTATE SURGERY  05/2015   TONSILLECTOMY     Family History  Problem Relation Age of Onset   Breast cancer Mother  Colon cancer Mother 15       died 10   Aneurysm Father    Heart attack Father        86   Cancer Maternal Grandmother    Cancer Paternal Grandmother    Stroke Paternal Grandfather    Aneurysm Son        aorta   Prostate cancer Neg Hx    Esophageal cancer Neg Hx    Pancreatic cancer Neg Hx    Rectal cancer Neg Hx    Stomach cancer Neg Hx    Parkinson's disease Neg Hx    Social History   Socioeconomic History   Marital status: Legally Separated    Spouse name:     Number of children: 2   Years of education: Not on file   Highest education level: Not on file  Occupational History   Occupation: Retired- Pharmacologist  Tobacco Use   Smoking status: Former    Types: Cigarettes    Quit date: 04/19/1989    Years since quitting: 33.7   Smokeless tobacco: Never  Vaping Use   Vaping Use: Never used  Substance and Sexual Activity    Alcohol use: Yes    Comment: 2 glasses wine qhs   Drug use: No   Sexual activity: Not Currently  Other Topics Concern   Not on file  Social History Narrative   Moved from [place] ~ Feb 03, 2010   Youngest son lives in [place] reason why he moved here        Separated from wife 09/2018        For former wife (now separated)  this is her second marriage.   She lost her first husband.   Aaron Edelman adopted their son  [name] who died in 02-03-2010, he was an alcoholic.   They have together  one biological son.  He lives in [place].      Right handed   Occasional caffeine usage     Social Determinants of Health   Financial Resource Strain: Low Risk  (05/09/2020)   Overall Financial Resource Strain (CARDIA)    Difficulty of Paying Living Expenses: Not hard at all  Food Insecurity: Food Insecurity Present (12/30/2022)   Hunger Vital Sign    Worried About Running Out of Food in the Last Year: Sometimes true    Ran Out of Food in the Last Year: Never true  Transportation Needs: No Transportation Needs (12/30/2022)   PRAPARE - Hydrologist (Medical): No    Lack of Transportation (Non-Medical): No  Physical Activity: Inactive (12/30/2022)   Exercise Vital Sign    Days of Exercise per Week: 0 days    Minutes of Exercise per Session: 0 min  Stress: Not on file  Social Connections: Socially Isolated (12/30/2022)   Social Connection and Isolation Panel [NHANES]    Frequency of Communication with Friends and Family: Once a week    Frequency of Social Gatherings with Friends and Family: Never    Attends Religious Services: Never    Marine scientist or Organizations: No    Attends Music therapist: Never    Marital Status: Divorced    Tobacco Counseling Counseling given: Not Answered   Clinical Intake:  Pre-visit preparation completed: Yes  Pain : No/denies pain     Nutritional Status: BMI 25 -29 Overweight Nutritional Risks: None Diabetes:  No  How often do you need to have someone help you when you read instructions, pamphlets,  or other written materials from your doctor or pharmacy?: 1 - Never   Activities of Daily Living    12/30/2022    1:56 PM  In your present state of health, do you have any difficulty performing the following activities:  Hearing? 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 1  Difficulty concentrating or making decisions? 0  Walking or climbing stairs? 1  Dressing or bathing? 0  Doing errands, shopping? 0  Preparing Food and eating ? N  Using the Toilet? N  In the past six months, have you accidently leaked urine? Y  Do you have problems with loss of bowel control? N  Managing your Medications? N  Managing your Finances? N  Housekeeping or managing your Housekeeping? N    Patient Care Team: Colon Branch, MD as PCP - General (Internal Medicine) Puschinsky, Fransico Him., MD as Consulting Physician (General Surgery) [place] Crane, MD as Consulting Physician (Internal Medicine) Zadie Rhine Clent Demark, MD as Consulting Physician (Ophthalmology)  Indicate any recent Medical Services you may have received from other than [facility] providers in the past year (date may be approximate).     Assessment:   This is a routine wellness examination for [place].  Hearing/Vision screen No results found.  Dietary issues and exercise activities discussed: Current Exercise Habits: The patient does not participate in regular exercise at present, Exercise limited by: neurologic condition(s)   Goals Addressed   None    Depression Screen    12/30/2022    1:54 PM 12/04/2022    1:16 PM 10/03/2022    8:33 AM 10/25/2021    8:52 AM 10/25/2021    8:51 AM 12/13/2020   10:48 AM 05/09/2020   10:21 AM  PHQ 2/9 Scores  PHQ - 2 Score 0 0 0 0 0 0 0  PHQ- 9 Score   0 3  6     Fall Risk    12/30/2022    1:55 PM 12/08/2022    2:33 PM 12/04/2022    1:16 PM 11/04/2022    9:27 AM 10/03/2022    8:44 AM  Fall Risk   Falls in the past year?  1 1 0 1 1  Number falls in past yr: 1 0 0 1 1  Injury with Fall? 0 0 0 0 1  Risk for fall due to : Impaired balance/gait;Other (Comment)      Risk for fall due to: Comment Parkinson's      Follow up Falls evaluation completed Falls evaluation completed Falls evaluation completed Falls evaluation completed Falls evaluation completed    [place]:  Any stairs in or around the home? No  Home free of loose throw rugs in walkways, pet beds, electrical cords, etc? Yes  Adequate lighting in your home to reduce risk of falls? Yes   ASSISTIVE DEVICES UTILIZED TO PREVENT FALLS:  Life alert? No  Use of a cane, walker or w/c? No  Grab bars in the bathroom? No  Shower chair or bench in shower? Yes  Elevated toilet seat or a handicapped toilet? No   TIMED UP AND GO:  Was the test performed? Yes .  Length of time to ambulate 10 feet: 8 sec.   Gait slow and steady without use of assistive device  Cognitive Function:    12/30/2022    2:08 PM 10/21/2018   11:50 AM 09/08/2016   12:46 PM  MMSE - Mini Mental State Exam  Not completed: Unable  to complete    Orientation to time  4 5  Orientation to Place  5 5  Registration  3 3  Attention/ Calculation  5 5  Recall  3 3  Language- name 2 objects  2 2  Language- repeat  1 1  Language- follow 3 step command  3 3  Language- read & follow direction  1 1  Write a sentence  1 1  Copy design  1 1  Total score  29 30        Immunizations Immunization History  Administered Date(s) Administered   Influenza Split 06/29/2022   Influenza, High Dose Seasonal PF 07/12/2018   Influenza,inj,Quad PF,6+ Mos 07/27/2014, 05/30/2015, 06/13/2016, 06/19/2017   Influenza-Unspecified 07/14/2019, 06/29/2020, 07/30/2021   Moderna Covid-19 Vaccine Bivalent Booster 66yrs & up 06/29/2022   Moderna Sars-Covid-2 Vaccination 11/03/2021   PFIZER Comirnaty(Gray Top)Covid-19 Tri-Sucrose Vaccine 01/19/2020, 02/10/2020   PFIZER(Purple  Top)SARS-COV-2 Vaccination 01/19/2020, 02/10/2020   PNEUMOCOCCAL CONJUGATE-20 10/03/2022   Pfizer Covid-19 Vaccine Bivalent Booster 1yrs & up 07/09/2021   Pneumococcal Conjugate-13 07/27/2014   Pneumococcal Polysaccharide-23 10/21/2017   Pneumococcal-Unspecified 09/30/2007   Td 10/21/2017   Tdap 05/26/2007   Zoster Recombinat (Shingrix) 11/15/2018, 03/08/2020   Zoster, Live 09/30/2007    TDAP status: Up to date  Flu Vaccine status: Up to date  Pneumococcal vaccine status: Up to date  Covid-19 vaccine status: Information provided on how to obtain vaccines.   Qualifies for Shingles Vaccine? Yes   Zostavax completed Yes   Shingrix Completed?: Yes  Screening Tests Health Maintenance  Topic Date Due   Medicare Annual Wellness (AWV)  12/03/2022   COVID-19 Vaccine (8 - 2023-24 season) 04/03/2023 (Originally 08/24/2022)   INFLUENZA VACCINE  04/30/2023   DTaP/Tdap/Td (3 - Td or Tdap) 10/22/2027   Pneumonia Vaccine 53+ Years old  Completed   Zoster Vaccines- Shingrix  Completed   HPV VACCINES  Aged Out   COLONOSCOPY (Pts 45-55yrs Insurance coverage will need to be confirmed)  Discontinued    Health Maintenance  Health Maintenance Due  Topic Date Due   Medicare Annual Wellness (AWV)  12/03/2022    Colorectal cancer screening: No longer required.   Lung Cancer Screening: (Low Dose CT Chest recommended if Age 91-80 years, 30 pack-year currently smoking OR have quit w/in 15years.) does not qualify.   Additional Screening:  Hepatitis C Screening: does not qualify  Vision Screening: Recommended annual ophthalmology exams for early detection of glaucoma and other disorders of the eye. Is the patient up to date with their annual eye exam?  Yes  Who is the provider or what is the name of the office in which the patient attends annual eye exams? Dr. Posey Pronto If pt is not established with a provider, would they like to be referred to a provider to establish care? No .   Dental  Screening: Recommended annual dental exams for proper oral hygiene  Community Resource Referral / Chronic Care Management: CRR required this visit?  No   CCM required this visit?  No      Plan:     I have personally reviewed and noted the following in the patient's chart:   Medical and social history Use of alcohol, tobacco or illicit drugs  Current medications and supplements including opioid prescriptions. Patient is not currently taking opioid prescriptions. Functional ability and status Nutritional status Physical activity Advanced directives List of other physicians Hospitalizations, surgeries, and ER visits in previous 12 months Vitals Screenings to include cognitive, depression, and falls  Referrals and appointments  In addition, I have reviewed and discussed with patient certain preventive protocols, quality metrics, and best practice recommendations. A written personalized care plan for preventive services as well as general preventive health recommendations were provided to patient.     Beatris Ship, [place]   12/30/2022   Nurse Notes: None

## 2022-12-30 NOTE — Patient Instructions (Signed)
Devin [name] , Thank you for taking time to come for your Medicare Wellness Visit. I appreciate your ongoing commitment to your health goals. Please review the following plan we discussed and let me know if I can assist you in the future.     This is a list of the screening recommended for you and due dates:  Health Maintenance  Topic Date Due   COVID-19 Vaccine (8 - 2023-24 season) 04/03/2023*   Flu Shot  04/30/2023   Medicare Annual Wellness Visit  12/30/2023   DTaP/Tdap/Td vaccine (3 - Td or Tdap) 10/22/2027   Pneumonia Vaccine  Completed   Zoster (Shingles) Vaccine  Completed   HPV Vaccine  Aged Out   Colon Cancer Screening  Discontinued  *Topic was postponed. The date shown is not the original due date.    Next appointment: Follow up in one year for your annual wellness visit.   Preventive Care 85 Years and Older, Male Preventive care refers to lifestyle choices and visits with your health care provider that can promote health and wellness. What does preventive care include? A yearly physical exam. This is also called an annual well check. Dental exams once or twice a year. Routine eye exams. Ask your health care provider how often you should have your eyes checked. Personal lifestyle choices, including: Daily care of your teeth and gums. Regular physical activity. Eating a healthy diet. Avoiding tobacco and drug use. Limiting alcohol use. Practicing safe sex. Taking low doses of aspirin every day. Taking vitamin and mineral supplements as recommended by your health care provider. What happens during an annual well check? The services and screenings done by your health care provider during your annual well check will depend on your age, overall health, lifestyle risk factors, and family history of disease. Counseling  Your health care provider may ask you questions about your: Alcohol use. Tobacco use. Drug use. Emotional well-being. Home and relationship  well-being. Sexual activity. Eating habits. History of falls. Memory and ability to understand (cognition). Work and work Statistician. Screening  You may have the following tests or measurements: Height, weight, and BMI. Blood pressure. Lipid and cholesterol levels. These may be checked every 5 years, or more frequently if you are over 78 years old. Skin check. Lung cancer screening. You may have this screening every year starting at age 31 if you have a 30-pack-year history of smoking and currently smoke or have quit within the past 15 years. Fecal occult blood test (FOBT) of the stool. You may have this test every year starting at age 4. Flexible sigmoidoscopy or colonoscopy. You may have a sigmoidoscopy every 5 years or a colonoscopy every 10 years starting at age 67. Prostate cancer screening. Recommendations will vary depending on your family history and other risks. Hepatitis C blood test. Hepatitis B blood test. Sexually transmitted disease (STD) testing. Diabetes screening. This is done by checking your blood sugar (glucose) after you have not eaten for a while (fasting). You may have this done every 1-3 years. Abdominal aortic aneurysm (AAA) screening. You may need this if you are a current or former smoker. Osteoporosis. You may be screened starting at age 61 if you are at high risk. Talk with your health care provider about your test results, treatment options, and if necessary, the need for more tests. Vaccines  Your health care provider may recommend certain vaccines, such as: Influenza vaccine. This is recommended every year. Tetanus, diphtheria, and acellular pertussis (Tdap, Td) vaccine. You may need  a Td booster every 10 years. Zoster vaccine. You may need this after age 25. Pneumococcal 13-valent conjugate (PCV13) vaccine. One dose is recommended after age 44. Pneumococcal polysaccharide (PPSV23) vaccine. One dose is recommended after age 48. Talk to your health care  provider about which screenings and vaccines you need and how often you need them. This information is not intended to replace advice given to you by your health care provider. Make sure you discuss any questions you have with your health care provider. Document Released: 10/12/2015 Document Revised: 06/04/2016 Document Reviewed: 07/17/2015 Elsevier Interactive Patient Education  2017 [place] Prevention in the Home Falls can cause injuries. They can happen to people of all ages. There are many things you can do to make your home safe and to help prevent falls. What can I do on the outside of my home? Regularly fix the edges of walkways and driveways and fix any cracks. Remove anything that might make you trip as you walk through a door, such as a raised step or threshold. Trim any bushes or trees on the path to your home. Use bright outdoor lighting. Clear any walking paths of anything that might make someone trip, such as rocks or tools. Regularly check to see if handrails are loose or broken. Make sure that both sides of any steps have handrails. Any raised decks and porches should have guardrails on the edges. Have any leaves, snow, or ice cleared regularly. Use sand or salt on walking paths during winter. Clean up any spills in your garage right away. This includes oil or grease spills. What can I do in the bathroom? Use night lights. Install grab bars by the toilet and in the tub and shower. Do not use towel bars as grab bars. Use non-skid mats or decals in the tub or shower. If you need to sit down in the shower, use a plastic, non-slip stool. Keep the floor dry. Clean up any water that spills on the floor as soon as it happens. Remove soap buildup in the tub or shower regularly. Attach bath mats securely with double-sided non-slip rug tape. Do not have throw rugs and other things on the floor that can make you trip. What can I do in the bedroom? Use night lights. Make  sure that you have a light by your bed that is easy to reach. Do not use any sheets or blankets that are too big for your bed. They should not hang down onto the floor. Have a firm chair that has side arms. You can use this for support while you get dressed. Do not have throw rugs and other things on the floor that can make you trip. What can I do in the kitchen? Clean up any spills right away. Avoid walking on wet floors. Keep items that you use a lot in easy-to-reach places. If you need to reach something above you, use a strong step stool that has a grab bar. Keep electrical cords out of the way. Do not use floor polish or wax that makes floors slippery. If you must use wax, use non-skid floor wax. Do not have throw rugs and other things on the floor that can make you trip. What can I do with my stairs? Do not leave any items on the stairs. Make sure that there are handrails on both sides of the stairs and use them. Fix handrails that are broken or loose. Make sure that handrails are as long as the stairways. Check  any carpeting to make sure that it is firmly attached to the stairs. Fix any carpet that is loose or worn. Avoid having throw rugs at the top or bottom of the stairs. If you do have throw rugs, attach them to the floor with carpet tape. Make sure that you have a light switch at the top of the stairs and the bottom of the stairs. If you do not have them, ask someone to add them for you. What else can I do to help prevent falls? Wear shoes that: Do not have high heels. Have rubber bottoms. Are comfortable and fit you well. Are closed at the toe. Do not wear sandals. If you use a stepladder: Make sure that it is fully opened. Do not climb a closed stepladder. Make sure that both sides of the stepladder are locked into place. Ask someone to hold it for you, if possible. Clearly mark and make sure that you can see: Any grab bars or handrails. First and last steps. Where the  edge of each step is. Use tools that help you move around (mobility aids) if they are needed. These include: Canes. Walkers. Scooters. Crutches. Turn on the lights when you go into a dark area. Replace any light bulbs as soon as they burn out. Set up your furniture so you have a clear path. Avoid moving your furniture around. If any of your floors are uneven, fix them. If there are any pets around you, be aware of where they are. Review your medicines with your doctor. Some medicines can make you feel dizzy. This can increase your chance of falling. Ask your doctor what other things that you can do to help prevent falls. This information is not intended to replace advice given to you by your health care provider. Make sure you discuss any questions you have with your health care provider. Document Released: 07/12/2009 Document Revised: 02/21/2016 Document Reviewed: 10/20/2014 Elsevier Interactive Patient Education  2017 [employer].

## 2022-12-30 NOTE — Progress Notes (Unsigned)
Subjective:    Patient ID: Devin [name], male    DOB: 1940-12-23, 82 y.o.   MRN: [id number]  DOS:  12/30/2022 Type of visit - description: CPX  Here for CPX, since the last Office visit saw neurology and general surgery. Notes reviewed. Also was diagnosed with a skin cancer, left face.  Fortunately, at this point he is doing okay. Denies fever or chills No nausea vomiting. No abdominal pain. Appetite is okay. Occasional constipation still.  BP Readings from Last 3 Encounters:  12/30/22 (!) 144/79  12/30/22 127/73  12/08/22 122/80      Review of Systems See above   Past Medical History:  Diagnosis Date   Arthritis    Cataract    COPD (chronic obstructive pulmonary disease)    Diverticulosis    pt states he had diverticuli in his bladder also.   H/O [facility] spotted fever 01/27/2014   Hematuria    over 5 years ago   Hernia of abdominal cavity    History of rectal polyps    Hyperplastic colon polyp    Hypertension    Macular degeneration    Rectal cancer 1994   Shoulder pain, left    per pt, he can lay on the left side!   Tremor 10/21/2018   Tubular adenoma    01/05/2013    Past Surgical History:  Procedure Laterality Date   APPENDECTOMY     COLON SURGERY     rectal cancer ~ 1999   EYE SURGERY     cataract sx   HERNIA REPAIR     IR KYPHO LUMBAR INC FX REDUCE BONE BX UNI/BIL CANNULATION INC/IMAGING  06/12/2017   IR RADIOLOGIST EVAL & MGMT  08/06/2017   mastoid gland removal     left ear   PROSTATE SURGERY  05/2015   TONSILLECTOMY      Current Outpatient Medications  Medication Instructions   carbidopa-levodopa (SINEMET IR) 25-100 MG tablet 1 tablet, Oral, 3 times daily, 7am/11am/4pm   carvedilol (COREG) 6.25 mg, Oral, 2 times daily with meals   clotrimazole-betamethasone (LOTRISONE) cream Topical, 2 times daily   fluorouracil (EFUDEX) 5 % cream No dose, route, or frequency recorded.   nortriptyline (PAMELOR) 50 mg, Oral, Daily at bedtime    omeprazole (PRILOSEC) 20 mg, Oral, Daily before breakfast       Objective:   Physical Exam BP (!) 144/79 (BP Location: Right Arm, Patient Position: Sitting, Cuff Size: Small)   Pulse 74   Temp 98.4 F (36.9 C) (Oral)   Resp 16   Wt 198 lb (89.8 kg)   SpO2 97%   BMI 25.42 kg/m  General: Well developed, NAD, BMI noted Neck: No  thyromegaly  HEENT:  Normocephalic . Face symmetric, erythema areas of the left face due to local treatment for skin cancer Lungs:  CTA B Normal respiratory effort, no intercostal retractions, no accessory muscle use. Heart: RRR,  no murmur.  Abdomen:  Not distended, soft, non-tender. No rebound or rigidity.   Lower extremities: no pretibial edema bilaterally  Skin: Exposed areas without rash. Not pale. Not jaundice Neurologic:  alert & oriented X3.  Speech normal, gait and transferring: Somewhat limited, not using a cane today.Marland Kitchen  Psych: Cognition and judgment appear intact.  Cooperative with normal attention span and concentration.  Behavior appropriate. No anxious or depressed appearing.     Assessment     Assessment HTN Depression-insomnia, lost a son ~2011 GERD sx onset ~2016 MSK: --DJD- hip injection 2017  --  L2 vertebral fracture d/t a fall , kyphoplasty 05-2017 --T score 06-2017 -0.2, was rx ca and vit D.  Started  Fosamax 09-2017 --Osteoporosis: See above BPH --s/p surgery x2 2016 , Dr Hazle Nordmann H/o etoh, mild  Rectal cancer-- s/p surgery ~ 1999, no chemo-XRT FH CAD, F MI age 42 Former light smoker , No symptoms Rash, groin lotrisone prn Skin Ca: Dr Tamala Julian, [facility]   PLAN Here for CPX HTN: On carvedilol, BP slightly elevated today but typically okay. 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  Started levodopa carbidopa.  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  Diverticular disease: Saw general surgery 12/08/22, was evaluated for a diverticular stricture in the  sigmoid colon, was recommended to proceed with a colonoscopy, if it confirms the stricture he would be a candidate for a surgical resection.  Has not pursue a colonoscopy, moving 4 incline without plans to come back to the [place].  Encouraged to discuss that with his doctors over there.  Shoulder surgery, since the last visit, was unable to proceed with a shoulder surgery and canceled. Social: Returning to [place], no plans to come back to the [place],knows to reach out if I can help his care   - Td 2019. - pnm 23: 2009 and 09-2017, pnm 13- 2015; PNM 20: 2024  - s/p  zostavax 2009; shingrix x2 - COVID vaccines  >> UTD - RSV rec  -CCS: Cscope 04-2014, + polyps, cscope again 04-2017, has not pursued a f/u cscope, leaving to [place] soon -- Prostate ca screening:   used to see urology, essentially no LUTS  --Diet and exercise discussed. --Labs: none needed      Acute lower abdominal pain: Started yesterday, no fever or chills, last BM about 5 days ago but that is not unusual for him, he has a history of rectal atony (see LOV). UDip negative. DDx includes diverticulitis, distal obstruction, rectal impaction, others. Recent BMP satisfactory, will proceed with a CT abdomen and pelvis.  ER if symptoms severe. Addendum: Declined need to do CT today or going to the ER now despite my multiple requests to do so. States  he cannot drive at night.  States he will do it first thing in the morning.  Encouraged to go to the ER if symptoms increase Rectal atony: GI eval pending Difficulty swallowing, tremors, Parkinson's?    Due to tremors and difficulty swallowing was seen by neurology, they ordered a DAT scan which is pending. Barium swallow study, 11/12/2022 excerpts: "Pt demonstrates mild changes to swallowing physiology; primary problem being late initaition with liquids at times reaching the pyriform sinuses prior to hyoid burst. " "These impairments are mild and fairly consistent with pts reports of "forgetting  to swallow" and "needing to pay close attention." No diet modification is needed at this time other than suggestion to take pills whole in puree"  Surgery clearance: In need of shoulder surgery, no cardiovascular symptoms, EKG today RBBB not new. He is clear from my side (depending on CT scan results).  He already has secure a postoperative stay at [facility]. Will make surgeon aware of possible Parkinson

## 2022-12-31 ENCOUNTER — Encounter: Payer: Self-pay | Admitting: Internal Medicine

## 2022-12-31 NOTE — Assessment & Plan Note (Signed)
Here for CPX HTN: On carvedilol, BP slightly elevated today but typically okay. 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  Started levodopa carbidopa.  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 Diverticular disease: Saw general surgery 12/08/22, was evaluated for a diverticular stricture in the sigmoid colon, was recommended to proceed with a colonoscopy, if it confirms the stricture he would be a candidate for a surgical resection.  Has not pursue a colonoscopy, moving back to [place] soon without plans to return to the [place].  Encouraged to discuss that with his doctors over there. Shoulder surgery, since the last visit, was unable to proceed with a shoulder surgery  Social: Returning to [place], no plans to come back to the [place],knows to reach out if I can help his care

## 2022-12-31 NOTE — Assessment & Plan Note (Signed)
-   Td 2019. - pnm 23: 2009 and 09-2017, pnm 13- 2015; PNM 20: 2024  - s/p  zostavax 2009; shingrix x2 - COVID vaccines  >> UTD - RSV rec  -CCS: Cscope 04-2014, + polyps, cscope again 04-2017, has not pursued a f/u cscope, leaving to [place] soon -- Prostate ca screening:   used to see urology, essentially no LUTS  --Diet and exercise discussed. --Labs: none needed

## 2022-12-31 NOTE — Progress Notes (Signed)
I have reviewed and agree with [facility] documentation.  Yusef Lamp, MD  

## 2023-01-08 DIAGNOSIS — L270 Generalized skin eruption due to drugs and medicaments taken internally: Secondary | ICD-10-CM | POA: Diagnosis not present

## 2023-01-29 ENCOUNTER — Telehealth: Payer: Medicare HMO | Admitting: Neurology

## 2023-02-10 ENCOUNTER — Encounter: Payer: Self-pay | Admitting: Internal Medicine

## 2023-02-10 ENCOUNTER — Ambulatory Visit: Payer: Medicare HMO | Admitting: Internal Medicine

## 2023-03-07 ENCOUNTER — Other Ambulatory Visit: Payer: Self-pay | Admitting: Neurology

## 2023-06-14 HISTORY — PX: TOTAL SHOULDER REPLACEMENT: SUR1217

## 2023-08-05 ENCOUNTER — Telehealth: Payer: Self-pay | Admitting: Internal Medicine

## 2023-08-05 NOTE — Telephone Encounter (Signed)
Sent for scanning

## 2023-08-05 NOTE — Telephone Encounter (Signed)
Pt called wanting to let Dr. Drue Novel know that he won't be back from the [place] until after Christmas and that we can look into his apptsthat were going to made for him until then.

## 2023-08-05 NOTE — Telephone Encounter (Signed)
FYI. Do you want me to send his letter to be scanned?

## 2023-08-05 NOTE — Telephone Encounter (Signed)
Yes, please scan the letter he sent.

## 2023-09-15 ENCOUNTER — Encounter: Payer: Self-pay | Admitting: Internal Medicine

## 2023-09-15 ENCOUNTER — Ambulatory Visit (INDEPENDENT_AMBULATORY_CARE_PROVIDER_SITE_OTHER): Payer: Medicare HMO | Admitting: Internal Medicine

## 2023-09-15 VITALS — BP 136/80 | HR 68 | Temp 97.7°F | Resp 18 | Ht 74.0 in | Wt 203.2 lb

## 2023-09-15 DIAGNOSIS — Z96612 Presence of left artificial shoulder joint: Secondary | ICD-10-CM | POA: Diagnosis not present

## 2023-09-15 DIAGNOSIS — G20A1 Parkinson's disease without dyskinesia, without mention of fluctuations: Secondary | ICD-10-CM

## 2023-09-15 DIAGNOSIS — I10 Essential (primary) hypertension: Secondary | ICD-10-CM

## 2023-09-15 DIAGNOSIS — M810 Age-related osteoporosis without current pathological fracture: Secondary | ICD-10-CM

## 2023-09-15 LAB — BASIC METABOLIC PANEL
BUN: 18 mg/dL (ref 6–23)
CO2: 31 meq/L (ref 19–32)
Calcium: 9.1 mg/dL (ref 8.4–10.5)
Chloride: 104 meq/L (ref 96–112)
Creatinine, Ser: 0.79 mg/dL (ref 0.40–1.50)
GFR: 83.26 mL/min (ref >60.00)
Glucose, Bld: 100 mg/dL — ABNORMAL HIGH (ref 70–99)
Potassium: 4 meq/L (ref 3.5–5.1)
Sodium: 141 meq/L (ref 135–145)

## 2023-09-15 NOTE — Assessment & Plan Note (Signed)
HTN: BP is well-controlled, encouraged to check at home, continue carvedilol, check a BMP. Parkinson's: Good compliance with medication, request to see Dr. Arbutus Leas, referral sent. MSK: Had a left reverse shoulder replacement (in the [place]), doing well, needs PT, will refer. BPH: Nearly asymptomatic, has not seen urology in years, reports that his PSA has been checked in [place] and is okay. Osteoporosis: Took Fosamax started 2019, until ~ 2023.  On a holiday, recommend vitamin D Skin cancer: Plans to see Dr. Katrinka Blazing, if he has trouble getting an appointment he will let me know. Rectal cancer: Asymptomatic, last colonoscopy 05/20/2017, wonders if he needs to proceed, will reach out to GI. Vaccine advised: Recently had a COVID and flu shot in [place]. RTC as needed (departing back  to [place] in about 6 weeks)

## 2023-09-15 NOTE — Progress Notes (Signed)
Subjective:    Patient ID: Devin [name], male    DOB: 1941/05/18, 82 y.o.   MRN: [id number]  DOS:  09/15/2023 Type of visit - description: f/u  Here for follow-up. Feeling well. Chronic medical problems addressed.  Denies chest pain, difficulty breathing or palpitations. No nausea vomiting.  No diarrhea or blood in the stools.  Stools are normal.  No dysuria or gross hematuria.  Urinary flow is slightly slow.   Review of Systems See above   Past Medical History:  Diagnosis Date   Arthritis    Cataract    COPD (chronic obstructive pulmonary disease) (HCC)    Diverticulosis    pt states he had diverticuli in his bladder also.   H/O [facility] spotted fever 01/27/2014   Hematuria    over 5 years ago   Hernia of abdominal cavity    History of rectal polyps    Hyperplastic colon polyp    Hypertension    Macular degeneration    Rectal cancer (HCC) 1994   Shoulder pain, left    per pt, he can lay on the left side!   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 HEENT:  Normocephalic . Face symmetric, atraumatic Lungs:  CTA B Normal respiratory effort, no intercostal retractions, no accessory muscle use. Heart: RRR,  no murmur.  Lower extremities: no pretibial edema bilaterally  Skin: Not pale. Not jaundice Neurologic:  alert & oriented X3.  Speech normal, gait appropriate for age and unassisted Psych--  Cognition and judgment appear intact.  Cooperative with normal attention span and concentration.  Behavior appropriate. No anxious or depressed appearing.      Assessment     Assessment HTN Depression-insomnia, lost a son ~2011 GERD sx onset ~2016 MSK: --DJD- hip injection 2017  --L2 vertebral fracture d/t a fall , kyphoplasty 05-2017 --T score 06-2017 -0.2, was rx ca and vit D.  Started  Fosamax 09-2017 --Osteoporosis: See above BPH --s/p surgery x2 2016 , Dr Jeralyn Bennett H/o etoh, mild  Rectal cancer-- s/p surgery ~ 1999, no chemo-XRT FH CAD, F MI age 27 Former light smoker , No symptoms Rash, groin lotrisone prn Skin Ca: Dr Katrinka Blazing, [facility]   PLAN HTN: BP is well-controlled, encouraged  to check at home, continue carvedilol, check a BMP. Parkinson's: Good compliance with medication, request to see Dr. Arbutus Leas, referral sent. MSK: Had a left reverse shoulder replacement (in the [place]), doing well, needs PT, will refer. BPH: Nearly asymptomatic, has not seen urology in years, reports that his PSA has been checked in [place] and is okay. Osteoporosis: Took Fosamax started 2019, until ~ 2023.  On a holiday, recommend vitamin D Skin cancer: Plans to see Dr. Katrinka Blazing, if he has trouble getting an appointment he will let me know. Rectal cancer: Asymptomatic, last colonoscopy 05/20/2017, wonders if he needs to proceed, will reach out to GI. Vaccine advised: Recently had a COVID and flu shot in [place]. RTC as needed (departing back  to [place] in about 6 weeks)

## 2023-09-15 NOTE — Patient Instructions (Addendum)
We are referring you to Dr. Arbutus Leas  We are referring you to physical therapy  Continue the same medications  See Dr. Katrinka Blazing regarding skin cancer   Check the  blood pressure regularly Blood pressure goal:  between 110/65 and  135/85. If it is consistently higher or lower, let me know     GO TO THE LAB : Get the blood work     Next visit with me as needed, when you come back from [place] Please schedule it at the front desk

## 2023-09-16 ENCOUNTER — Telehealth: Payer: Self-pay

## 2023-09-16 DIAGNOSIS — G4700 Insomnia, unspecified: Secondary | ICD-10-CM

## 2023-09-16 MED ORDER — NORTRIPTYLINE HCL 50 MG PO CAPS: 50.0000 mg | ORAL_CAPSULE | Freq: Every day | ORAL | 1 refills | Status: AC

## 2023-09-16 MED ORDER — CARVEDILOL 6.25 MG PO TABS: 6.2500 mg | ORAL_TABLET | Freq: Two times a day (BID) | ORAL | 1 refills | Status: DC

## 2023-09-16 NOTE — Telephone Encounter (Signed)
Rx's sent. Unable to refill carbiopa-levodopa-entacapone, this will have to come from neuro.

## 2023-09-16 NOTE — Telephone Encounter (Signed)
Copied from CRM [id number]. Topic: Clinical - Medication Question >> Sep 16, 2023  9:31 AM Elizebeth Brooking wrote: Reason for CRM: Patient called in stating he went to the pharmacy for his medication, and the medication wasn't at the pharmacy that he was requesting.

## 2023-09-17 ENCOUNTER — Encounter: Payer: Self-pay | Admitting: Internal Medicine

## 2023-09-18 ENCOUNTER — Telehealth: Payer: Self-pay

## 2023-09-18 ENCOUNTER — Telehealth: Payer: Self-pay | Admitting: Neurology

## 2023-09-18 ENCOUNTER — Other Ambulatory Visit: Payer: Self-pay

## 2023-09-18 DIAGNOSIS — G20A1 Parkinson's disease without dyskinesia, without mention of fluctuations: Secondary | ICD-10-CM

## 2023-09-18 MED ORDER — CARBIDOPA-LEVODOPA-ENTACAPONE 37.5-150-200 MG PO TABS: 1.0000 | ORAL_TABLET | Freq: Three times a day (TID) | ORAL | 0 refills | Status: DC

## 2023-09-18 NOTE — Telephone Encounter (Signed)
Pt informed he will need to contact Dr. Don Perking office to see if she will assume management of his parkinsons medication (his provider in [place] where he lives the majority of the year prescribed it for him).

## 2023-09-18 NOTE — Telephone Encounter (Signed)
Patient needs a refill on his carbi/levo/entacapone 37.5-150-200  [facility] [address] in [place]  He said he is completely out. Would like a call back to let him know the refill was sent to pharmacy

## 2023-09-18 NOTE — Telephone Encounter (Signed)
Initial Comment Caller states she is calling for her husband. Dr. Drue Novel was suppose to send in his Parkinson's medication, but it was not at the pharmacy. He is completely out and she doesn't know the name of the medication. Additional Comment No current symptoms Translation No Disp. Time Lamount Cohen Time) Disposition Final User 09/17/2023 7:19:08 PM Send To Nurse Shawnee Knapp, RN, Doree Fudge 09/17/2023 10:27:32 PM FINAL ATTEMPT MADE - message left Denning-Mckibben, RN, Raynelle Fanning 09/17/2023 10:27:40 PM Send to RN Final Attempt Queue Brynda Greathouse, RN, Raynelle Fanning 09/17/2023 11:53:37 PM Attempt made - message left Torrens, RN, Lurena Joiner 09/18/2023 12:02:34 AM FINAL ATTEMPT MADE - message left Yes Tomie China, RN, Lurena Joiner Final Disposition 09/18/2023 12:02:34 AM FINAL ATTEMPT MADE - message left Yes Torrens, RN, Lurena Joiner

## 2023-09-18 NOTE — Telephone Encounter (Signed)
Pt said he is calling someone back about his medicine. You will need to call his wifes number, cause his phone is out of minutes  [phone]

## 2023-09-18 NOTE — Telephone Encounter (Signed)
Patient is traveling back and forth for one part of the year and then staying here the rest of the year. Patient scheduled for 10-05-22 to come into th office . Patient asking for meds to get to appointment only

## 2023-09-18 NOTE — Telephone Encounter (Signed)
Rx refilled by Dr. Don Perking office

## 2023-09-18 NOTE — Telephone Encounter (Signed)
RX sent

## 2023-09-18 NOTE — Telephone Encounter (Signed)
Pt called in and left a message. He stated he needs his parkinson's medication refilled. He is out. He said Dr. Drue Novel cannot do it anymore. He did not specify the name of the medication or pharmacy.

## 2023-09-18 NOTE — Telephone Encounter (Signed)
Called aptietn and RX has been sent in

## 2023-09-18 NOTE — Progress Notes (Deleted)
Assessment/Plan:   1.  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  While this does correlate with a diagnosis of Parkinson's disease, the degree of which he has lost dopamine, certainly does not correlate with patient's symptoms, which are incredibly mild.  -Patient and I discussed medication.  He would like to start on medication before he goes back to [place].  We discussed levodopa.  We will start carbidopa/levodopa 25/100 and over the next month we will work to 1 tablet at 7 AM/11 AM/4 PM.  Discussed risk, benefits, and side effects.  -He asked me to write a letter to his doctor in [place] so that he could get a referral to neurology in [place].  Apparently, without such a letter he would be treated by his general practitioner and patient would like neurology treatment.  Letter was written for him to take to [place].  2.  Dysphagia  -Patient had modified barium swallow on November 12, 2022.  They noted just mild changes and recommended regular diet with thin liquid and speech therapy for hypophonia.  3.  Sialorrhea  -This is commonly associated with PD.  We talked about treatments.  The patient is not a candidate for oral anticholinergic therapy because of increased risk of confusion and falls.  We discussed Botox (type A and B) and 1% atropine drops.  We discusssed that candy like lemon drops can help by stimulating mm of the oropharynx to induce swallowing.  4.  Adjustment d/o  -Patient currently with stressful family situation and plans to move back to [place] in April.   Subjective:   Devin [name] was seen today in follow up for parkinsonism.  I have not seen him since I started levodopa back last March.  At that point in time, he had just had a DaTscan and he wanted to start medication before he moved back to [place].  He had told me he was not coming back and he was just going to follow-up as needed.  Patient  reports today that he will be coming back and forth between here in [place] and wishes to receive medical care here.  As above, he is now on levodopa.  He is tolerating the medication well, without side effects.  He had a reverse shoulder replacement in [place] and did well with that.  Current movement disorder medications: Carbidopa/levodopa 25/100, 1 tablet 3 times per day.   ALLERGIES:   Allergies  Allergen Reactions   Escitalopram Other (See Comments)    "made me feel drunk"    CURRENT MEDICATIONS:  No outpatient medications have been marked as taking for the 09/28/23 encounter (Appointment) with Keanthony [name], Octaviano Batty, DO.     Objective:   PHYSICAL EXAMINATION:    VITALS:   There were no vitals filed for this visit.   GEN:  The patient appears stated age and is in NAD. HEENT:  Normocephalic, atraumatic.  The mucous membranes are moist. The superficial temporal arteries are without ropiness or tenderness. CV:  RRR Lungs:  CTAB Neck/HEME:  There are no carotid bruits bilaterally.  Neurological examination:  Orientation: The patient is alert and oriented x3. Cranial nerves: There is good facial symmetry with facial hypomimia. The speech is fluent and clear. Soft palate rises symmetrically and there is no tongue deviation. Hearing is intact to conversational tone. Sensation: Sensation is intact to light touch throughout Motor: Strength is at least antigravity x4.  Movement examination: Tone: There is mild increased  tone in the right upper extremity. Abnormal movements: there is rare rest tremor in the RUE Coordination:  There is min decremation with RAM's, with any form of RAMS, including alternating supination and pronation of the forearm, hand opening and closing, finger taps, heel taps and toe taps.  Gait and Station: The patient has difficulty arising out of a deep-seated chair without the use of the hands. The patient's stride length is good with good arm swing  I have  reviewed and interpreted the following labs independently    Chemistry      Component Value Date/Time   NA 141 09/15/2023 1036   NA 140 02/06/2015 0000   K 4.0 09/15/2023 1036   CL 104 09/15/2023 1036   CO2 31 09/15/2023 1036   BUN 18 09/15/2023 1036   BUN 13 02/06/2015 0000   CREATININE 0.79 09/15/2023 1036   CREATININE 0.79 11/27/2015 1024   GLU 95 02/06/2015 0000      Component Value Date/Time   CALCIUM 9.1 09/15/2023 1036   ALKPHOS 54 12/04/2022 1400   AST 18 12/04/2022 1400   ALT 16 12/04/2022 1400   BILITOT 0.4 12/04/2022 1400       Lab Results  Component Value Date   WBC 7.3 12/04/2022   HGB 14.2 12/04/2022   HCT 41.7 12/04/2022   MCV 91.0 12/04/2022   PLT 206.0 12/04/2022    Lab Results  Component Value Date   TSH 2.50 10/03/2022     Total time spent on today's visit was 30 minutes, including both face-to-face time and nonface-to-face time.  Time included that spent on review of records (prior notes available to me/labs/imaging if pertinent), discussing treatment and goals, answering patient's questions and coordinating care.  Cc:  Wanda Plump, MD

## 2023-09-18 NOTE — Telephone Encounter (Signed)
Source  Devin [name] (Patient)   Subject  Devin [name] (Patient)   Topic  Clinical - Prescription Issue    Communication  Reason for CRM: Pt states ran out of parkinson med, does not remember the name. Went to the pharmacy twice, pls rf and c/b [phone].    [id number] - [place], [place] - [address] AT [facility] [address]    [address] [place] [address]    Phone: [phone] Fax: [phone]

## 2023-09-21 ENCOUNTER — Other Ambulatory Visit: Payer: Self-pay

## 2023-09-21 ENCOUNTER — Telehealth: Payer: Self-pay | Admitting: Neurology

## 2023-09-21 DIAGNOSIS — G20A1 Parkinson's disease without dyskinesia, without mention of fluctuations: Secondary | ICD-10-CM

## 2023-09-21 MED ORDER — CARBIDOPA-LEVODOPA-ENTACAPONE 37.5-150-200 MG PO TABS: 1.0000 | ORAL_TABLET | Freq: Three times a day (TID) | ORAL | 0 refills | Status: DC

## 2023-09-21 NOTE — Telephone Encounter (Signed)
I had already called pharmacy and had medication moved

## 2023-09-21 NOTE — Telephone Encounter (Signed)
Pt called in stating he has now been with out his medication for about a week now and feels like he is doing better than ever. He is going to hold off on taking it until he comes in for his next visit on 09/28/23

## 2023-09-21 NOTE — Telephone Encounter (Signed)
Pt's wife called in and left a message with after hours service on 09/18/23. Caller states he is at the [facility] in [place] to pick up his Parkinson's medication. He is needing the one with an additional medication in it. They have it at another pharmacy and needs it called to [facility] in [place] on [address] 24 hours. He is out of his medication for 2 days. Showing symptoms now.  Full access nurse report is in Dr. Don Perking box

## 2023-09-24 ENCOUNTER — Other Ambulatory Visit: Payer: Self-pay | Admitting: Internal Medicine

## 2023-09-25 ENCOUNTER — Other Ambulatory Visit: Payer: Self-pay | Admitting: Family

## 2023-09-25 ENCOUNTER — Other Ambulatory Visit: Payer: Self-pay | Admitting: Internal Medicine

## 2023-09-25 DIAGNOSIS — G4700 Insomnia, unspecified: Secondary | ICD-10-CM

## 2023-09-28 ENCOUNTER — Ambulatory Visit: Payer: Medicare HMO | Admitting: Neurology

## 2023-09-29 DIAGNOSIS — D0439 Carcinoma in situ of skin of other parts of face: Secondary | ICD-10-CM | POA: Diagnosis not present

## 2023-10-01 NOTE — Progress Notes (Deleted)
 Assessment/Plan:   1.  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  While this does correlate with a diagnosis of Parkinson's disease, the degree of which he has lost dopamine, certainly does not correlate with patient's symptoms, which are incredibly mild.  -***  2.  Dysphagia  -Patient had modified barium swallow on November 12, 2022.  They noted just mild changes and recommended regular diet with thin liquid and speech therapy for hypophonia.  3.  Sialorrhea  -This is commonly associated with PD.  We talked about treatments.  The patient is not a candidate for oral anticholinergic therapy because of increased risk of confusion and falls.  We discussed Botox (type A and B) and 1% atropine drops.  We discusssed that candy like lemon drops can help by stimulating mm of the oropharynx to induce swallowing.  4.  Adjustment d/o  -Patient currently with stressful family situation and plans to move back to [place] in April.   Subjective:   Devin [name] was seen today in follow up for parkinsonism.  I have not seen him since I started levodopa  back last March.  At that point in time, he had just had a DaTscan  and he wanted to start medication before he moved back to [place].  He had told me he was not coming back and he was just going to follow-up as needed.  Patient reports today that he will be coming back and forth between here in [place] and wishes to receive medical care here.  As above, he is now on levodopa .  He is tolerating the medication well, without side effects.  He had a reverse shoulder replacement in [place] and did well with that.  Current movement disorder medications: Carbidopa /levodopa  25/100, 1 tablet 3 times per day.   ALLERGIES:   Allergies  Allergen Reactions   Escitalopram Other (See Comments)    made me feel drunk    CURRENT MEDICATIONS:  No outpatient medications have been marked as taking  for the 10/05/23 encounter (Appointment) with Alwyn Cordner, Asberry RAMAN, DO.     Objective:   PHYSICAL EXAMINATION:    VITALS:   There were no vitals filed for this visit.   GEN:  The patient appears stated age and is in NAD. HEENT:  Normocephalic, atraumatic.  The mucous membranes are moist. The superficial temporal arteries are without ropiness or tenderness. CV:  RRR Lungs:  CTAB Neck/HEME:  There are no carotid bruits bilaterally.  Neurological examination:  Orientation: The patient is alert and oriented x3. Cranial nerves: There is good facial symmetry with facial hypomimia. The speech is fluent and clear. Soft palate rises symmetrically and there is no tongue deviation. Hearing is intact to conversational tone. Sensation: Sensation is intact to light touch throughout Motor: Strength is at least antigravity x4.  Movement examination: Tone: There is mild increased tone in the right upper extremity. Abnormal movements: there is rare rest tremor in the RUE Coordination:  There is min decremation with RAM's, with any form of RAMS, including alternating supination and pronation of the forearm, hand opening and closing, finger taps, heel taps and toe taps.  Gait and Station: The patient has difficulty arising out of a deep-seated chair without the use of the hands. The patient's stride length is good with good arm swing  I have reviewed and interpreted the following labs independently    Chemistry      Component Value Date/Time   NA 141 09/15/2023  1036   NA 140 02/06/2015 0000   K 4.0 09/15/2023 1036   CL 104 09/15/2023 1036   CO2 31 09/15/2023 1036   BUN 18 09/15/2023 1036   BUN 13 02/06/2015 0000   CREATININE 0.79 09/15/2023 1036   CREATININE 0.79 11/27/2015 1024   GLU 95 02/06/2015 0000      Component Value Date/Time   CALCIUM  9.1 09/15/2023 1036   ALKPHOS 54 12/04/2022 1400   AST 18 12/04/2022 1400   ALT 16 12/04/2022 1400   BILITOT 0.4 12/04/2022 1400       Lab Results   Component Value Date   WBC 7.3 12/04/2022   HGB 14.2 12/04/2022   HCT 41.7 12/04/2022   MCV 91.0 12/04/2022   PLT 206.0 12/04/2022    Lab Results  Component Value Date   TSH 2.50 10/03/2022     Total time spent on today's visit was *** minutes, including both face-to-face time and nonface-to-face time.  Time included that spent on review of records (prior notes available to me/labs/imaging if pertinent), discussing treatment and goals, answering patient's questions and coordinating care.  Cc:  Amon Aloysius BRAVO, MD

## 2023-10-05 ENCOUNTER — Ambulatory Visit: Payer: Medicare HMO | Admitting: Neurology

## 2023-10-05 ENCOUNTER — Telehealth: Payer: Self-pay | Admitting: Neurology

## 2023-10-05 NOTE — Telephone Encounter (Signed)
 Patient had appt with Dr Evonnie on 10-05-23 at 8:15 am, our office did not open until 10:00 due to weather.  On 10-03-22 at 7:48 pm I called and sent out text message to let patient know that we would be opening at 10:00 and we would call him on 10-05-23 to resch his appt.  Patient showed up at the office on 10-05-23 at his schedule appt time I informed patient that we were going to have to resch and due to our office not opening until 10:00. He said that no one called to let him know about the office not opening until 10. I informed him that I personally called and sent a text message to him on 10-04-23 to let him know. He states again  that no one called and he does not read text messages.  He was in my personal space demanding that he see Dr Tat and waving his arms. He was very upset and yelling that he wanted to see Dr Asberry Tat and when was she going to be here. I again informed him that she was not here and we were opening at 10:00. I asked him to come around to my desk so I could verify the phone number as he asked which phone number I called. He was still very upset and yelling I told him that I would not be spoken to that way and to stand  at my desk and I would verify the phone number. He said that he would stand there  as he hit the counter with his fist and I verified that we had the correct phone number with patient. I got him reschedule to 10-12-23 and told him that I would call him once I spoke with Dr Tat and see where we could get him at. Before I could speak with Dr Tat she had a Cancellation for [phone] I called patient and him moved to that appt. The patient did tell me he was sorry for the way he acted.

## 2023-10-05 NOTE — Progress Notes (Signed)
 Assessment/Plan:   1.  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  While this does correlate with a diagnosis of Parkinson's disease, the degree of which he has lost dopamine, certainly does not correlate with patient's symptoms, which are incredibly mild.  -discussed walker   -didn't do well with stalevo  (given by physician in [place]).  Likely due to entacapone  component.  Discussed with patient today.  Would recommend restart carbidopa /levodopa  25/100, 1 po  tid at 7am/11am/4pm  -Patient wanted to travel to [place] by himself.  His ex-wife was very against it.  I do not disagree.  I think he should have a travel companion.  -discussed stationary biking 150 min a week  2.  Dysphagia  -Patient had modified barium swallow on November 12, 2022.  They noted just mild changes and recommended regular diet with thin liquid and speech therapy for hypophonia.  -discussed Parkinsons Disease speech therapy today but they don't think available in [place], where he is moving  3.  Sialorrhea  -This is commonly associated with PD.  We talked about treatments.  The patient is not a candidate for oral anticholinergic therapy because of increased risk of confusion and falls.  We discussed Botox (type A and B) and 1% atropine drops.  We discusssed that candy like lemon drops can help by stimulating mm of the oropharynx to induce swallowing.  4.  Pt and I discussed that he will need to find a new neurologist because of inappropriate behavior to staff yesterday.  Patient and I discussed this today.  I also discussed with his primary care physician and he will find the patient a new neurologist.  I have refilled his medication x 6 months.  Subjective:   Devin [name] was seen today in follow up for parkinsonism.  He brings his ex wife with him, who supplements the hx.  I have not seen him since I started levodopa  back last March.  At that  point in time, he had just had a DaTscan  and he wanted to start medication before he moved back to [place].  He had told me he was not coming back and he was just going to follow-up as needed.   He returns today on stalevo  150mg , RX in [place]. He states that he ran out of that 12/10 but when he ran out he did better and he was in a zombie state and my personality was better.  He had increased anxiety.  His ex wife/friend states that he related better and talked without it and although we RX the carbidopa /levodopa  25/100 to the pharmacy, he has been off all medication.   Pt is and has been exercising well.  He had a reverse shoulder replacement in [place] and did well with that.  Has had a few falls - it doesn't take much - one a dog brushed by and he fell.  Ex-wife/friend states that he can fall fairly easily.  She worries about him as he wants to travel to [place] by himself.  Separately, he mentions soft speech.  Current movement disorder medications: Carbidopa /levodopa  25/100, 1 tablet 3 times per day.   ALLERGIES:   Allergies  Allergen Reactions   Escitalopram Other (See Comments)    made me feel drunk    CURRENT MEDICATIONS:  Current Meds  Medication Sig   carbidopa -levodopa -entacapone  (STALEVO ) 37.5-150-200 MG tablet Take 1 tablet by mouth 3 (three) times daily.   carvedilol  (COREG ) 6.25 MG tablet TAKE  1 TABLET TWICE DAILY WITH A MEAL   clotrimazole -betamethasone  (LOTRISONE ) cream APPLY TOPICALLY 2 (TWO) TIMES DAILY.   nortriptyline  (PAMELOR ) 50 MG capsule Take 1 capsule (50 mg total) by mouth at bedtime.     Objective:   PHYSICAL EXAMINATION:    VITALS:   Vitals:   10/06/23 0919  BP: 132/84  Pulse: 68  SpO2: 98%  Weight: 202 lb 6.4 oz (91.8 kg)  Height: 6' (1.829 m)     GEN:  The patient appears stated age and is in NAD. HEENT:  Normocephalic, atraumatic.  The mucous membranes are moist. The superficial temporal arteries are without ropiness or tenderness. CV:   RRR Lungs:  CTAB Neck/HEME:  There are no carotid bruits bilaterally.  Neurological examination:  Orientation: The patient is alert and oriented x3. Cranial nerves: There is good facial symmetry with facial hypomimia. The speech is fluent and clear.  He is somewhat hypophonic.  Soft palate rises symmetrically and there is no tongue deviation. Hearing is intact to conversational tone. Sensation: Sensation is intact to light touch throughout Motor: Strength is at least antigravity x4.  Movement examination: Tone: There is minimal increased tone in the right upper extremity. Abnormal movements: there is rare rest tremor in the RUE Coordination:  There is mild decremation with finger and toe taps bilaterally Gait and Station: The patient has difficulty arising out of a deep-seated chair without the use of the hands. The patient's stride length is decreased with trouble in the turns - turns en bloc.  His knees are flexed.  He is somewhat unbalanced.  I have reviewed and interpreted the following labs independently    Chemistry      Component Value Date/Time   NA 141 09/15/2023 1036   NA 140 02/06/2015 0000   K 4.0 09/15/2023 1036   CL 104 09/15/2023 1036   CO2 31 09/15/2023 1036   BUN 18 09/15/2023 1036   BUN 13 02/06/2015 0000   CREATININE 0.79 09/15/2023 1036   CREATININE 0.79 11/27/2015 1024   GLU 95 02/06/2015 0000      Component Value Date/Time   CALCIUM  9.1 09/15/2023 1036   ALKPHOS 54 12/04/2022 1400   AST 18 12/04/2022 1400   ALT 16 12/04/2022 1400   BILITOT 0.4 12/04/2022 1400       Lab Results  Component Value Date   WBC 7.3 12/04/2022   HGB 14.2 12/04/2022   HCT 41.7 12/04/2022   MCV 91.0 12/04/2022   PLT 206.0 12/04/2022    Lab Results  Component Value Date   TSH 2.50 10/03/2022     Total time spent on today's visit was 30 minutes, including both face-to-face time and nonface-to-face time.  Time included that spent on review of records (prior notes  available to me/labs/imaging if pertinent), discussing treatment and goals, answering patient's questions and coordinating care.  Cc:  Amon Aloysius BRAVO, MD

## 2023-10-06 ENCOUNTER — Ambulatory Visit: Payer: Medicare HMO | Admitting: Neurology

## 2023-10-06 ENCOUNTER — Encounter: Payer: Self-pay | Admitting: Neurology

## 2023-10-06 VITALS — BP 132/84 | HR 68 | Ht 72.0 in | Wt 202.4 lb

## 2023-10-06 DIAGNOSIS — G20A1 Parkinson's disease without dyskinesia, without mention of fluctuations: Secondary | ICD-10-CM

## 2023-10-06 MED ORDER — CARBIDOPA-LEVODOPA 25-100 MG PO TABS: 1.0000 | ORAL_TABLET | Freq: Three times a day (TID) | ORAL | 1 refills | Status: DC

## 2023-10-08 ENCOUNTER — Telehealth: Payer: Self-pay | Admitting: Neurology

## 2023-10-08 NOTE — Telephone Encounter (Signed)
 Patient dismissed from [facility]. All providers practicing at this clinic will no longer be able to provide medical care to you. We believe that our patient / provider relationship has been compromised and thus would request you seek care elsewhere.   10/06/23

## 2023-10-12 ENCOUNTER — Ambulatory Visit: Payer: Medicare HMO | Admitting: Neurology

## 2023-10-19 ENCOUNTER — Encounter: Payer: Self-pay | Admitting: Internal Medicine

## 2023-10-19 ENCOUNTER — Telehealth: Payer: Self-pay | Admitting: Internal Medicine

## 2023-10-19 DIAGNOSIS — G20A1 Parkinson's disease without dyskinesia, without mention of fluctuations: Secondary | ICD-10-CM

## 2023-10-19 NOTE — Telephone Encounter (Signed)
Copied from CRM [id number]. Topic: Clinical - Medication Refill >> Oct 19, 2023  9:06 AM Louie Boston wrote: Most Recent Primary Care Visit:  Provider: Wanda Plump  Department: [facility]  Visit Type: OFFICE VISIT  Date: 09/15/2023  Medication: clotrimazole-betamethasone (LOTRISONE) cream   Has the patient contacted their pharmacy? Yes (Agent: If no, request that the patient contact the pharmacy for the refill. If patient does not wish to contact the pharmacy document the reason why and proceed with request.) (Agent: If yes, when and what did the pharmacy advise?)  Patient was advised that he needed to contact provider to request new prescription. Patient has about 2 days left of medication.   Is this the correct pharmacy for this prescription? Yes If no, delete pharmacy and type the correct one.  This is the patient's preferred pharmacy:   [facility] - [place], [place] - [address] [place] [address] Phone: [phone] Fax: [phone]    Has the prescription been filled recently? No  Is the patient out of the medication? No  Has the patient been seen for an appointment in the last year OR does the patient have an upcoming appointment? Yes  Can we respond through MyChart? Yes  Agent: Please be advised that Rx refills may take up to 3 business days. We ask that you follow-up with your pharmacy.

## 2023-10-19 NOTE — Telephone Encounter (Unsigned)
Copied from CRM [id number]. Topic: Clinical - Medication Refill >> Oct 19, 2023  9:06 AM Louie Boston wrote: Most Recent Primary Care Visit:  Provider: Wanda Plump  Department: [facility]  Visit Type: OFFICE VISIT  Date: 09/15/2023  Medication: clotrimazole-betamethasone (LOTRISONE) cream   Has the patient contacted their pharmacy? Yes (Agent: If no, request that the patient contact the pharmacy for the refill. If patient does not wish to contact the pharmacy document the reason why and proceed with request.) (Agent: If yes, when and what did the pharmacy advise?)  Patient was advised that he needed to contact provider to request new prescription. Patient has about 2 days left of medication.   Is this the correct pharmacy for this prescription? Yes If no, delete pharmacy and type the correct one.  This is the patient's preferred pharmacy:   [facility] - [place], [place] - [address] [place] [address] Phone: [phone] Fax: [phone]    Has the prescription been filled recently? No  Is the patient out of the medication? No  Has the patient been seen for an appointment in the last year OR does the patient have an upcoming appointment? Yes  Can we respond through MyChart? Yes  Agent: Please be advised that Rx refills may take up to 3 business days. We ask that you follow-up with your pharmacy.

## 2023-10-20 MED ORDER — CARVEDILOL 6.25 MG PO TABS: 6.2500 mg | ORAL_TABLET | Freq: Two times a day (BID) | ORAL | 1 refills | Status: AC

## 2023-10-20 MED ORDER — CLOTRIMAZOLE-BETAMETHASONE 1-0.05 % EX CREA: TOPICAL_CREAM | Freq: Two times a day (BID) | CUTANEOUS | 0 refills | Status: AC

## 2023-10-20 NOTE — Addendum Note (Signed)
Addended byConrad Salisbury D on: 10/20/2023 09:07 AM   Modules accepted: Orders

## 2023-10-20 NOTE — Telephone Encounter (Signed)
Referrals placed. Rx's sent.

## 2023-10-20 NOTE — Telephone Encounter (Signed)
Enter a referral to speech therapy, Dx Parkinson Okay to refill carvedilol x 6 months and Lotrisone 1 and 2 refills s Enter a referral to neurology.  I am providing the patient with a number to call them and set up his appointment.

## 2023-10-23 ENCOUNTER — Ambulatory Visit: Payer: Medicare HMO | Attending: Internal Medicine | Admitting: Speech Pathology

## 2023-10-23 ENCOUNTER — Encounter: Payer: Self-pay | Admitting: Speech Pathology

## 2023-10-23 DIAGNOSIS — R1312 Dysphagia, oropharyngeal phase: Secondary | ICD-10-CM | POA: Insufficient documentation

## 2023-10-23 DIAGNOSIS — R471 Dysarthria and anarthria: Secondary | ICD-10-CM | POA: Insufficient documentation

## 2023-10-23 DIAGNOSIS — G20A1 Parkinson's disease without dyskinesia, without mention of fluctuations: Secondary | ICD-10-CM | POA: Diagnosis not present

## 2023-10-23 NOTE — Addendum Note (Signed)
Addended by: Ann Lions on: 10/23/2023 11:57 AM   Modules accepted: Orders

## 2023-10-23 NOTE — Therapy (Signed)
OUTPATIENT SPEECH LANGUAGE PATHOLOGY LSVT LOUD EVALUATION   Patient Name: Devin [name] MRN: [id number] DOB:[birth year], 83 y.o., male Today's Date: 10/23/2023  PCP: Wanda Plump MD REFERRING PROVIDER: Wanda Plump MD  END OF SESSION:  End of Session - 10/23/23 0857     Visit Number 1    Number of Visits 6    Date for SLP Re-Evaluation 11/23/23    SLP Start Time 0853    SLP Stop Time  0930    SLP Time Calculation (min) 37 min    Activity Tolerance Patient tolerated treatment well             Past Medical History:  Diagnosis Date   Arthritis    Cataract    COPD (chronic obstructive pulmonary disease) (HCC)    Diverticulosis    pt states he had diverticuli in his bladder also.   H/O [facility] spotted fever 01/27/2014   Hematuria    over 5 years ago   Hernia of abdominal cavity    History of rectal polyps    Hyperplastic colon polyp    Hypertension    Macular degeneration    Rectal cancer (HCC) 1994   Shoulder pain, left    per pt, he can lay on the left side!   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/2023   Patient Active Problem List   Diagnosis Date Noted   Diverticulitis 12/03/2022   Tremor 10/21/2018   Erectile dysfunction 03/08/2018   Greater trochanteric bursitis of both hips 12/11/2017   Back pain    Compression fracture of L2 (HCC) 06/07/2017   Insomnia 10/14/2016   DJD (degenerative joint disease) 10/14/2016   PCP NOTES >>>>>>>>>>>>>>>>>>>>>>>>> 11/28/2015   Benign prostatic hyperplasia with urinary obstruction 05/30/2015    Annual physical exam 11/27/2014   Essential hypertension 10/02/2014   Alcohol use disorder, mild, abuse 10/02/2014   Bruit 08/09/2014   GERD (gastroesophageal reflux disease) 05/25/2014   History of rectal cancer 05/15/2014   History of colonic polyps 05/15/2014   Diverticulosis of colon 05/15/2014   Family history of malignant neoplasm of gastrointestinal tract 05/15/2014   Anxiety and depression 04/19/2014    Onset date: Referred on 10/20/23  REFERRING DIAG: G20.A1 (ICD-10-CM) - Parkinson's disease, unspecified whether dyskinesia present, unspecified whether manifestations fluctuate (HCC)  THERAPY DIAG:  Dysarthria and anarthria  Rationale for Evaluation and Treatment: Rehabilitation  SUBJECTIVE:   SUBJECTIVE STATEMENT: Pt was pleasant and cooperative throughout  Pt accompanied by: self  PERTINENT HISTORY: PD  PAIN:  Are you having pain? No  FALLS: Has patient fallen in last 6 months? No, Number of falls:   LIVING ENVIRONMENT: Lives with:  ex-wife  Lives in: House/apartment  PLOF: Independent  PATIENT GOALS:   OBJECTIVE:  Note: Objective measures were completed at Evaluation unless otherwise noted.  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  Water intake: optimal; 4L/day Caffeine/alcohol intake: moderate; 4 (coffee/tea), 0-1 drinks per evening (drinks more in [place]) Daily voice use: minimal  PERCEPTUAL VOICE ASSESSMENT: Voice quality:  - ; describes voice as "weak" "I am scared to have a conversation"  Vocal abuse:  NA Resonance: normal Respiratory function: thoracic breathing; lifting shoulders   LSVT-LOUD VOICE EVALUATION: Maximum phonation time for sustained "ah": 20 Mean intensity during  sustained "ah": 71 dB  Mean fundamental frequency during sustained "ah": 97 Hz  145 Hz +/- 67for age and gender) Mean intensity sustained during conversational speech: 75 dB Habitual pitch: 100 Hz Average time patient was able to sustain /s/: 15 seconds Average time patient was able to sustain /z/: 22 seconds s/z ratio : .6  Speech is characterized by Fast rate, Monoloudness, and Hypophonia. For trained listener in quiet environment with context, intelligibility was 90% .   Patient able to improve all parameters with model ("Loud like me") Stimulability: Improved vocal quality with loud voice (89 dB) for sustained vowel, maximum high and low phonation improved to range 11.   The Communication Effectiveness Survey is a patient-reported outcome measure in which the patient rates their own effectiveness in different communication situations. A higher score indicates greater effectiveness. Pt's self-rating was -/32. Patient reported -. *NOT COMPLETED THIS DATE DUE TO TIME CONSTRAINTS  **SWALLOWING WAS NOT ASSESSED THIS DATE DUE TO TIME CONSTRAINTS. TO FURTHER ASSESS NEXT SESSION TO PROVIDE APPROPRIATE RECOMMENDATIONS AT NEXT LEVEL OF CARE.                                                                                                                           TREATMENT DATE:    PATIENT EDUCATION: Education details: PD and SLP Role Person educated: Patient Education method: Explanation Education comprehension: verbalized understanding and needs further education  HOME EXERCISE PROGRAM:   GOALS: Goals reviewed with patient? Yes    LONG TERM GOALS: Target date: 11/23/23  Pt will demonstrate knowledge of common impairments associated with PD to feel comfortable monitoring sx for doctor report.  Baseline:  Goal status: INITIAL  2.  Pt will demonstrate basic knowledge of voice exercises for continuation once he returns to [place] Baseline:  Goal status: INITIAL  3.  Pt will recall  safe swallow strategies independently  Baseline:  Goal status: INITIAL   ASSESSMENT:  CLINICAL IMPRESSION:  Pt is a 83yo male who presents to ST OP for evaluation with complaints of voice change and swallowing impairment.  Patient was late to evaluation; therefore, evaluation was limited. Pt endorses a lower vocal quality and getting strangled with liquids occasionally.  Patient reports he knows little about symptoms/impairments associated with Parkinson's disease.  SLP provided him with website for further education ([email or web address]).  SLP also assisted patient with managing accessibility feature on his iPad  to increase font size due to reported macular degeneration. Pt was assessed using Voice Analyst software on iPhone. See above for results.  Patient reported he would have been considered a "loud talker" at baseline, so his decreased vocal intensity is bothersome. He reports feeling "scared" and "embarrassed" when speaking to people who he doesn't know.  Patient declined any difficulty with this thinking skills saying he is " sharp as a tack".  Per chart review he is having some confusion, which SLP mentioned. Which then, he agreed he was having some difficulty.  Declined a SLUMs assessment at this time, as he would like to focus on voice. SLP rec skilled ST services to address hypokinetic dysarthria associated with Parkinson's as well as safe swallow strategies for dysphagia.  Patient reports he is returning to [place] in 1 month.  SLP encouraged he follow-up with a speech therapist there.       OBJECTIVE IMPAIRMENTS: include dysarthria and dysphagia. These impairments are limiting patient from effectively communicating at home and in community and safety when swallowing. Factors affecting potential to achieve goals and functional outcome are  : pt is to leave to return to [place] in 1 month .Marland Kitchen Patient will benefit from skilled SLP services to address above impairments and improve overall  function.  REHAB POTENTIAL: Fair due to limited time spent with pt before he returns to [place].  PLAN:  SLP FREQUENCY: 1-2x/week  SLP DURATION: 4 weeks  PLANNED INTERVENTIONS: Aspiration precaution training, SLP instruction and feedback, Compensatory strategies, Patient/family education, [phone] Treatment of speech (30 or 45 min) , and [id number] Treatment of swallowing function    [employer], CCC-SLP 10/23/2023, 11:41 AM

## 2023-10-26 ENCOUNTER — Ambulatory Visit: Payer: Medicare HMO | Admitting: Speech Pathology

## 2023-10-27 ENCOUNTER — Other Ambulatory Visit: Payer: Self-pay | Admitting: Neurology

## 2023-10-27 ENCOUNTER — Ambulatory Visit: Payer: Medicare HMO | Admitting: Speech Pathology

## 2023-10-28 ENCOUNTER — Ambulatory Visit: Payer: Medicare HMO | Admitting: Physical Therapy

## 2023-10-29 ENCOUNTER — Encounter: Payer: Self-pay | Admitting: Dermatology

## 2023-10-29 ENCOUNTER — Ambulatory Visit: Payer: Medicare HMO | Admitting: Dermatology

## 2023-10-29 DIAGNOSIS — L570 Actinic keratosis: Secondary | ICD-10-CM | POA: Diagnosis not present

## 2023-10-29 DIAGNOSIS — D492 Neoplasm of unspecified behavior of bone, soft tissue, and skin: Secondary | ICD-10-CM | POA: Diagnosis not present

## 2023-10-29 DIAGNOSIS — L219 Seborrheic dermatitis, unspecified: Secondary | ICD-10-CM | POA: Diagnosis not present

## 2023-10-29 DIAGNOSIS — L905 Scar conditions and fibrosis of skin: Secondary | ICD-10-CM

## 2023-10-29 MED ORDER — KETOCONAZOLE 2 % EX CREA: 1.0000 | TOPICAL_CREAM | Freq: Two times a day (BID) | CUTANEOUS | 2 refills | Status: AC

## 2023-10-29 MED ORDER — TRIAMCINOLONE ACETONIDE 0.025 % EX CREA: 1.0000 | TOPICAL_CREAM | Freq: Two times a day (BID) | CUTANEOUS | 2 refills | Status: AC

## 2023-10-29 NOTE — Patient Instructions (Signed)
Important Information   Due to recent changes in healthcare laws, you may see results of your pathology and/or laboratory studies on MyChart before the doctors have had a chance to review them. We understand that in some cases there may be results that are confusing or concerning to you. Please understand that not all results are received at the same time and often the doctors may need to interpret multiple results in order to provide you with the best plan of care or course of treatment. Therefore, we ask that you please give [place] 2 business days to thoroughly review all your results before contacting the office for clarification. Should we see a critical lab result, you will be contacted sooner.     If You Need Anything After Your Visit   If you have any questions or concerns for your doctor, please call our main line at [phone]. If no one answers, please leave a voicemail as directed and we will return your call as soon as possible. Messages left after 4 pm will be answered the following business day.    You may also send [place] a message via MyChart. We typically respond to MyChart messages within 1-2 business days.  For prescription refills, please ask your pharmacy to contact our office. Our fax number is [phone].  If you have an urgent issue when the clinic is closed that cannot wait until the next business day, you can page your doctor at the number below.     Please note that while we do our best to be available for urgent issues outside of office hours, we are not available 24/7.    If you have an urgent issue and are unable to reach [place], you may choose to seek medical care at your doctor's office, retail clinic, urgent care center, or emergency room.   If you have a medical emergency, please immediately call 911 or go to the emergency department. In the event of inclement weather, please call our main line at [phone] for an update on the status of any delays or  closures.  Dermatology Medication Tips: Please keep the boxes that topical medications come in in order to help keep track of the instructions about where and how to use these. Pharmacies typically print the medication instructions only on the boxes and not directly on the medication tubes.   If your medication is too expensive, please contact our office at [phone] or send [place] a message through MyChart.    We are unable to tell what your co-pay for medications will be in advance as this is different depending on your insurance coverage. However, we may be able to find a substitute medication at lower cost or fill out paperwork to get insurance to cover a needed medication.    If a prior authorization is required to get your medication covered by your insurance company, please allow [place] 1-2 business days to complete this process.   Drug prices often vary depending on where the prescription is filled and some pharmacies may offer cheaper prices.   The website www.goodrx.com contains coupons for medications through different pharmacies. The prices here do not account for what the cost may be with help from insurance (it may be cheaper with your insurance), but the website can give you the price if you did not use any insurance.  - You can print the associated coupon and take it with your prescription to the pharmacy.  - You may also stop by our office during regular  business hours and pick up a GoodRx coupon card.  - If you need your prescription sent electronically to a different pharmacy, notify our office through [facility] or by phone at [phone]

## 2023-10-29 NOTE — Progress Notes (Signed)
   New Patient Visit   Subjective  Devin [name] is a 83 y.o. male who presents for the following: spot on nose Pt was dx with skin cancer left nasal rim by outside practice and would like to have it treated with [place].  He also has several other spots of concern on the nose, itchy gritty lesions, not previously treated.   Patient lives 6 months of the year in the [place] and is returning on 11/14/2023 to the [place].  The following portions of the chart were reviewed this encounter and updated as appropriate: medications, allergies, medical history  Review of Systems:  No other skin or systemic complaints except as noted in HPI or Assessment and Plan.  Objective  Well appearing patient in no apparent distress; mood and affect are within normal limits.  A focused examination was performed of the following areas: Nose face  Relevant exam findings are noted in the Assessment and Plan.  Right Tip of Nose, left nasal sidewall, bilateral cheeks (5) Erythematous thin papules/macules with gritty scale.   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 Condition is bothersome/symptomatic for patient. Currently flared.   Seborrheic Dermatitis is a chronic persistent rash characterized by pinkness and scaling most commonly of the mid face but also can occur on the scalp (dandruff), ears; mid chest, mid back and groin.  It tends to be exacerbated by stress and cooler weather.  People who have neurologic disease may experience new onset or exacerbation of existing seborrheic dermatitis.  The condition is not curable but treatable and can be controlled.  Treatment Plan: Triamcinolone cream bid for 2 weeks Ketoconazole cream bid to affected area    AK (ACTINIC KERATOSIS) (5) Right Tip of Nose, left nasal sidewall, bilateral cheeks (5) Destruction of lesion - Right Tip  of Nose, left nasal sidewall, bilateral cheeks (5) Complexity: simple   Destruction method: cryotherapy   Informed consent: discussed and consent obtained   Timeout:  patient name, date of birth, surgical site, and procedure verified Lesion destroyed using liquid nitrogen: Yes   Outcome: patient tolerated procedure well with no complications   Post-procedure details: wound care instructions given   NEOPLASM OF SKIN   SCAR   SEBORRHEIC DERMATITIS   Related Medications ketoconazole (NIZORAL) 2 % cream Apply 1 Application topically 2 (two) times daily. triamcinolone (KENALOG) 0.025 % cream Apply 1 Application topically 2 (two) times daily.  Neoplasm of Skin, w/I scar- left nasal ala - Biopsied at outside practice - Patient states that lesion was cancerous and needs further treatment - We will investigate with getting medical record release form filled ou  Return for based on treatment timeline for NUS left ala.  I, Tillie Fantasia, CMA, am acting as scribe for Gwenith Daily, MD.   Documentation: I have reviewed the above documentation for accuracy and completeness, and I agree with the above.  Gwenith Daily, MD

## 2023-10-30 ENCOUNTER — Encounter: Payer: Medicare HMO | Admitting: Internal Medicine

## 2023-11-02 ENCOUNTER — Ambulatory Visit: Payer: Medicare HMO | Admitting: Physical Therapy

## 2023-11-03 ENCOUNTER — Encounter: Payer: Self-pay | Admitting: Dermatology

## 2023-11-04 ENCOUNTER — Ambulatory Visit (INDEPENDENT_AMBULATORY_CARE_PROVIDER_SITE_OTHER): Payer: Medicare HMO | Admitting: Dermatology

## 2023-11-04 ENCOUNTER — Encounter: Payer: Self-pay | Admitting: Dermatology

## 2023-11-04 VITALS — BP 123/62 | HR 68 | Temp 98.0°F

## 2023-11-04 DIAGNOSIS — L304 Erythema intertrigo: Secondary | ICD-10-CM | POA: Diagnosis not present

## 2023-11-04 DIAGNOSIS — L579 Skin changes due to chronic exposure to nonionizing radiation, unspecified: Secondary | ICD-10-CM | POA: Diagnosis not present

## 2023-11-04 DIAGNOSIS — D0439 Carcinoma in situ of skin of other parts of face: Secondary | ICD-10-CM | POA: Diagnosis not present

## 2023-11-04 DIAGNOSIS — L814 Other melanin hyperpigmentation: Secondary | ICD-10-CM | POA: Diagnosis not present

## 2023-11-04 DIAGNOSIS — C4492 Squamous cell carcinoma of skin, unspecified: Secondary | ICD-10-CM

## 2023-11-04 MED ORDER — MUPIROCIN 2 % EX OINT: 1.0000 | TOPICAL_OINTMENT | Freq: Two times a day (BID) | CUTANEOUS | 2 refills | Status: AC

## 2023-11-04 MED ORDER — NYSTATIN 100000 UNIT/GM EX POWD: 1.0000 | Freq: Three times a day (TID) | CUTANEOUS | 2 refills | Status: AC

## 2023-11-04 NOTE — Patient Instructions (Signed)

## 2023-11-04 NOTE — Progress Notes (Signed)
 Follow-Up Visit   Subjective  Devin [name] is a 83 y.o. 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 Patient has had it previously treated with cryotherapy several times with recurrence. He has had the lesion since at least 2019.   He also has questions about a rash in his groin, that he has previously been treating with clotrimazole /betamethasone . It is active today  The following portions of the chart were reviewed this encounter and updated as appropriate: medications, allergies, medical history  Review of Systems:  No other skin or systemic complaints except as noted in HPI or Assessment and Plan.  Objective  Well appearing patient in no apparent distress; mood and affect are within normal limits.  A focused examination was performed of the following areas: Nose-nasal tip Relevant physical exam findings are noted in the Assessment and Plan.   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 No   Was the anticoagulation regimen changed prior to Mohs? No    Anesthesia: Anesthesia method: local infiltration Local anesthetic: lidocaine  1% WITH epi  Procedure Details: Timeout: pre-procedure verification complete Procedure Prep: patient was prepped and draped in usual sterile fashion Prep type: chlorhexidine Pre-Op diagnosis: squamous cell carcinoma SCC subtype: in situ MohsAIQ Surgical site (if tumor spans multiple areas, please select predominant area): nose Surgical site (from skin exam): nose--nasal tip Pre-operative length (cm): 0.8 Pre-operative width (cm): 0.6 Indications for Mohs surgery: anatomic location where tissue conservation is  critical  Micrographic Surgery Details: Post-operative length (cm): 1.2 Post-operative width (cm): 0.9 Number of Mohs stages: 1 Related Medications mupirocin  ointment (BACTROBAN ) 2 % Apply 1 Application topically 2 (two) times daily. ERYTHEMA INTERTRIGO   Related Medications nystatin  (MYCOSTATIN /NYSTOP ) powder Apply 1 Application topically 3 (three) times daily.  Tinea Cruris Vs Intertrigo- Inguinal Folds The patient was counseled on the diagnosis and management of tinea cruris, a fungal infection commonly affecting the groin area. He was advised to maintain proper hygiene by keeping the area clean and dry, wearing loose-fitting, breathable clothing, and changing undergarments regularly. The importance of avoiding shared personal items and using antifungal powders to reduce moisture and friction was emphasized. The patient was also educated on recognizing signs of worsening infection, such as persistent symptoms, increased redness, or secondary bacterial infection, and was advised to follow up if symptoms do not improve with treatment. Stop clotrimazole /betamethasone  cream Start nystatin  powder TID  Return in about 1 week (around 11/11/2023) for wound check.  LILLETTE Devin [name], CMA, am acting as scribe for RUFUS CHRISTELLA HOLY, MD.    11/04/2023  HISTORY OF PRESENT ILLNESS  Devin [name] is seen in consultation at the request of Dr. 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 They note that the area has been present for about 6 years increasing in size with time with bleeding and failure to LN2 treatment on several occasions.  There is no history of previous treatment.  Reports no other new or changing lesions and has no other complaints today.  Medications and allergies: see patient chart.  Review of systems: Reviewed 8 systems and notable for the above skin cancer.  All other systems reviewed are unremarkable/negative, unless noted in the HPI. Past medical  history, surgical history, family history, social history were also reviewed and are noted in the chart/questionnaire.    PHYSICAL EXAMINATION  General: Well-appearing, in no acute distress, alert and oriented x 4. Vitals reviewed in chart (if available).   Skin: Exam reveals a 0.8 x 0.6 cm erythematous papule and biopsy scar on the nasal tip. There are rhytids, telangiectasias, and lentigines, consistent with photodamage.  Biopsy report(s) reviewed, confirming the diagnosis.   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 Due to location, size, histology, or recurrence and the likelihood of subclinical extension as well as the need to conserve normal surrounding tissue, the patient was deemed acceptable for Mohs micrographic surgery (MMS).  The nature and purpose of the procedure, associated benefits and risks including recurrence and scarring, possible complications such as pain, infection, and bleeding, and alternative methods of treatment if appropriate were discussed with the patient during consent. The lesion location was verified by the patient, by reviewing previous notes, pathology reports, and by photographs as well as angulation measurements if available.  Informed consent was reviewed and signed by the patient, and timeout was performed at 9:00 AM. See op note below.  2. For the photodamage and solar lentigines, sun protection discussed/information given on OTC sunscreens, and we recommend continued regular follow-up with primary dermatologist every 6 months or sooner for any growing, bleeding, or changing lesions. 3. Prognosis and future surveillance discussed. 4. Letter with treatment outcome sent to referring provider. 5. Pain acetaminophen /ibuprofen   MOHS MICROGRAPHIC SURGERY AND RECONSTRUCTION  Initial size:   0.8 x 0.6 cm Surgical defect/wound size: 1.2 x 0.9 cm Anesthesia:    0.33% lidocaine  with  1:200,000 epinephrine EBL:    <5 mL Complications:  None Repair type:   Secondary Intention  Stages: 1  STAGE I: Anesthesia achieved with 0.5% lidocaine  with 1:200,000 epinephrine. ChloraPrep applied. 1 section(s) excised using Mohs technique (this includes total peripheral and deep tissue margin excision and evaluation with frozen sections, excised and interpreted by the same physician). The tumor was first debulked and then excised with an approx. 2mm margin.  Hemostasis was achieved with electrocautery as needed.  The specimen was then oriented, subdivided/relaxed, inked, and processed using Mohs technique.    Frozen section analysis revealed a clear deep and peripheral margin.  Reconstruction  Patient was notified of results and repair options were discussed, including second intention healing. After reviewing the advantages and disadvantages of each, we agreed on second intention healing as appropriate.   The surgical site was then lightly scrubbed with sterile, saline-soaked gauze.  The area was bandaged using Vaseline ointment, non-adherent gauze, gauze pads, and tape to provide an adequate pressure dressing.   The patient tolerated the procedure well, was given detailed written and verbal wound care instructions, and was discharged in good condition.  The patient will follow-up in 8 days and as scheduled with primary dermatologist.   Documentation: I have reviewed the above documentation for accuracy and completeness, and I agree with the above.  RUFUS CHRISTELLA HOLY, MD

## 2023-11-10 ENCOUNTER — Encounter: Payer: Self-pay | Admitting: Dermatology

## 2023-11-10 ENCOUNTER — Ambulatory Visit: Payer: Medicare HMO | Admitting: Physical Therapy

## 2023-11-10 ENCOUNTER — Ambulatory Visit: Payer: Medicare HMO | Admitting: Speech Pathology

## 2023-11-12 ENCOUNTER — Encounter: Payer: Self-pay | Admitting: Dermatology

## 2023-11-12 ENCOUNTER — Ambulatory Visit (INDEPENDENT_AMBULATORY_CARE_PROVIDER_SITE_OTHER): Payer: Medicare HMO | Admitting: Dermatology

## 2023-11-12 VITALS — BP 172/68 | HR 68

## 2023-11-12 DIAGNOSIS — B356 Tinea cruris: Secondary | ICD-10-CM

## 2023-11-12 DIAGNOSIS — L304 Erythema intertrigo: Secondary | ICD-10-CM

## 2023-11-12 DIAGNOSIS — L219 Seborrheic dermatitis, unspecified: Secondary | ICD-10-CM

## 2023-11-12 DIAGNOSIS — Z86007 Personal history of in-situ neoplasm of skin: Secondary | ICD-10-CM | POA: Diagnosis not present

## 2023-11-12 MED ORDER — NYSTATIN 100000 UNIT/GM EX POWD: 1.0000 | Freq: Three times a day (TID) | CUTANEOUS | 5 refills | Status: DC

## 2023-11-12 NOTE — Progress Notes (Signed)
   Follow Up Visit   Subjective  Devin [name] is a 83 y.o. male who presents for the following: follow up from Mohs surgery   The patient presents for follow up from Mohs surgery for a SCCIS on the nasal tip, treated on 11/04/23, repaired with second intention. The patient has been bandaging the wound as directed. The endorse the following concerns: no questions or concerns at this time.   The following portions of the chart were reviewed this encounter and updated as appropriate: medications, allergies, medical history  Review of Systems:  No other skin or systemic complaints except as noted in HPI or Assessment and Plan.  Objective  Well appearing patient in no apparent distress; mood and affect are within normal limits.  A full examination was performed including scalp, head, face and nose. All findings within normal limits unless otherwise noted below.  Healing wound with mild erythema  Relevant physical exam findings are noted in the Assessment and Plan.    Assessment & Plan   Healing s/p Mohs for SCCIS, treated on 11/04/23, healing by second intention - Reassured that wound is healing well - No evidence of infection - No swelling, induration, purulence, dehiscence, or tenderness out of proportion to the clinical exam, see photo above - Discussed that scars take up to 12 months to mature from the date of surgery - Recommend SPF 30+ to scar daily to prevent purple color from UV exposure during scar maturation process - Discussed that erythema and raised appearance of scar will fade over the next 4-6 months - OK to start scar massage at 4-6 weeks post-op - Can consider silicone based products for scar healing starting at 6 weeks post-op - Ok to continue ointment daily to wound under a bandage for another 3-4 weeks or until wound is completely healed.  SEBORRHEIC DERMATITIS Exam: Pink patches with greasy scale at bilateral nasolabial folds  Flared  Seborrheic Dermatitis is a  chronic persistent rash characterized by pinkness and scaling most commonly of the mid face but also can occur on the scalp (dandruff), ears; mid chest, mid back and groin.  It tends to be exacerbated by stress and cooler weather.  People who have neurologic disease may experience new onset or exacerbation of existing seborrheic dermatitis.  The condition is not curable but treatable and can be controlled.  Treatment Plan: - Triamcinolone 0.025% BID to area - Ketoconazole cream 2% BID to area   Tinea Cruris Vs Intertrigo- Inguinal Folds- Improving with nystatin powder The patient was counseled on the diagnosis and management of tinea cruris, a fungal infection commonly affecting the groin area. He was advised to maintain proper hygiene by keeping the area clean and dry, wearing loose-fitting, breathable clothing, and changing undergarments regularly. The importance of avoiding shared personal items and using antifungal powders to reduce moisture and friction was emphasized. The patient was also educated on recognizing signs of worsening infection, such as persistent symptoms, increased redness, or secondary bacterial infection, and was advised to follow up if symptoms do not improve with treatment. - Refill nystatin powder  Return in about 1 week (around 11/19/2023) for wound check.  Dominga Ferry, Surg Tech III, am acting as scribe for Gwenith Daily, MD.   Documentation: I have reviewed the above documentation for accuracy and completeness, and I agree with the above.  Gwenith Daily, MD

## 2023-11-12 NOTE — Patient Instructions (Signed)

## 2023-11-13 ENCOUNTER — Ambulatory Visit: Payer: Medicare HMO | Admitting: Speech Pathology

## 2023-11-16 ENCOUNTER — Other Ambulatory Visit: Payer: Self-pay

## 2023-11-16 DIAGNOSIS — L304 Erythema intertrigo: Secondary | ICD-10-CM

## 2023-11-16 MED ORDER — NYSTATIN 100000 UNIT/GM EX POWD: 1.0000 | Freq: Three times a day (TID) | CUTANEOUS | 5 refills | Status: AC

## 2023-11-17 ENCOUNTER — Encounter: Payer: Self-pay | Admitting: Physical Therapy

## 2023-11-17 ENCOUNTER — Ambulatory Visit: Payer: Medicare HMO | Admitting: Physical Therapy

## 2023-11-17 ENCOUNTER — Ambulatory Visit: Payer: Medicare HMO | Attending: Internal Medicine | Admitting: Speech Pathology

## 2023-11-17 ENCOUNTER — Other Ambulatory Visit: Payer: Self-pay

## 2023-11-17 ENCOUNTER — Encounter: Payer: Self-pay | Admitting: Speech Pathology

## 2023-11-17 DIAGNOSIS — R1312 Dysphagia, oropharyngeal phase: Secondary | ICD-10-CM

## 2023-11-17 DIAGNOSIS — R293 Abnormal posture: Secondary | ICD-10-CM | POA: Insufficient documentation

## 2023-11-17 DIAGNOSIS — M6281 Muscle weakness (generalized): Secondary | ICD-10-CM

## 2023-11-17 DIAGNOSIS — M25612 Stiffness of left shoulder, not elsewhere classified: Secondary | ICD-10-CM

## 2023-11-17 DIAGNOSIS — R471 Dysarthria and anarthria: Secondary | ICD-10-CM | POA: Diagnosis not present

## 2023-11-17 NOTE — Therapy (Signed)
OUTPATIENT PHYSICAL THERAPY SHOULDER EVALUATION   Patient Name: Devin [name] MRN: [id number] DOB:[birth year], 83 y.o., male Today's Date: 11/17/2023  END OF SESSION:  PT End of Session - 11/17/23 1021     Visit Number 1    Number of Visits 1    Authorization Type [employer]    Authorization Time Period 11/17/23 to 11/17/23    PT Start Time 1018    PT Stop Time 1057    PT Time Calculation (min) 39 min    Activity Tolerance Patient tolerated treatment well    Behavior During Therapy WFL for tasks assessed/performed             Past Medical History:  Diagnosis Date   Arthritis    Cataract    COPD (chronic obstructive pulmonary disease) (HCC)    Diverticulosis    pt states he had diverticuli in his bladder also.   H/O [facility] spotted fever 01/27/2014   Hematuria    over 5 years ago   Hernia of abdominal cavity    History of rectal polyps    Hyperplastic colon polyp    Hypertension    Macular degeneration    Rectal cancer (HCC) 1994   Shoulder pain, left    per pt, he can lay on the left side!   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/2023   Patient Active Problem List   Diagnosis Date Noted   Diverticulitis 12/03/2022   Tremor 10/21/2018   Erectile dysfunction 03/08/2018   Greater trochanteric bursitis of both hips 12/11/2017   Back pain    Compression fracture of L2 (HCC) 06/07/2017   Insomnia 10/14/2016   DJD (degenerative joint disease) 10/14/2016   PCP NOTES >>>>>>>>>>>>>>>>>>>>>>>>> 11/28/2015   Benign prostatic hyperplasia with  urinary obstruction 05/30/2015   Annual physical exam 11/27/2014   Essential hypertension 10/02/2014   Alcohol use disorder, mild, abuse 10/02/2014   Bruit 08/09/2014   GERD (gastroesophageal reflux disease) 05/25/2014   History of rectal cancer 05/15/2014   History of colonic polyps 05/15/2014   Diverticulosis of colon 05/15/2014   Family history of malignant neoplasm of gastrointestinal tract 05/15/2014   Anxiety and depression 04/19/2014    PCP: Willow Ora MD   REFERRING PROVIDER: [place] Plump, MD  REFERRING DIAG: Diagnosis [phone] (ICD-10-CM) - Status post replacement of left shoulder joint  THERAPY DIAG:  Stiffness of left shoulder, not elsewhere classified  Muscle weakness (generalized)  Abnormal posture  Rationale for Evaluation and Treatment: Rehabilitation  ONSET DATE: September  15th 2024  SUBJECTIVE:  SUBJECTIVE STATEMENT:   I had reverse total shoulder replacement last September, my range of motion is good but it just feels weak. Had PT after surgery in the [place] but only one session as the health system there is collapsed and it was hard to get one visit. I do have Parkinsons disease. Active with boxing and rowing and cycling to manage my PD.   Hand dominance: Right  PERTINENT HISTORY: See above   PAIN:  Are you having pain? Yes: NPRS scale: 1/10 Pain location: shoulder, hips, back (OA pain) Pain description: ache  Aggravating factors: nothing  Relieving factors: nothing  PRECAUTIONS: Fall  RED FLAGS: None   WEIGHT BEARING RESTRICTIONS: No  FALLS:  Has patient fallen in last 6 months? No  LIVING ENVIRONMENT: Lives with:  lives with ex-spouse  Lives in: House/apartment   OCCUPATION: Retired   PLOF: Independent, Independent with basic ADLs, Independent with gait, and  Independent with transfers  PATIENT GOALS: get HEP to use for my shoulder while I'm in the [place]   NEXT MD VISIT:   OBJECTIVE:  Note: Objective measures were completed at Evaluation unless otherwise noted.    COGNITION: Overall cognitive status: Within functional limits for tasks assessed     SENSATION: Not tested    UPPER EXTREMITY ROM:   Active ROM Right eval Left eval  Shoulder flexion WNL  WNL   Shoulder extension    Shoulder abduction WNL  Mild limitation   Shoulder adduction    Shoulder internal rotation    Shoulder external rotation    Elbow flexion    Elbow extension    Wrist flexion    Wrist extension    Wrist ulnar deviation    Wrist radial deviation    Wrist pronation    Wrist supination    (Blank rows = not tested)  UPPER EXTREMITY MMT:  MMT Right eval Left eval  Shoulder flexion 5 4+  Shoulder extension    Shoulder abduction 5 3+  Shoulder adduction    Shoulder internal rotation 5 4-  Shoulder external rotation 3+ 4  Middle trapezius    Lower trapezius    Elbow flexion    Elbow extension    Wrist flexion    Wrist extension    Wrist ulnar deviation    Wrist radial deviation    Wrist pronation    Wrist supination    Grip strength (lbs)    (Blank rows = not tested)                                                                                                                               TREATMENT DATE:   Eval, HEP assignment (development and practice of all assigned exercises as below)   PATIENT EDUCATION: Education details: exam findings, focus on HEP Person educated: Patient Education method: Programmer, multimedia, Facilities manager, Verbal cues, and Handouts Education comprehension: verbalized understanding and returned demonstration  HOME EXERCISE PROGRAM: Access Code: [email or web address] URL:  [email or web address] Date: 11/17/2023 Prepared by: Nedra Hai  Exercises - Supine Shoulder Flexion with Free Weight  - 1 x daily - 3 x  weekly - 2 sets - 10 reps - Sidelying Full Range Shoulder Abduction with Resistance  - 1 x daily - 3 x weekly - 2 sets - 10 reps - Supine Scapular Protraction in Flexion with Dumbbells  - 1 x daily - 3 x weekly - 2 sets - 10 reps - 2 seconds  hold - Shoulder extension with resistance - Neutral  - 1 x daily - 3 x weekly - 2 sets - 10 reps - 2 seconds  hold - Scapular Retraction with Resistance  - 1 x daily - 3 x weekly - 2 sets - 10 reps - 2 seconds  hold - Standing Cable Shoulder External Rotation   - 1 x daily - 3 x weekly - 2 sets - 10 reps - 2 seconds  hold - Standing Shoulder Internal Rotation Stretch with Towel  - 1 x daily - 7 x weekly - 1 sets - 10 reps - 3-5 seconds  hold  ASSESSMENT:  CLINICAL IMPRESSION: Patient is a 83 y.o. M who was seen today for physical therapy evaluation and treatment for Diagnosis Z96.612 (ICD-10-CM) - Status post replacement of left shoulder joint. Of note he is leaving to the [place] on Sunday and will be gone for 6 months- focus on providing extended HEP for use during his travels. Able to perform all exercises with good form today. If he continues to have shoulder issues, he is welcome to return to PT when he is back in the [place] with new MD order.   OBJECTIVE IMPAIRMENTS: decreased ROM, decreased strength, impaired UE functional use, and postural dysfunction.   ACTIVITY LIMITATIONS: carrying, lifting, toileting, dressing, reach over head, and hygiene/grooming  PARTICIPATION LIMITATIONS: meal prep, cleaning, laundry, driving, and shopping  PERSONAL FACTORS: Age, Fitness, Past/current experiences, and Time since onset of injury/illness/exacerbation are also affecting patient's functional outcome.   REHAB POTENTIAL: Good  CLINICAL DECISION MAKING: Stable/uncomplicated  EVALUATION COMPLEXITY: Low   GOALS: Goals reviewed with patient? No  SHORT TERM GOALS: Target date: 11/17/23   Will be compliant with appropriate HEP to progress shoulder ROM and strength   Baseline: Goal status: MET 11/17/23   2.  All questions and concerned regarding his shoulder to have been addressed to his satisfaction Baseline:  Goal status: MET 11/17/23       LONG TERM GOALS: Target date: n/a one time visit     PLAN:  PT FREQUENCY: one time visit  PT DURATION: other: eval only   PLANNED INTERVENTIONS: 97110-Therapeutic exercises and 33295- Self Care  PLAN FOR NEXT SESSION: n/a one time visit for advanced HEP   Nedra Hai, PT, DPT 11/17/23 11:04 AM

## 2023-11-18 ENCOUNTER — Encounter: Payer: Self-pay | Admitting: Speech Pathology

## 2023-11-18 NOTE — Therapy (Signed)
OUTPATIENT SPEECH LANGUAGE PATHOLOGY LSVT LOUD TREATMENT AND DISCHARGE   Patient Name: Devin [name] MRN: [id number] DOB:[birth year], 83 y.o., male Today's Date: 11/18/2023  PCP: [place] Plump MD REFERRING PROVIDER: [place] Plump MD  SPEECH THERAPY DISCHARGE SUMMARY  Visits from Start of Care: 2  Current functional level related to goals / functional outcomes: No functional outcomes as pt was only seen for evaluation and today's tx/dc.    Remaining deficits: Voice and Swallowing   Education / Equipment: Initiated.    Patient agrees to discharge. Patient goals were not met. Patient is being discharged due to not returning since the last visit.;and leaving for [place] this weekend.    END OF SESSION:  End of Session - 11/17/23 0931     Visit Number 2    Number of Visits 6    Date for SLP Re-Evaluation 11/23/23    SLP Start Time 0930    SLP Stop Time  1010    SLP Time Calculation (min) 40 min    Activity Tolerance Patient tolerated treatment well             Past Medical History:  Diagnosis Date   Arthritis    Cataract    COPD (chronic obstructive pulmonary disease) (HCC)    Diverticulosis    pt states he had diverticuli in his bladder also.   H/O [facility] spotted fever 01/27/2014   Hematuria    over 5 years ago   Hernia of abdominal cavity    History of rectal polyps    Hyperplastic colon polyp    Hypertension    Macular degeneration    Rectal cancer (HCC) 1994   Shoulder pain, left    per pt, he can lay on the left side!   Tremor 10/21/2018   Tubular adenoma    01/05/2013   Past Surgical History:  Procedure Laterality Date   APPENDECTOMY     COLON SURGERY     rectal cancer ~ 1999   EYE SURGERY     cataract sx   HERNIA REPAIR     IR KYPHO LUMBAR INC FX REDUCE BONE BX UNI/BIL CANNULATION INC/IMAGING  06/12/2017   IR RADIOLOGIST EVAL & MGMT  08/06/2017   mastoid gland removal     left ear   PROSTATE SURGERY  05/2015   TONSILLECTOMY     TOTAL  SHOULDER REPLACEMENT Left 06/14/2023   Patient Active Problem List   Diagnosis Date Noted   Diverticulitis 12/03/2022   Tremor 10/21/2018   Erectile dysfunction 03/08/2018   Greater trochanteric bursitis of both hips 12/11/2017   Back pain    Compression fracture of L2 (HCC) 06/07/2017   Insomnia 10/14/2016   DJD (degenerative joint disease) 10/14/2016   PCP NOTES >>>>>>>>>>>>>>>>>>>>>>>>> 11/28/2015   Benign prostatic hyperplasia with urinary obstruction 05/30/2015   Annual physical exam 11/27/2014   Essential hypertension 10/02/2014   Alcohol use disorder, mild, abuse 10/02/2014   Bruit 08/09/2014   GERD (gastroesophageal reflux disease) 05/25/2014   History of rectal cancer 05/15/2014   History of colonic polyps 05/15/2014   Diverticulosis of colon 05/15/2014   Family history of malignant neoplasm of gastrointestinal tract 05/15/2014   Anxiety and depression 04/19/2014    Onset date: Referred on 10/20/23  REFERRING DIAG: G20.A1 (ICD-10-CM) - Parkinson's disease, unspecified whether dyskinesia present, unspecified whether manifestations fluctuate (HCC)  THERAPY DIAG:  Dysarthria and anarthria  Oropharyngeal dysphagia  Rationale for Evaluation and Treatment: Rehabilitation  SUBJECTIVE:   SUBJECTIVE STATEMENT: Pt was  pleasant and cooperative throughout  Pt accompanied by: self  PERTINENT HISTORY: PD  PAIN:  Are you having pain? No  FALLS: Has patient fallen in last 6 months? No, Number of falls:   LIVING ENVIRONMENT: Lives with:  ex-wife  Lives in: House/apartment  PLOF: Independent  PATIENT GOALS:   OBJECTIVE:  Note: Objective measures were completed at Evaluation unless otherwise noted.  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  Water intake: optimal; 4L/day Caffeine/alcohol intake: moderate; 4 (coffee/tea), 0-1 drinks per evening (drinks more in [place]) Daily voice use: minimal  PERCEPTUAL VOICE ASSESSMENT: Voice quality:  - ; describes voice as "weak" "I am scared to have a conversation"  Vocal abuse:  NA Resonance: normal Respiratory function: thoracic breathing; lifting shoulders   LSVT-LOUD VOICE EVALUATION: Maximum phonation time for sustained "ah": 20 Mean intensity during sustained "ah": 71 dB  Mean fundamental frequency during sustained "ah": 97 Hz  145 Hz +/- 35for age and gender) Mean intensity sustained during conversational speech: 75 dB Habitual pitch: 100 Hz Average time patient was able to sustain /s/: 15 seconds Average time patient was able to sustain /z/: 22 seconds s/z ratio : .6  Speech is characterized by Fast rate, Monoloudness, and Hypophonia. For trained listener in quiet environment with context, intelligibility was 90% .   Patient able to improve all parameters with model ("Loud like me") Stimulability: Improved vocal quality with loud voice (89 dB) for sustained vowel, maximum high and low phonation improved to range 11.   The Communication Effectiveness Survey is a patient-reported outcome measure in which the patient rates their own effectiveness in different communication situations. A higher score indicates greater effectiveness. Pt's self-rating was -/32. Patient reported -. *NOT COMPLETED THIS DATE DUE TO TIME CONSTRAINTS  **SWALLOWING WAS NOT ASSESSED THIS DATE DUE TO TIME CONSTRAINTS. TO FURTHER ASSESS NEXT SESSION TO PROVIDE APPROPRIATE RECOMMENDATIONS AT NEXT LEVEL OF CARE.                                                                                                                           TREATMENT DATE:   11/18/23: Pt was seen for skilled ST services targeting initial education on PD, voice and swallowing.   Patient had not returned to therapy reportedly due to " transportation and family problems".  Patient reports he is to leave for [place] on Sunday.  SLP explained the importance of following up with a doctor once he is in [place] so he is able to ask for a speech therapy referral.  SLP provided patient with education handouts on re: LSVT, vocal hygiene, and safe swallow strategies. Pt was observed with consecutive sips of water - x1 cough. SLP would recommend a modified barium swallow. SLP educated on  re: taking small sips and bites, sitting up while eating, decreasing distractions while eating. SLP provided him with visual reminder to ask doctor for referral to ST due to swallowing and voice concerns. To discharge this date, as pt will no longer be in the [place].    PATIENT EDUCATION: Education details: PD and SLP Role Person educated: Patient Education method: Explanation Education comprehension: verbalized understanding and needs further education  HOME EXERCISE PROGRAM:   GOALS: Goals reviewed with patient? Yes    LONG TERM GOALS: Target date: 11/23/23  Pt will demonstrate knowledge of common impairments associated with PD to feel comfortable monitoring sx for doctor report.  Baseline:  Goal status: NOT MET  2.  Pt will demonstrate basic knowledge of voice exercises for continuation once he returns to [place] Baseline:  Goal status: NOT MET  3.  Pt will recall safe swallow strategies independently  Baseline:  Goal status: NOT MET   ASSESSMENT:  CLINICAL IMPRESSION:  Pt is a 83yo male who presented to ST OP for evaluation with complaints of voice change and swallowing impairment.  SEE TX NOTE. SLP recommends follow up with SLP services when he arrives in [place]. He understands he is to ask his doctor for the referral. Pt to d/c.       OBJECTIVE IMPAIRMENTS: include dysarthria and dysphagia. These impairments are limiting patient from effectively communicating at home and in  community and safety when swallowing. Factors affecting potential to achieve goals and functional outcome are  : pt is to leave to return to [place] in 1 month .Marland Kitchen Patient will benefit from skilled SLP services to address above impairments and improve overall function.  REHAB POTENTIAL: Fair due to limited time spent with pt before he returns to [place].  PLAN:  SLP FREQUENCY: 1-2x/week  SLP DURATION: 4 weeks  PLANNED INTERVENTIONS: Aspiration precaution training, SLP instruction and feedback, Compensatory strategies, Patient/family education, [phone] Treatment of speech (30 or 45 min) , and [id number] Treatment of swallowing function    [employer], CCC-SLP 11/18/2023, 9:55 AM

## 2023-11-19 ENCOUNTER — Other Ambulatory Visit: Payer: Self-pay | Admitting: Internal Medicine

## 2023-11-19 ENCOUNTER — Ambulatory Visit: Payer: Medicare HMO | Admitting: Dermatology

## 2023-11-19 NOTE — Telephone Encounter (Signed)
Copied from CRM [id number]. Topic: Clinical - Medication Refill >> Nov 19, 2023 10:44 AM Lennart Pall wrote: Most Recent Primary Care Visit:  Provider: Willow Ora E  Department: [facility]  Visit Type: OFFICE VISIT  Date: 09/15/2023  Medication: carbidopa-levodopa (SINEMET IR) 25-100 MG tablet  Has the patient contacted their pharmacy? Yes (Agent: If no, request that the patient contact the pharmacy for the refill. If patient does not wish to contact the pharmacy document the reason why and proceed with request.) (Agent: If yes, when and what did the pharmacy advise?)  Is this the correct pharmacy for this prescription? Yes If no, delete pharmacy and type the correct one.  This is the patient's preferred pharmacy:   [facility] [address] - [place], [place] - [address], [address] [place] [address] Phone: [phone] Fax: [phone]   Has the prescription been filled recently? Yes  Is the patient out of the medication? Yes  Has the patient been seen for an appointment in the last year OR does the patient have an upcoming appointment? Yes  Can we respond through MyChart? Yes  Agent: Please be advised that Rx refills may take up to 3 business days. We ask that you follow-up with your pharmacy.

## 2023-11-20 MED ORDER — CARBIDOPA-LEVODOPA 25-100 MG PO TABS: 1.0000 | ORAL_TABLET | Freq: Three times a day (TID) | ORAL | 0 refills | Status: AC

## 2023-11-20 NOTE — Telephone Encounter (Signed)
Spoke w/ Pt- informed of refills and need to see new neurologist- Pt is leaving to go to [place] for 6 months on Sunday, 2/23. He will f/u on this once he returns in November.

## 2023-11-20 NOTE — Telephone Encounter (Signed)
Please advise? He was dismissed from Dr. Don Perking office.

## 2023-11-20 NOTE — Telephone Encounter (Signed)
Advise patient. Okay to refill carbidopa-levodopa x 3 months. He needs to find a new neurologist, I believe we already placed a referral.

## 2024-05-30 DEATH — deceased

## 2024-07-07 ENCOUNTER — Telehealth: Payer: Self-pay | Admitting: Internal Medicine

## 2024-07-07 NOTE — Telephone Encounter (Signed)
 Copied from CRM [id number]. Topic: Medicare AWV >> Jul 07, 2024 10:14 AM Nathanel DEL wrote: Reason for CRM: Called 07/07/2024 to sched AWV - NO VOICEMAIL  Nathanel Paschal; Care Guide Ambulatory Clinical Support [place] [facility] Direct Dial: [phone]
